# Patient Record
Sex: Female | Born: 1950 | Race: White | Hispanic: No | Smoking: Never smoker
Health system: Southern US, Community
[De-identification: ages and names within clinical notes are randomized; demographics above are authoritative.]

## PROBLEM LIST (undated history)

## (undated) DIAGNOSIS — E119 Type 2 diabetes mellitus without complications: Secondary | ICD-10-CM

## (undated) DIAGNOSIS — N200 Calculus of kidney: Secondary | ICD-10-CM

## (undated) DIAGNOSIS — Z8719 Personal history of other diseases of the digestive system: Secondary | ICD-10-CM

## (undated) DIAGNOSIS — E7800 Pure hypercholesterolemia, unspecified: Secondary | ICD-10-CM

## (undated) DIAGNOSIS — E789 Disorder of lipoprotein metabolism, unspecified: Secondary | ICD-10-CM

## (undated) DIAGNOSIS — I509 Heart failure, unspecified: Secondary | ICD-10-CM

## (undated) DIAGNOSIS — T7840XA Allergy, unspecified, initial encounter: Secondary | ICD-10-CM

## (undated) DIAGNOSIS — E785 Hyperlipidemia, unspecified: Secondary | ICD-10-CM

## (undated) DIAGNOSIS — E559 Vitamin D deficiency, unspecified: Secondary | ICD-10-CM

## (undated) DIAGNOSIS — D509 Iron deficiency anemia, unspecified: Secondary | ICD-10-CM

## (undated) DIAGNOSIS — I4510 Unspecified right bundle-branch block: Secondary | ICD-10-CM

## (undated) DIAGNOSIS — Z87442 Personal history of urinary calculi: Secondary | ICD-10-CM

## (undated) DIAGNOSIS — N182 Chronic kidney disease, stage 2 (mild): Secondary | ICD-10-CM

## (undated) DIAGNOSIS — K219 Gastro-esophageal reflux disease without esophagitis: Secondary | ICD-10-CM

## (undated) DIAGNOSIS — N1330 Unspecified hydronephrosis: Secondary | ICD-10-CM

## (undated) HISTORY — DX: Allergy, unspecified, initial encounter: T78.40XA

## (undated) HISTORY — DX: Gastro-esophageal reflux disease without esophagitis: K21.9

## (undated) HISTORY — DX: Hyperlipidemia, unspecified: E78.5

## (undated) HISTORY — PX: COLONOSCOPY: SHX174

## (undated) HISTORY — PX: ESOPHAGOGASTRODUODENOSCOPY: SHX1529

## (undated) HISTORY — DX: Type 2 diabetes mellitus without complications: E11.9

---
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
## 1978-12-20 HISTORY — PX: ABDOMINAL HYSTERECTOMY: SHX81

## 1999-11-06 ENCOUNTER — Other Ambulatory Visit: Admission: RE | Admit: 1999-11-06 | Discharge: 1999-11-06 | Payer: Self-pay | Admitting: Internal Medicine

## 1999-11-09 ENCOUNTER — Encounter: Payer: Self-pay | Admitting: Internal Medicine

## 1999-11-09 ENCOUNTER — Encounter: Admission: RE | Admit: 1999-11-09 | Discharge: 1999-11-09 | Payer: Self-pay | Admitting: Internal Medicine

## 2001-10-27 ENCOUNTER — Encounter: Admission: RE | Admit: 2001-10-27 | Discharge: 2001-10-27 | Payer: Self-pay | Admitting: General Surgery

## 2001-10-27 ENCOUNTER — Encounter: Payer: Self-pay | Admitting: General Surgery

## 2002-08-17 ENCOUNTER — Ambulatory Visit (HOSPITAL_BASED_OUTPATIENT_CLINIC_OR_DEPARTMENT_OTHER): Admission: RE | Admit: 2002-08-17 | Discharge: 2002-08-17 | Payer: Self-pay

## 2003-08-05 ENCOUNTER — Encounter: Payer: Self-pay | Admitting: General Surgery

## 2003-08-05 ENCOUNTER — Encounter: Admission: RE | Admit: 2003-08-05 | Discharge: 2003-08-05 | Payer: Self-pay | Admitting: General Surgery

## 2004-11-27 ENCOUNTER — Encounter: Admission: RE | Admit: 2004-11-27 | Discharge: 2004-11-27 | Payer: Self-pay | Admitting: General Surgery

## 2005-01-28 ENCOUNTER — Ambulatory Visit (HOSPITAL_COMMUNITY): Admission: RE | Admit: 2005-01-28 | Discharge: 2005-01-28 | Payer: Self-pay | Admitting: Gastroenterology

## 2005-12-29 ENCOUNTER — Encounter: Admission: RE | Admit: 2005-12-29 | Discharge: 2005-12-29 | Payer: Self-pay | Admitting: General Surgery

## 2007-02-01 ENCOUNTER — Encounter: Admission: RE | Admit: 2007-02-01 | Discharge: 2007-02-01 | Payer: Self-pay | Admitting: Internal Medicine

## 2008-02-26 ENCOUNTER — Ambulatory Visit: Payer: Self-pay | Admitting: Internal Medicine

## 2009-03-27 ENCOUNTER — Ambulatory Visit: Payer: Self-pay | Admitting: Internal Medicine

## 2010-04-02 ENCOUNTER — Ambulatory Visit: Payer: Self-pay | Admitting: Internal Medicine

## 2010-07-13 ENCOUNTER — Ambulatory Visit: Payer: Self-pay | Admitting: Gastroenterology

## 2010-07-14 LAB — PATHOLOGY REPORT

## 2011-05-11 ENCOUNTER — Ambulatory Visit: Payer: Self-pay | Admitting: Family Medicine

## 2011-07-03 ENCOUNTER — Emergency Department: Payer: Self-pay | Admitting: Emergency Medicine

## 2013-08-07 LAB — LIPID PANEL
Cholesterol: 241 mg/dL — AB (ref 0–200)
HDL: 50 mg/dL (ref 35–70)
LDL Cholesterol: 159 mg/dL
Triglycerides: 161 mg/dL — AB (ref 40–160)

## 2013-09-25 ENCOUNTER — Ambulatory Visit: Payer: Self-pay | Admitting: Family Medicine

## 2013-10-31 ENCOUNTER — Ambulatory Visit: Payer: Self-pay | Admitting: Gastroenterology

## 2013-10-31 LAB — HM COLONOSCOPY

## 2013-11-12 ENCOUNTER — Ambulatory Visit: Payer: Self-pay | Admitting: Gastroenterology

## 2014-08-07 LAB — HM MAMMOGRAPHY

## 2015-03-13 LAB — HEMOGLOBIN A1C: HEMOGLOBIN A1C: 6.4 % — AB (ref 4.0–6.0)

## 2015-05-06 DIAGNOSIS — Z87442 Personal history of urinary calculi: Secondary | ICD-10-CM | POA: Insufficient documentation

## 2015-05-06 DIAGNOSIS — J309 Allergic rhinitis, unspecified: Secondary | ICD-10-CM | POA: Insufficient documentation

## 2015-05-06 DIAGNOSIS — D369 Benign neoplasm, unspecified site: Secondary | ICD-10-CM | POA: Insufficient documentation

## 2015-05-06 DIAGNOSIS — Z860100 Personal history of colon polyps, unspecified: Secondary | ICD-10-CM

## 2015-05-06 DIAGNOSIS — K219 Gastro-esophageal reflux disease without esophagitis: Secondary | ICD-10-CM | POA: Insufficient documentation

## 2015-05-06 DIAGNOSIS — E7800 Pure hypercholesterolemia, unspecified: Secondary | ICD-10-CM | POA: Insufficient documentation

## 2015-05-06 DIAGNOSIS — M7710 Lateral epicondylitis, unspecified elbow: Secondary | ICD-10-CM | POA: Insufficient documentation

## 2015-05-06 DIAGNOSIS — D709 Neutropenia, unspecified: Secondary | ICD-10-CM | POA: Insufficient documentation

## 2015-05-06 DIAGNOSIS — I499 Cardiac arrhythmia, unspecified: Secondary | ICD-10-CM | POA: Insufficient documentation

## 2015-05-06 DIAGNOSIS — E119 Type 2 diabetes mellitus without complications: Secondary | ICD-10-CM | POA: Insufficient documentation

## 2015-05-06 HISTORY — DX: Personal history of colon polyps, unspecified: Z86.0100

## 2015-05-06 HISTORY — DX: Benign neoplasm, unspecified site: D36.9

## 2015-05-06 HISTORY — DX: Personal history of colonic polyps: Z86.010

## 2015-07-10 ENCOUNTER — Encounter: Payer: Self-pay | Admitting: Family Medicine

## 2015-07-10 ENCOUNTER — Ambulatory Visit (INDEPENDENT_AMBULATORY_CARE_PROVIDER_SITE_OTHER): Payer: 59 | Admitting: Family Medicine

## 2015-07-10 VITALS — BP 102/60 | HR 80 | Temp 98.3°F | Resp 16 | Wt 131.0 lb

## 2015-07-10 DIAGNOSIS — E119 Type 2 diabetes mellitus without complications: Secondary | ICD-10-CM

## 2015-07-10 DIAGNOSIS — D709 Neutropenia, unspecified: Secondary | ICD-10-CM

## 2015-07-10 DIAGNOSIS — R319 Hematuria, unspecified: Secondary | ICD-10-CM

## 2015-07-10 DIAGNOSIS — E7800 Pure hypercholesterolemia, unspecified: Secondary | ICD-10-CM

## 2015-07-10 DIAGNOSIS — J309 Allergic rhinitis, unspecified: Secondary | ICD-10-CM

## 2015-07-10 DIAGNOSIS — Z0000 Encounter for general adult medical examination without abnormal findings: Secondary | ICD-10-CM

## 2015-07-10 LAB — POCT URINALYSIS DIPSTICK
Bilirubin, UA: NEGATIVE
GLUCOSE UA: NEGATIVE
Ketones, UA: NEGATIVE
Leukocytes, UA: NEGATIVE
Nitrite, UA: NEGATIVE
PH UA: 5
PROTEIN UA: NEGATIVE
Spec Grav, UA: 1.025
UROBILINOGEN UA: 0.2

## 2015-07-10 LAB — POCT UA - MICROALBUMIN: Microalbumin Ur, POC: 20 mg/L

## 2015-07-10 NOTE — Progress Notes (Signed)
Patient ID: Marcellus Scott, female   DOB: 20-Feb-1951, 64 y.o.   MRN: [id number]        Patient: OAKLYNN STIERWALT, Female    DOB: [birth year], 64 y.o.   MRN: [id number] Visit Date: 07/10/2015  Today's Provider: Margarita Rana, MD   Chief Complaint  Patient presents with  . Annual Exam   Subjective:    Annual physical exam BRIENA SWINGLER is a 64 y.o. female who presents today for health maintenance and complete physical. She feels well. She reports exercising some. She reports she is sleeping fairly well.  -----------------------------------------------------------------  Patient Active Problem List   Diagnosis Date Noted  . Allergic rhinitis 05/06/2015  . Acid reflux 05/06/2015  . History of colon polyps 05/06/2015  . H/O renal calculi 05/06/2015  . Hypercholesteremia 05/06/2015  . Irregular cardiac rhythm 05/06/2015  . Backhand tennis elbow 05/06/2015  . Neutropenia 05/06/2015  . Diabetes mellitus, type 2 05/06/2015  . Adenomatous polyp 05/06/2015   Family History  Problem Relation Age of Onset  . Diabetes Son   . Healthy Father   . Hypertension Father   . Healthy Sister   . Healthy Brother   . Colon cancer Maternal Aunt   . Healthy Sister    History   Social History  . Marital Status: Widowed    Spouse Name: N/A  . Number of Children: 2  . Years of Education: College   Occupational History  .      Part-Time   Social History Main Topics  . Smoking status: Never Smoker   . Smokeless tobacco: Never Used  . Alcohol Use: No  . Drug Use: No  . Sexual Activity: Not on file   Other Topics Concern  . Not on file   Social History Narrative   Past Surgical History  Procedure Laterality Date  . Abdominal hysterectomy  1980  . Appendectomy  1967   Allergies  Allergen Reactions  . Codeine    Previous Medications   OMEPRAZOLE (PRILOSEC) 20 MG CAPSULE    Take by mouth.   BP 102/60 mmHg  Pulse 80  Temp(Src) 98.3 F (36.8 C) (Oral)  Resp 16  Wt 131 lb  (59.421 kg)   Review of Systems  Constitutional: Negative.   HENT: Negative.   Eyes: Negative.   Respiratory: Negative.   Cardiovascular: Negative.   Gastrointestinal: Negative.   Endocrine: Negative.   Genitourinary: Negative.   Musculoskeletal: Negative.   Skin: Negative.   Allergic/Immunologic: Negative.   Neurological: Negative.   Hematological: Negative.   Psychiatric/Behavioral: Negative.     Social History She  reports that she has never smoked. She has never used smokeless tobacco. She reports that she does not drink alcohol or use illicit drugs.  Patient Active Problem List   Diagnosis Date Noted  . Allergic rhinitis 05/06/2015  . Acid reflux 05/06/2015  . History of colon polyps 05/06/2015  . H/O renal calculi 05/06/2015  . Hypercholesteremia 05/06/2015  . Irregular cardiac rhythm 05/06/2015  . Backhand tennis elbow 05/06/2015  . Neutropenia 05/06/2015  . Diabetes mellitus, type 2 05/06/2015  . Adenomatous polyp 05/06/2015    Past Surgical History  Procedure Laterality Date  . Abdominal hysterectomy  1980  . Appendectomy  1967    Family History Her family history includes Colon cancer in her maternal aunt; Diabetes in her son; Healthy in her brother, father, sister, and sister; Hypertension in her father.    Allergies  Allergen Reactions  . Codeine  Previous Medications   OMEPRAZOLE (PRILOSEC) 20 MG CAPSULE    Take by mouth.    Patient Care Team: Margarita Rana, MD as PCP - General (Family Medicine)     Objective:   Vitals: BP 102/60 mmHg  Pulse 80  Temp(Src) 98.3 F (36.8 C) (Oral)  Resp 16  Wt 131 lb (59.421 kg)   Results for orders placed or performed in visit on 07/10/15  POCT urinalysis dipstick  Result Value Ref Range   Color, UA     Clarity, UA     Glucose, UA Negative    Bilirubin, UA Negative    Ketones, UA Negative    Spec Grav, UA 1.025    Blood, UA Small    pH, UA 5.0    Protein, UA Negative    Urobilinogen, UA  0.2    Nitrite, UA Negative    Leukocytes, UA Negative Negative  POCT UA - Microalbumin  Result Value Ref Range   Microalbumin Ur, POC 20 mg/L     Physical Exam  Constitutional: She is oriented to person, place, and time. She appears well-developed and well-nourished.  HENT:  Head: Normocephalic and atraumatic.  Right Ear: Tympanic membrane, external ear and ear canal normal.  Left Ear: Tympanic membrane, external ear and ear canal normal.  Nose: Nose normal.  Mouth/Throat: Uvula is midline, oropharynx is clear and moist and mucous membranes are normal.  Eyes: Conjunctivae, EOM and lids are normal. Pupils are equal, round, and reactive to light.  Neck: Trachea normal and normal range of motion. Neck supple. Carotid bruit is not present. No thyroid mass and no thyromegaly present.  Cardiovascular: Normal rate, regular rhythm and normal heart sounds.   Pulmonary/Chest: Effort normal and breath sounds normal.  Abdominal: Soft. Normal appearance and bowel sounds are normal. There is no hepatosplenomegaly. There is no tenderness.  Genitourinary: No breast swelling, tenderness or discharge.  Musculoskeletal: Normal range of motion.  Lymphadenopathy:    She has no cervical adenopathy.    She has no axillary adenopathy.  Neurological: She is alert and oriented to person, place, and time. She has normal strength. No cranial nerve deficit.  Skin: Skin is warm, dry and intact.  Psychiatric: She has a normal mood and affect. Her speech is normal and behavior is normal. Judgment and thought content normal. Cognition and memory are normal.   BP 102/60 mmHg  Pulse 80  Temp(Src) 98.3 F (36.8 C) (Oral)  Resp 16  Wt 131 lb (59.421 kg)    Depression Screen No flowsheet data found.    Assessment & Plan:     Routine Health Maintenance and Physical Exam  Exercise Activities and Dietary recommendations Goals    None       There is no immunization history on file for this  patient.  Health Maintenance  Topic Date Due  . FOOT EXAM  02/16/1961  . URINE MICROALBUMIN  02/16/1961  . TETANUS/TDAP  02/16/1970  . ZOSTAVAX  02/17/2011  . OPHTHALMOLOGY EXAM  07/20/2015  . INFLUENZA VACCINE  07/21/2015  . HEMOGLOBIN A1C  09/13/2015  . MAMMOGRAM  08/07/2016  . COLONOSCOPY  11/01/2023  . HIV Screening  Completed      Discussed health benefits of physical activity, and encouraged her to engage in regular exercise appropriate for her age and condition.    -------------------------------------------------------------------- Patient was seen and examined by Jerrell Belfast, MD, and note scribed by Ashley Royalty, [place].  I have reviewed the document for accuracy and completeness  and I agree with above. Jerrell Belfast, MD   Margarita Rana, MD

## 2015-07-11 LAB — URINALYSIS, ROUTINE W REFLEX MICROSCOPIC
BILIRUBIN UA: NEGATIVE
Glucose, UA: NEGATIVE
KETONES UA: NEGATIVE
Leukocytes, UA: NEGATIVE
Nitrite, UA: NEGATIVE
PH UA: 6 (ref 5.0–7.5)
PROTEIN UA: NEGATIVE
RBC UA: NEGATIVE
Specific Gravity, UA: 1.029 (ref 1.005–1.030)
Urobilinogen, Ur: 0.2 mg/dL (ref 0.2–1.0)

## 2015-07-12 LAB — CBC WITH DIFFERENTIAL/PLATELET
Basophils Absolute: 0 10*3/uL (ref 0.0–0.2)
Basos: 1 %
EOS (ABSOLUTE): 0.1 10*3/uL (ref 0.0–0.4)
Eos: 2 %
Hematocrit: 38.3 % (ref 34.0–46.6)
Hemoglobin: 12.5 g/dL (ref 11.1–15.9)
IMMATURE GRANULOCYTES: 0 %
Immature Grans (Abs): 0 10*3/uL (ref 0.0–0.1)
LYMPHS ABS: 1.5 10*3/uL (ref 0.7–3.1)
Lymphs: 45 %
MCH: 27.8 pg (ref 26.6–33.0)
MCHC: 32.6 g/dL (ref 31.5–35.7)
MCV: 85 fL (ref 79–97)
Monocytes Absolute: 0.3 10*3/uL (ref 0.1–0.9)
Monocytes: 9 %
NEUTROS PCT: 43 %
Neutrophils Absolute: 1.5 10*3/uL (ref 1.4–7.0)
PLATELETS: 243 10*3/uL (ref 150–379)
RBC: 4.49 x10E6/uL (ref 3.77–5.28)
RDW: 14.8 % (ref 12.3–15.4)
WBC: 3.4 10*3/uL (ref 3.4–10.8)

## 2015-07-12 LAB — LIPID PANEL
CHOLESTEROL TOTAL: 277 mg/dL — AB (ref 100–199)
Chol/HDL Ratio: 5.5 ratio units — ABNORMAL HIGH (ref 0.0–4.4)
HDL: 50 mg/dL (ref >39)
LDL Calculated: 190 mg/dL — ABNORMAL HIGH (ref 0–99)
TRIGLYCERIDES: 185 mg/dL — AB (ref 0–149)
VLDL Cholesterol Cal: 37 mg/dL (ref 5–40)

## 2015-07-12 LAB — COMPREHENSIVE METABOLIC PANEL
A/G RATIO: 1.3 (ref 1.1–2.5)
ALK PHOS: 53 IU/L (ref 39–117)
ALT: 10 IU/L (ref 0–32)
AST: 10 IU/L (ref 0–40)
Albumin: 4.2 g/dL (ref 3.6–4.8)
BUN/Creatinine Ratio: 23 (ref 11–26)
BUN: 18 mg/dL (ref 8–27)
Bilirubin Total: 0.3 mg/dL (ref 0.0–1.2)
CO2: 22 mmol/L (ref 18–29)
Calcium: 9.5 mg/dL (ref 8.7–10.3)
Chloride: 99 mmol/L (ref 97–108)
Creatinine, Ser: 0.78 mg/dL (ref 0.57–1.00)
GFR calc non Af Amer: 81 mL/min/{1.73_m2} (ref >59)
GFR, EST AFRICAN AMERICAN: 93 mL/min/{1.73_m2} (ref >59)
GLOBULIN, TOTAL: 3.3 g/dL (ref 1.5–4.5)
Glucose: 102 mg/dL — ABNORMAL HIGH (ref 65–99)
Potassium: 4.3 mmol/L (ref 3.5–5.2)
SODIUM: 139 mmol/L (ref 134–144)
Total Protein: 7.5 g/dL (ref 6.0–8.5)

## 2015-07-12 LAB — TSH: TSH: 1.57 u[IU]/mL (ref 0.450–4.500)

## 2015-07-12 LAB — HEMOGLOBIN A1C
Est. average glucose Bld gHb Est-mCnc: 140 mg/dL
Hgb A1c MFr Bld: 6.5 % — ABNORMAL HIGH (ref 4.8–5.6)

## 2015-07-14 ENCOUNTER — Telehealth: Payer: Self-pay

## 2015-07-14 NOTE — Telephone Encounter (Signed)
Advised pt of lab results. Pt verbally acknowledges understanding. Pt declines Statin at this time, will try OTC Fish Oil 2000mg  po bid, then recheck in 6 weeks. Pt also will work on diet and exercise. [name], CMA '

## 2015-07-14 NOTE — Telephone Encounter (Signed)
-----   Message from Margarita Rana, MD sent at 07/14/2015 10:50 AM EDT ----- Labs stable. Blood sugar at 6.5. Slightly higher than previously.  Cholesterol is 277.  Higher than previously. Now patient had trouble with statins in the past.  Please see if patient would like to try a mild statin like Pravastatin and recheck 6 weeks. Thanks.

## 2015-10-29 ENCOUNTER — Encounter: Payer: Self-pay | Admitting: Family Medicine

## 2015-10-29 ENCOUNTER — Ambulatory Visit
Admission: RE | Admit: 2015-10-29 | Discharge: 2015-10-29 | Disposition: A | Discharge status: Home / Self Care | Payer: 59 | Source: Ambulatory Visit | Attending: Family Medicine | Admitting: Family Medicine

## 2015-10-29 ENCOUNTER — Ambulatory Visit (INDEPENDENT_AMBULATORY_CARE_PROVIDER_SITE_OTHER): Payer: 59 | Admitting: Family Medicine

## 2015-10-29 VITALS — BP 102/70 | HR 80 | Temp 97.8°F | Resp 16 | Ht 61.0 in | Wt 131.0 lb

## 2015-10-29 DIAGNOSIS — R0989 Other specified symptoms and signs involving the circulatory and respiratory systems: Secondary | ICD-10-CM | POA: Diagnosis not present

## 2015-10-29 DIAGNOSIS — R0781 Pleurodynia: Secondary | ICD-10-CM

## 2015-10-29 DIAGNOSIS — R918 Other nonspecific abnormal finding of lung field: Secondary | ICD-10-CM | POA: Insufficient documentation

## 2015-10-29 MED ORDER — MELOXICAM 15 MG PO TABS: 15.0000 mg | ORAL_TABLET | Freq: Every day | ORAL | Status: DC

## 2015-10-29 NOTE — Progress Notes (Signed)
Patient ID: Holly Matthews, female   DOB: 17-Jan-1951, 64 y.o.   MRN: [id number]        Patient: Holly Matthews Female    DOB: 1951/12/13   64 y.o.   MRN: [id number] Visit Date: 10/29/2015  Today's Provider: Margarita Rana, MD   Chief Complaint  Patient presents with  . Flank Pain   Subjective:    Flank Pain This is a new problem. The current episode started in the past 7 days. The problem occurs constantly. The problem has been gradually worsening since onset. Pain location: right side of torso. The quality of the pain is described as aching. The pain does not radiate. The pain is at a severity of 2/10 (7-8 "when pushed on it"). The pain is mild. The pain is the same all the time. Exacerbated by: Laying down.  Pertinent negatives include no abdominal pain, dysuria, fever, leg pain, numbness, paresthesias or pelvic pain. She has tried NSAIDs for the symptoms. The treatment provided no relief.   No trauma. First noticed it in bed.  Has been messing with it since noticed it.  Presses it and checks it frequently.   Reflux under control.      Allergies  Allergen Reactions  . Codeine    Previous Medications   OMEGA-3 FATTY ACIDS (FISH OIL) 1000 MG CAPS    Take 2,000 mg by mouth 2 (two) times daily.   OMEPRAZOLE (PRILOSEC) 20 MG CAPSULE    Take by mouth.    Review of Systems  Constitutional: Negative for fever.  Gastrointestinal: Negative for abdominal pain.  Genitourinary: Positive for flank pain. Negative for dysuria and pelvic pain.  Musculoskeletal: Positive for myalgias.  Neurological: Negative for numbness and paresthesias.    Social History  Substance Use Topics  . Smoking status: Never Smoker   . Smokeless tobacco: Never Used  . Alcohol Use: No   Objective:   BP 102/70 mmHg  Pulse 80  Temp(Src) 97.8 F (36.6 C) (Oral)  Resp 16  Ht 5\' 1"  (1.549 m)  Wt 131 lb (59.421 kg)  BMI 24.76 kg/m2  SpO2 97%  Physical Exam  Constitutional: She is oriented to person, place,  and time. She appears well-developed and well-nourished.  Cardiovascular: Normal rate and regular rhythm.   Pulmonary/Chest: Effort normal. She has rales.  Musculoskeletal: She exhibits tenderness.  Tender in right lower ribs.   Neurological: She is alert and oriented to person, place, and time.  Psychiatric: She has a normal mood and affect. Her behavior is normal. Judgment and thought content normal.      Assessment & Plan:     1. Abnormal lung sounds New problem. Will check CXR.  Further plan pending these results.  - DG Chest 2 View; Future  2. Rib pain on right side New problem. Will start medication.Also, check Xray.   - meloxicam (MOBIC) 15 MG tablet; Take 1 tablet (15 mg total) by mouth daily.  Dispense: 30 tablet; Refill: 0 - DG Ribs Unilateral Right; Future     Margarita Rana, MD  [facility]

## 2015-10-30 ENCOUNTER — Telehealth: Payer: Self-pay

## 2015-10-30 NOTE — Telephone Encounter (Signed)
Pt advised as below. Patient verbalizes understanding and is in agreement with treatment plan.

## 2015-10-30 NOTE — Telephone Encounter (Signed)
LMTCB 10/30/2015  Thanks,   -Mickel Baas

## 2015-10-30 NOTE — Telephone Encounter (Signed)
-----   Message from Margarita Rana, MD sent at 10/30/2015  2:04 PM EST ----- No fractures. Some bronchitic changes in lungs, not sure if this is clinically significant. Would recommend repeat CXR in one week to see if changes.  Thanks- Dr. Jerilynn Mages.

## 2015-11-03 ENCOUNTER — Encounter: Payer: Self-pay | Admitting: Family Medicine

## 2015-11-06 ENCOUNTER — Telehealth: Payer: Self-pay | Admitting: Family Medicine

## 2015-11-06 ENCOUNTER — Ambulatory Visit
Admission: RE | Admit: 2015-11-06 | Discharge: 2015-11-06 | Disposition: A | Discharge status: Home / Self Care | Payer: 59 | Source: Ambulatory Visit | Attending: Family Medicine | Admitting: Family Medicine

## 2015-11-06 ENCOUNTER — Telehealth: Payer: Self-pay

## 2015-11-06 DIAGNOSIS — R0989 Other specified symptoms and signs involving the circulatory and respiratory systems: Secondary | ICD-10-CM | POA: Insufficient documentation

## 2015-11-06 NOTE — Telephone Encounter (Signed)
Left message advising pt.   Thanks,   -Boubacar Lerette  

## 2015-11-06 NOTE — Telephone Encounter (Signed)
Pt called saying that we needed to put the order for the chest xray in so she could get the xray today.  Her call back is  [phone]  Thanks teri

## 2015-11-06 NOTE — Telephone Encounter (Signed)
Ordered. Please notify patient. Thanks!

## 2015-11-06 NOTE — Telephone Encounter (Signed)
LMTCB 11/06/2015  Thanks,   -Mickel Baas

## 2015-11-06 NOTE — Telephone Encounter (Signed)
-----   Message from Margarita Rana, MD sent at 11/06/2015  4:51 PM EST ----- CXR normal. Thanks.

## 2015-11-07 NOTE — Telephone Encounter (Signed)
Pt advised as below. sd 

## 2016-04-22 ENCOUNTER — Ambulatory Visit
Admission: RE | Admit: 2016-04-22 | Discharge: 2016-04-22 | Disposition: A | Discharge status: Home / Self Care | Payer: PPO | Source: Ambulatory Visit | Attending: Family Medicine | Admitting: Family Medicine

## 2016-04-22 ENCOUNTER — Ambulatory Visit (INDEPENDENT_AMBULATORY_CARE_PROVIDER_SITE_OTHER): Payer: PPO | Admitting: Family Medicine

## 2016-04-22 ENCOUNTER — Encounter: Payer: Self-pay | Admitting: Family Medicine

## 2016-04-22 VITALS — BP 104/62 | HR 80 | Temp 98.2°F | Resp 16 | Wt 133.0 lb

## 2016-04-22 DIAGNOSIS — R0781 Pleurodynia: Secondary | ICD-10-CM | POA: Insufficient documentation

## 2016-04-22 DIAGNOSIS — E119 Type 2 diabetes mellitus without complications: Secondary | ICD-10-CM

## 2016-04-22 DIAGNOSIS — E7800 Pure hypercholesterolemia, unspecified: Secondary | ICD-10-CM

## 2016-04-22 LAB — POCT GLYCOSYLATED HEMOGLOBIN (HGB A1C)
ESTIMATED AVERAGE GLUCOSE: 146
Hemoglobin A1C: 6.7

## 2016-04-22 MED ORDER — ATORVASTATIN CALCIUM 10 MG PO TABS: 10.0000 mg | ORAL_TABLET | Freq: Every day | ORAL | Status: DC

## 2016-04-22 NOTE — Progress Notes (Signed)
Subjective:    Patient ID: Holly Matthews, female    DOB: [birth year], 65 y.o.   MRN: [id number]  Diabetes She presents for her follow-up (Last checked 07/11/2015 and was 6.5%) diabetic visit. She has type 2 diabetes mellitus. There are no hypoglycemic associated symptoms. Associated symptoms include chest pain (rib pain on right side). Pertinent negatives for diabetes include no blurred vision, no fatigue, no foot paresthesias, no foot ulcerations, no polydipsia, no polyphagia, no polyuria, no visual change, no weakness and no weight loss. Symptoms are stable. Risk factors for coronary artery disease include diabetes mellitus, dyslipidemia, post-menopausal, family history and stress. When asked about current treatments, none were reported. She is following a generally healthy diet. Exercise: not over the last few due to family stress. Home blood sugar record trend: BS not being checked. An ACE inhibitor/angiotensin II receptor blocker is not being taken.  Hyperlipidemia Recent lipid tests were reviewed and are high (Total- 277, Trig- 185, HDL- 50, LDL- 190). Associated symptoms include chest pain (rib pain on right side). Pertinent negatives include no shortness of breath. (Pt states her father just had an MI in January, and would like to discuss starting cholesterol medication.) She is currently on no antihyperlipidemic treatment.  Chest Pain: Patient complains of chest pain. Onset was 6 months ago,  And has been waxing and waning since that time. The patient describes the pain as intermittent. Patient rates pain as a 4/10 in intensity.  Pt was seen in November for this problem, and started Meloxicam for right rib pain, which seemed to improve the pain per pt. Pain has returned. CXR was ordered at [place], and was WNL.      Review of Systems  Constitutional: Negative for weight loss, diaphoresis and fatigue.  Eyes: Negative for blurred vision.  Respiratory: Negative for shortness of breath.     Cardiovascular: Positive for chest pain (rib pain on right side). Negative for palpitations and leg swelling.  Gastrointestinal: Negative for nausea and vomiting.  Endocrine: Negative for polydipsia, polyphagia and polyuria.  Neurological: Negative for weakness.   BP 104/62 mmHg  Pulse 80  Temp(Src) 98.2 F (36.8 C) (Oral)  Resp 16  Wt 133 lb (60.328 kg)   Patient Active Problem List   Diagnosis Date Noted  . Abnormal lung sounds 10/29/2015  . Rib pain on right side 10/29/2015  . Allergic rhinitis 05/06/2015  . Acid reflux 05/06/2015  . History of colon polyps 05/06/2015  . H/O renal calculi 05/06/2015  . Hypercholesteremia 05/06/2015  . Irregular cardiac rhythm 05/06/2015  . Backhand tennis elbow 05/06/2015  . Neutropenia ([place]) 05/06/2015  . Diabetes mellitus, type 2 ([place]) 05/06/2015  . Adenomatous polyp 05/06/2015   Past Medical History  Diagnosis Date  . Allergy   . Diabetes mellitus without complication ([place])   . GERD (gastroesophageal reflux disease)   . Hyperlipidemia    Current Outpatient Prescriptions on File Prior to Visit  Medication Sig  . omeprazole (PRILOSEC) 20 MG capsule Take by mouth.   No current facility-administered medications on file prior to visit.   Allergies  Allergen Reactions  . Codeine    Past Surgical History  Procedure Laterality Date  . Abdominal hysterectomy  1980  . Appendectomy  1967   Social History   Social History  . Marital Status: Widowed    Spouse Name: N/A  . Number of Children: 2  . Years of Education: College   Occupational History  .      Part-Time  Social History Main Topics  . Smoking status: Never Smoker   . Smokeless tobacco: Never Used  . Alcohol Use: No  . Drug Use: No  . Sexual Activity: Not on file   Other Topics Concern  . Not on file   Social History Narrative   Family History  Problem Relation Age of Onset  . Diabetes Son   . Healthy Father   . Hypertension Father   . Healthy Sister    . Healthy Brother   . Colon cancer Maternal Aunt   . Healthy Sister       Objective:   Physical Exam  Constitutional: She appears well-developed and well-nourished.  Cardiovascular: Normal rate and regular rhythm.   Pulmonary/Chest: Effort normal and breath sounds normal.  Musculoskeletal:   tender over right ribcage with palpation.   Psychiatric: She has a normal mood and affect. Her behavior is normal.        Assessment & Plan:  1. Type 2 diabetes mellitus without complication, without long-term current use of insulin (HCC) Worsening. Declines adding mediation at this time. Will work on D/E and FU with Holly Matthews in 4 months. - POCT glycosylated hemoglobin (Hb A1C)  2. Hypercholesteremia Pt agrees to start medication. Call 4 weeks after starting medication for lab slip. - atorvastatin (LIPITOR) 10 MG tablet; Take 1 tablet (10 mg total) by mouth daily.  Dispense: 30 tablet; Refill: 1  3. Rib pain on right side Worsening. Will order Xray as below. FU pending results. May need RUQ [place] if pain persists and Xray is negative. - DG Ribs Unilateral Right; Future    Patient seen and examined by Holly Belfast, MD, and note scribed by Holly Matthews, CMA.   I have reviewed the document for accuracy and completeness and I agree with above. Holly Belfast, MD   Holly Rana, MD

## 2016-04-23 ENCOUNTER — Telehealth: Payer: Self-pay

## 2016-04-23 NOTE — Telephone Encounter (Signed)
-----   Message from Margarita Rana, MD sent at 04/22/2016  8:36 PM EDT ----- Rib xray is normal. Thanks.

## 2016-04-23 NOTE — Telephone Encounter (Signed)
Informed pt. Will see how pain progresses before requesting RUQ [place]. [name], CMA

## 2016-05-21 ENCOUNTER — Telehealth: Payer: Self-pay | Admitting: Family Medicine

## 2016-05-21 ENCOUNTER — Other Ambulatory Visit: Payer: Self-pay

## 2016-05-21 DIAGNOSIS — E7800 Pure hypercholesterolemia, unspecified: Secondary | ICD-10-CM

## 2016-05-21 NOTE — Telephone Encounter (Signed)
Ordered labs as below.

## 2016-05-21 NOTE — Telephone Encounter (Signed)
Ok to order labs? Please advise. Thanks!

## 2016-05-21 NOTE — Telephone Encounter (Signed)
Pt is requesting a lab slip to recheck labs.  [id number]

## 2016-05-21 NOTE — Telephone Encounter (Signed)
Ok to order labs. Thanks.

## 2016-05-28 DIAGNOSIS — E7800 Pure hypercholesterolemia, unspecified: Secondary | ICD-10-CM | POA: Diagnosis not present

## 2016-05-29 LAB — LIPID PANEL
CHOLESTEROL TOTAL: 178 mg/dL (ref 100–199)
Chol/HDL Ratio: 3.4 ratio units (ref 0.0–4.4)
HDL: 52 mg/dL (ref >39)
LDL Calculated: 105 mg/dL — ABNORMAL HIGH (ref 0–99)
TRIGLYCERIDES: 104 mg/dL (ref 0–149)
VLDL CHOLESTEROL CAL: 21 mg/dL (ref 5–40)

## 2016-05-31 ENCOUNTER — Telehealth: Payer: Self-pay

## 2016-05-31 NOTE — Telephone Encounter (Signed)
lmtcb Holly Matthews, CMA  

## 2016-05-31 NOTE — Telephone Encounter (Signed)
Lmtcb. Early to add on CMP. [name], CMA

## 2016-05-31 NOTE — Telephone Encounter (Signed)
-----  Message from Margarita Rana, MD sent at 05/29/2016  6:52 AM EDT ----- Cholesterol looks great. Please add Met c if possible. Otherwise have patient return to have it checked. Thanks.

## 2016-05-31 NOTE — Telephone Encounter (Signed)
-----   Message from Margarita Rana, MD sent at 05/31/2016  3:27 PM EDT ----- Labs stable. Please notify patient. Thanks.

## 2016-06-01 LAB — COMPREHENSIVE METABOLIC PANEL
ALBUMIN: 4.3 g/dL (ref 3.6–4.8)
ALK PHOS: 60 IU/L (ref 39–117)
ALT: 17 IU/L (ref 0–32)
AST: 16 IU/L (ref 0–40)
Albumin/Globulin Ratio: 1.5 (ref 1.2–2.2)
BUN/Creatinine Ratio: 18 (ref 12–28)
BUN: 15 mg/dL (ref 8–27)
Bilirubin Total: 0.3 mg/dL (ref 0.0–1.2)
CO2: 16 mmol/L — ABNORMAL LOW (ref 18–29)
Calcium: 9.5 mg/dL (ref 8.7–10.3)
Chloride: 102 mmol/L (ref 96–106)
Creatinine, Ser: 0.84 mg/dL (ref 0.57–1.00)
GFR calc Af Amer: 84 mL/min/{1.73_m2} (ref >59)
GFR calc non Af Amer: 73 mL/min/{1.73_m2} (ref >59)
GLOBULIN, TOTAL: 2.8 g/dL (ref 1.5–4.5)
Glucose: 122 mg/dL — ABNORMAL HIGH (ref 65–99)
POTASSIUM: 4.2 mmol/L (ref 3.5–5.2)
SODIUM: 140 mmol/L (ref 134–144)
TOTAL PROTEIN: 7.1 g/dL (ref 6.0–8.5)

## 2016-06-01 LAB — SPECIMEN STATUS REPORT

## 2016-06-02 NOTE — Telephone Encounter (Signed)
Patient advised as below.  

## 2016-06-16 ENCOUNTER — Other Ambulatory Visit: Payer: Self-pay | Admitting: Family Medicine

## 2016-06-16 DIAGNOSIS — E7800 Pure hypercholesterolemia, unspecified: Secondary | ICD-10-CM

## 2016-08-26 ENCOUNTER — Ambulatory Visit: Payer: PPO | Admitting: Physician Assistant

## 2016-09-08 ENCOUNTER — Encounter: Payer: Self-pay | Admitting: Physician Assistant

## 2016-09-08 ENCOUNTER — Ambulatory Visit (INDEPENDENT_AMBULATORY_CARE_PROVIDER_SITE_OTHER): Payer: PPO | Admitting: Physician Assistant

## 2016-09-08 VITALS — BP 104/60 | HR 68 | Temp 98.2°F | Wt 133.8 lb

## 2016-09-08 DIAGNOSIS — Z1239 Encounter for other screening for malignant neoplasm of breast: Secondary | ICD-10-CM

## 2016-09-08 DIAGNOSIS — E119 Type 2 diabetes mellitus without complications: Secondary | ICD-10-CM

## 2016-09-08 LAB — POCT GLYCOSYLATED HEMOGLOBIN (HGB A1C): HEMOGLOBIN A1C: 6.3

## 2016-09-08 NOTE — Patient Instructions (Signed)

## 2016-09-08 NOTE — Progress Notes (Signed)
Patient: Holly Matthews Female    DOB: 1951-10-06   65 y.o.   MRN: [id number] Visit Date: 09/13/2016  Today's Provider: Mar Daring, PA-C   Chief Complaint  Patient presents with  . Hyperlipidemia  . Diabetes  . Follow-up   Subjective:    HPI  Diabetes Mellitus Type II, Follow-up:   Lab Results  Component Value Date   HGBA1C 6.3 09/08/2016   HGBA1C 6.7 04/22/2016   HGBA1C 6.5 (H) 07/11/2015   Last seen for diabetes 4 months ago.  Management since then includes patient declined starting a medication. Patient advised to diet and exercise. She reports fair compliance with treatment. She is not having side effects.  Current symptoms include none and have been stable. Home blood sugar records: not being checked    Current Insulin Regimen: none Weight trend: stable Current diet: low sugar Current exercise: light exercise  ------------------------------------------------------------------------    Lipid/Cholesterol, Follow-up:   Last seen for this 4 months ago.  Management since that visit includes continue Atorvastatin 10 mg.  Last Lipid Panel:    Component Value Date/Time   CHOL 178 05/28/2016 0838   TRIG 104 05/28/2016 0838   HDL 52 05/28/2016 0838   CHOLHDL 3.4 05/28/2016 0838   LDLCALC 105 (H) 05/28/2016 [id number]    She reports excellent compliance with treatment. She is not having side effects.   Wt Readings from Last 3 Encounters:  09/08/16 133 lb 12.8 oz (60.7 kg)  04/22/16 133 lb (60.3 kg)  10/29/15 131 lb (59.4 kg)    ------------------------------------------------------------------------    Previous Medications   ATORVASTATIN (LIPITOR) 10 MG TABLET    TAKE 1 TABLET BY MOUTH DAILY   OMEPRAZOLE (PRILOSEC) 20 MG CAPSULE    Take by mouth.    Review of Systems  Constitutional: Negative.   Respiratory: Negative.   Cardiovascular: Negative.   Endocrine: Negative.   Musculoskeletal: Negative.     Social History  Substance Use Topics    . Smoking status: Never Smoker  . Smokeless tobacco: Never Used  . Alcohol use No   Objective:   BP 104/60 (BP Location: Right Arm, Patient Position: Sitting, Cuff Size: Normal)   Pulse 68   Temp 98.2 F (36.8 C) (Oral)   Wt 133 lb 12.8 oz (60.7 kg)   BMI 25.28 kg/m   Physical Exam  Constitutional: She appears well-developed and well-nourished. No distress.  Neck: Normal range of motion. Neck supple. No JVD present. No tracheal deviation present. No thyromegaly present.  Cardiovascular: Normal rate, regular rhythm and normal heart sounds.  Exam reveals no gallop and no friction rub.   No murmur heard. Pulmonary/Chest: Effort normal and breath sounds normal. No respiratory distress. She has no wheezes. She has no rales.  Musculoskeletal: She exhibits no edema.  Lymphadenopathy:    She has no cervical adenopathy.  Skin: She is not diaphoretic.  Psychiatric: She has a normal mood and affect. Her behavior is normal. Judgment and thought content normal.  Vitals reviewed.      Assessment & Plan:     1. Type 2 diabetes mellitus without complication, without long-term current use of insulin (HCC) Hemoglobin A1c again increased to 6.7 from 6.5. Discussed the risk of increasing hemoglobin A1c but patient still refuses to start medication. She reports that she is going to continue to work on lifestyle modification. I will see her back in 3 months. If hemoglobin A1c increases again she knows that she will have to start medication at that  time. - POCT HgB A1C  2. Breast cancer screening Screening mammogram is due. This has been ordered as below. I will follow-up pending results. - MM Digital Screening; Future   Follow up: Return in about 3 months (around 12/08/2016) for initial welcome to medicare/annual wellness.

## 2016-11-09 DIAGNOSIS — E119 Type 2 diabetes mellitus without complications: Secondary | ICD-10-CM | POA: Diagnosis not present

## 2016-11-15 ENCOUNTER — Encounter: Payer: Self-pay | Admitting: Physician Assistant

## 2016-11-24 ENCOUNTER — Ambulatory Visit (INDEPENDENT_AMBULATORY_CARE_PROVIDER_SITE_OTHER): Payer: PPO | Admitting: Physician Assistant

## 2016-11-24 ENCOUNTER — Encounter: Payer: Self-pay | Admitting: Physician Assistant

## 2016-11-24 VITALS — BP 108/78 | HR 96 | Temp 98.2°F | Resp 16 | Ht 60.0 in | Wt 128.0 lb

## 2016-11-24 DIAGNOSIS — Z23 Encounter for immunization: Secondary | ICD-10-CM | POA: Diagnosis not present

## 2016-11-24 DIAGNOSIS — E119 Type 2 diabetes mellitus without complications: Secondary | ICD-10-CM

## 2016-11-24 DIAGNOSIS — Z1159 Encounter for screening for other viral diseases: Secondary | ICD-10-CM | POA: Diagnosis not present

## 2016-11-24 DIAGNOSIS — Z0000 Encounter for general adult medical examination without abnormal findings: Secondary | ICD-10-CM | POA: Diagnosis not present

## 2016-11-24 DIAGNOSIS — E7800 Pure hypercholesterolemia, unspecified: Secondary | ICD-10-CM

## 2016-11-24 DIAGNOSIS — D709 Neutropenia, unspecified: Secondary | ICD-10-CM | POA: Diagnosis not present

## 2016-11-24 NOTE — Patient Instructions (Signed)

## 2016-11-24 NOTE — Progress Notes (Signed)
Patient: Holly Matthews, Female    DOB: 10-07-1951, 65 y.o.   MRN: [id number] Visit Date: 11/24/2016  Today's Provider: Mar Daring, PA-C   Chief Complaint  Patient presents with  . Medicare Wellness   Subjective:    Annual wellness visit Holly Matthews is a 65 y.o. female. She feels well. She reports exercising "some" Goes to gym twice weekly 30-60 minutes. Walks on treadmill. She reports she is sleeping well.  ----------------------------------------------------------- Last colonoscopy- 10/31/2013- hyperplastic polyps. Recheck 5 years. Last mammogram- 08/07/2014. Last BMD- never per pt.  Review of Systems  Constitutional: Negative.   HENT: Negative.   Eyes: Negative.   Respiratory: Negative.   Cardiovascular: Negative.   Gastrointestinal: Negative.   Endocrine: Negative.   Genitourinary: Negative.   Musculoskeletal: Negative.   Skin: Negative.   Allergic/Immunologic: Negative.   Neurological: Negative.   Hematological: Negative.   Psychiatric/Behavioral: Negative.     Social History   Social History  . Marital status: Widowed    Spouse name: N/A  . Number of children: 2  . Years of education: College   Occupational History  .      Part-Time  .  [employer]   Social History Main Topics  . Smoking status: Never Smoker  . Smokeless tobacco: Never Used  . Alcohol use No  . Drug use: No  . Sexual activity: Not Currently   Other Topics Concern  . Not on file   Social History Narrative  . No narrative on file    Past Medical History:  Diagnosis Date  . Allergy   . Diabetes mellitus without complication ([place])   . GERD (gastroesophageal reflux disease)   . Hyperlipidemia      Patient Active Problem List   Diagnosis Date Noted  . Abnormal lung sounds 10/29/2015  . Rib pain on right side 10/29/2015  . Allergic rhinitis 05/06/2015  . Acid reflux 05/06/2015  . History of colon polyps 05/06/2015  . H/O renal calculi 05/06/2015  .  Hypercholesteremia 05/06/2015  . Irregular cardiac rhythm 05/06/2015  . Backhand tennis elbow 05/06/2015  . Neutropenia ([place]) 05/06/2015  . Diabetes mellitus, type 2 ([place]) 05/06/2015  . Adenomatous polyp 05/06/2015    Past Surgical History:  Procedure Laterality Date  . ABDOMINAL HYSTERECTOMY  1980  . APPENDECTOMY  1967    Her family history includes Colon cancer in her maternal aunt; Diabetes in her son; Healthy in her brother, father, sister, and sister; Hypertension in her father.      Current Outpatient Prescriptions:  .  atorvastatin (LIPITOR) 10 MG tablet, TAKE 1 TABLET BY MOUTH DAILY, Disp: 30 tablet, Rfl: 5 .  omeprazole (PRILOSEC) 20 MG capsule, Take by mouth., Disp: , Rfl:   Patient Care Team: Mar Daring, PA-C as PCP - General (Family Medicine)     Objective:   Vitals: BP 108/78 (BP Location: Left Arm, Patient Position: Sitting, Cuff Size: Normal)   Pulse 96   Temp 98.2 F (36.8 C) (Oral)   Resp 16   Ht 5' (1.524 m)   Wt 128 lb (58.1 kg)   BMI 25.00 kg/m   Physical Exam  Constitutional: She is oriented to person, place, and time. She appears well-developed and well-nourished. No distress.  HENT:  Head: Normocephalic and atraumatic.  Right Ear: Hearing, tympanic membrane, external ear and ear canal normal.  Left Ear: Hearing, tympanic membrane, external ear and ear canal normal.  Nose: Nose normal.  Mouth/Throat: Uvula  is midline, oropharynx is clear and moist and mucous membranes are normal. No oropharyngeal exudate.  Eyes: Conjunctivae and EOM are normal. Pupils are equal, round, and reactive to light. Right eye exhibits no discharge. Left eye exhibits no discharge. No scleral icterus.  Neck: Normal range of motion. Neck supple. No JVD present. Carotid bruit is not present. No tracheal deviation present. No thyromegaly present.  Cardiovascular: Normal rate, regular rhythm, normal heart sounds and intact distal pulses.  Exam reveals no gallop and no  friction rub.   No murmur heard. Pulmonary/Chest: Effort normal and breath sounds normal. No respiratory distress. She has no wheezes. She has no rales. She exhibits no tenderness. Right breast exhibits no inverted nipple, no mass, no nipple discharge, no skin change and no tenderness. Left breast exhibits no inverted nipple, no mass, no nipple discharge, no skin change and no tenderness. Breasts are symmetrical.  Abdominal: Soft. Bowel sounds are normal. She exhibits no distension and no mass. There is no tenderness. There is no rebound and no guarding.  Musculoskeletal: Normal range of motion. She exhibits no edema or tenderness.  Lymphadenopathy:    She has no cervical adenopathy.  Neurological: She is alert and oriented to person, place, and time.  Skin: Skin is warm and dry. No rash noted. She is not diaphoretic.  Psychiatric: She has a normal mood and affect. Her behavior is normal. Judgment and thought content normal.  Vitals reviewed.   Activities of Daily Living In your present state of health, do you have any difficulty performing the following activities: 11/24/2016  Hearing? N  Vision? N  Difficulty concentrating or making decisions? N  Walking or climbing stairs? N  Dressing or bathing? N  Doing errands, shopping? N  Some recent data might be hidden    Fall Risk Assessment Fall Risk  11/24/2016  Falls in the past year? No     Depression Screen PHQ 2/9 Scores 11/24/2016  PHQ - 2 Score 0    Cognitive Testing - 6-CIT  Correct? Score   What year is it? yes 0 0 or 4  What month is it? yes 0 0 or 3  Memorize:    Holly Matthews,  42,  [address]  [place],      What time is it? (within 1 hour) yes 0 0 or 3  Count backwards from 20 yes 0 0, 2, or 4  Name the months of the year yes 0 0, 2, or 4  Repeat name & address above yes 0 0, 2, 4, 6, 8, or 10       TOTAL SCORE  0/28   Interpretation:  Normal  Normal (0-7) Abnormal (8-28)   Audit-C Alcohol Use Screening  Question  Answer Points  How often do you have alcoholic drink? never 0  On days you do drink alcohol, how many drinks do you typically consume? 0 0  How oftey will you drink 6 or more in a total? never 0  Total Score:  0   A score of 3 or more in women, and 4 or more in men indicates increased risk for alcohol abuse, EXCEPT if all of the points are from question 1.     Assessment & Plan:     Annual Wellness Visit  Reviewed patient's Family Medical History Reviewed and updated list of patient's medical providers Assessment of cognitive impairment was done Assessed patient's functional ability Established a written schedule for health screening [place] Completed and Reviewed  Exercise Activities and Dietary recommendations Goals    None      Immunization History  Administered Date(s) Administered  . Influenza Split 10/25/2011    Health Maintenance  Topic Date Due  . Hepatitis C Screening  February 16, 1951  . FOOT EXAM  02/16/1961  . TETANUS/TDAP  02/16/1970  . PAP SMEAR  02/17/1972  . ZOSTAVAX  02/17/2011  . DEXA SCAN  02/17/2016  . PNA vac Low Risk Adult (1 of 2 - PCV13) 02/17/2016  . URINE MICROALBUMIN  07/09/2016  . INFLUENZA VACCINE  07/20/2016  . MAMMOGRAM  08/07/2016  . HEMOGLOBIN A1C  03/08/2017  . OPHTHALMOLOGY EXAM  11/09/2017  . COLONOSCOPY  11/01/2023  . HIV Screening  Completed     Discussed health benefits of physical activity, and encouraged her to engage in regular exercise appropriate for her age and condition.    1. Welcome to Medicare preventive visit EKG normal. Physical exam normal. - EKG 12-Lead  2. Need for pneumococcal vaccination Prevnar 13 vaccine given to patient without complications. Patient sat for 15 minutes after administration and was tolerated well without adverse effects. - Pneumococcal conjugate vaccine 13-valent  3. Need for influenza vaccination Flu vaccine given today without complication. Patient sat upright  for 15 minutes to check for adverse reaction before being released. - Flu vaccine HIGH DOSE PF (Fluzone High dose)  4. Type 2 diabetes mellitus without complication, without long-term current use of insulin (HCC) Previously stable. Will check labs as below and f/u pending results. - Comprehensive Metabolic Panel (CMET) - HgB A1c  5. Hypercholesteremia Will check labs as below and f/u pending results. - Comprehensive Metabolic Panel (CMET) - Lipid Profile  6. Neutropenia, unspecified type ([place]) Will check labs as below and f/u pending results. - CBC w/Diff/Platelet  7. Need for hepatitis C screening test - Hepatitis C Antibody  ------------------------------------------------------------------------------------------------------------  Patient seen and examined by Fenton Malling, PA, and note scribed by [name], CMA.   Mar Daring, PA-C  [facility]

## 2016-11-25 DIAGNOSIS — D709 Neutropenia, unspecified: Secondary | ICD-10-CM | POA: Diagnosis not present

## 2016-11-25 DIAGNOSIS — E119 Type 2 diabetes mellitus without complications: Secondary | ICD-10-CM | POA: Diagnosis not present

## 2016-11-25 DIAGNOSIS — E7800 Pure hypercholesterolemia, unspecified: Secondary | ICD-10-CM | POA: Diagnosis not present

## 2016-11-25 DIAGNOSIS — Z1159 Encounter for screening for other viral diseases: Secondary | ICD-10-CM | POA: Diagnosis not present

## 2016-11-26 ENCOUNTER — Telehealth: Payer: Self-pay | Admitting: Emergency Medicine

## 2016-11-26 DIAGNOSIS — E7800 Pure hypercholesterolemia, unspecified: Secondary | ICD-10-CM

## 2016-11-26 LAB — CBC WITH DIFFERENTIAL/PLATELET
BASOS: 1 %
Basophils Absolute: 0 10*3/uL (ref 0.0–0.2)
EOS (ABSOLUTE): 0.1 10*3/uL (ref 0.0–0.4)
EOS: 2 %
HEMATOCRIT: 39.9 % (ref 34.0–46.6)
HEMOGLOBIN: 13.4 g/dL (ref 11.1–15.9)
IMMATURE GRANULOCYTES: 0 %
Immature Grans (Abs): 0 10*3/uL (ref 0.0–0.1)
Lymphocytes Absolute: 1.1 10*3/uL (ref 0.7–3.1)
Lymphs: 28 %
MCH: 29.3 pg (ref 26.6–33.0)
MCHC: 33.6 g/dL (ref 31.5–35.7)
MCV: 87 fL (ref 79–97)
MONOCYTES: 10 %
MONOS ABS: 0.4 10*3/uL (ref 0.1–0.9)
NEUTROS PCT: 59 %
Neutrophils Absolute: 2.4 10*3/uL (ref 1.4–7.0)
Platelets: 234 10*3/uL (ref 150–379)
RBC: 4.58 x10E6/uL (ref 3.77–5.28)
RDW: 14.3 % (ref 12.3–15.4)
WBC: 4 10*3/uL (ref 3.4–10.8)

## 2016-11-26 LAB — LIPID PANEL
Chol/HDL Ratio: 3.2 ratio units (ref 0.0–4.4)
Cholesterol, Total: 162 mg/dL (ref 100–199)
HDL: 51 mg/dL (ref >39)
LDL Calculated: 86 mg/dL (ref 0–99)
Triglycerides: 123 mg/dL (ref 0–149)
VLDL Cholesterol Cal: 25 mg/dL (ref 5–40)

## 2016-11-26 LAB — COMPREHENSIVE METABOLIC PANEL
A/G RATIO: 1.5 (ref 1.2–2.2)
ALBUMIN: 4.4 g/dL (ref 3.6–4.8)
ALT: 11 IU/L (ref 0–32)
AST: 12 IU/L (ref 0–40)
Alkaline Phosphatase: 64 IU/L (ref 39–117)
BUN / CREAT RATIO: 16 (ref 12–28)
BUN: 14 mg/dL (ref 8–27)
Bilirubin Total: 0.4 mg/dL (ref 0.0–1.2)
CALCIUM: 9.8 mg/dL (ref 8.7–10.3)
CO2: 23 mmol/L (ref 18–29)
CREATININE: 0.85 mg/dL (ref 0.57–1.00)
Chloride: 102 mmol/L (ref 96–106)
GFR, EST AFRICAN AMERICAN: 83 mL/min/{1.73_m2} (ref >59)
GFR, EST NON AFRICAN AMERICAN: 72 mL/min/{1.73_m2} (ref >59)
GLOBULIN, TOTAL: 3 g/dL (ref 1.5–4.5)
Glucose: 109 mg/dL — ABNORMAL HIGH (ref 65–99)
POTASSIUM: 3.9 mmol/L (ref 3.5–5.2)
SODIUM: 140 mmol/L (ref 134–144)
TOTAL PROTEIN: 7.4 g/dL (ref 6.0–8.5)

## 2016-11-26 LAB — HEPATITIS C ANTIBODY: Hep C Virus Ab: <0.1 s/co ratio (ref 0.0–0.9)

## 2016-11-26 LAB — HEMOGLOBIN A1C
Est. average glucose Bld gHb Est-mCnc: 140 mg/dL
Hgb A1c MFr Bld: 6.5 % — ABNORMAL HIGH (ref 4.8–5.6)

## 2016-11-26 MED ORDER — ATORVASTATIN CALCIUM 10 MG PO TABS
10.0000 mg | ORAL_TABLET | Freq: Every day | ORAL | 3 refills | Status: DC
Start: 2016-11-26 — End: 2017-12-02

## 2016-11-26 NOTE — Telephone Encounter (Signed)
Pt would like to know if she needs to come back in sooner than 1 year for her other chronic problems and blood sugar, etc.. Pt also wants 90 day rx for Atorvastatin sent to [facility]. Please advise. Thanks.

## 2016-11-26 NOTE — Telephone Encounter (Signed)
LMTCB

## 2016-11-26 NOTE — Telephone Encounter (Signed)
Pt informed and voiced understanding of results. 

## 2016-11-26 NOTE — Telephone Encounter (Signed)
Patient can return in 6 months. Atorvastatin refill sent

## 2016-11-30 ENCOUNTER — Encounter: Payer: Self-pay | Admitting: Physician Assistant

## 2016-11-30 ENCOUNTER — Ambulatory Visit (INDEPENDENT_AMBULATORY_CARE_PROVIDER_SITE_OTHER): Payer: PPO | Admitting: Physician Assistant

## 2016-11-30 VITALS — BP 122/68 | HR 72 | Temp 98.2°F | Resp 16 | Wt 127.0 lb

## 2016-11-30 DIAGNOSIS — B029 Zoster without complications: Secondary | ICD-10-CM

## 2016-11-30 MED ORDER — VALACYCLOVIR HCL 1 G PO TABS: 1000.0000 mg | ORAL_TABLET | Freq: Three times a day (TID) | ORAL | 0 refills | Status: DC

## 2016-11-30 NOTE — Patient Instructions (Signed)
Shingles Shingles, which is also known as herpes zoster, is an infection that causes a painful skin rash and fluid-filled blisters. Shingles is not related to genital herpes, which is a sexually transmitted infection. Shingles only develops in people who:  Have had chickenpox.  Have received the chickenpox vaccine. (This is rare.)  What are the causes? Shingles is caused by varicella-zoster virus (VZV). This is the same virus that causes chickenpox. After exposure to VZV, the virus stays in the body in an inactive (dormant) state. Shingles develops if the virus reactivates. This can happen many years after the initial exposure to VZV. It is not known what causes this virus to reactivate. What increases the risk? People who have had chickenpox or received the chickenpox vaccine are at risk for shingles. Infection is more common in people who:  Are older than age 50.  Have a weakened defense (immune) system, such as those with HIV, AIDS, or cancer.  Are taking medicines that weaken the immune system, such as transplant medicines.  Are under great stress.  What are the signs or symptoms? Early symptoms of this condition include itching, tingling, and pain in an area on your skin. Pain may be described as burning, stabbing, or throbbing. A few days or weeks after symptoms start, a painful red rash appears, usually on one side of the body in a bandlike or beltlike pattern. The rash eventually turns into fluid-filled blisters that break open, scab over, and dry up in about 2-3 weeks. At any time during the infection, you may also develop:  A fever.  Chills.  A headache.  An upset stomach.  How is this diagnosed? This condition is diagnosed with a skin exam. Sometimes, skin or fluid samples are taken from the blisters before a diagnosis is made. These samples are examined under a microscope or sent to a lab for testing. How is this treated? There is no specific cure for this condition.  Your health care provider will probably prescribe medicines to help you manage pain, recover more quickly, and avoid long-term problems. Medicines may include:  Antiviral drugs.  Anti-inflammatory drugs.  Pain medicines.  If the area involved is on your face, you may be referred to a specialist, such as an eye doctor (ophthalmologist) or an ear, nose, and throat (ENT) doctor to help you avoid eye problems, chronic pain, or disability. Follow these instructions at home: Medicines  Take medicines only as directed by your health care provider.  Apply an anti-itch or numbing cream to the affected area as directed by your health care provider. Blister and Rash Care  Take a cool bath or apply cool compresses to the area of the rash or blisters as directed by your health care provider. This may help with pain and itching.  Keep your rash covered with a loose bandage (dressing). Wear loose-fitting clothing to help ease the pain of material rubbing against the rash.  Keep your rash and blisters clean with mild soap and cool water or as directed by your health care provider.  Check your rash every day for signs of infection. These include redness, swelling, and pain that lasts or increases.  Do not pick your blisters.  Do not scratch your rash. General instructions  Rest as directed by your health care provider.  Keep all follow-up visits as directed by your health care provider. This is important.  Until your blisters scab over, your infection can cause chickenpox in people who have never had it or been vaccinated   against it. To prevent this from happening, avoid contact with other people, especially: ? Babies. ? Pregnant women. ? Children who have eczema. ? Elderly people who have transplants. ? People who have chronic illnesses, such as leukemia or AIDS. Contact a health care provider if:  Your pain is not relieved with prescribed medicines.  Your pain does not get better after  the rash heals.  Your rash looks infected. Signs of infection include redness, swelling, and pain that lasts or increases. Get help right away if:  The rash is on your face or nose.  You have facial pain, pain around your eye area, or loss of feeling on one side of your face.  You have ear pain or you have ringing in your ear.  You have loss of taste.  Your condition gets worse. This information is not intended to replace advice given to you by your health care provider. Make sure you discuss any questions you have with your health care provider. Document Released: 12/06/2005 Document Revised: 08/01/2016 Document Reviewed: 10/17/2014 Elsevier Interactive Patient Education  2017 Elsevier Inc.  

## 2016-11-30 NOTE — Progress Notes (Signed)
       Patient: Holly Matthews Female    DOB: 10-Jul-1951   65 y.o.   MRN: [id number] Visit Date: 11/30/2016  Today's Provider: Trinna Post, PA-C   Chief Complaint  Patient presents with  . Rash    Left side of her face.  Pt is concern she has shingles.   Subjective:    Rash  This is a new problem. The current episode started today. The problem has been gradually worsening since onset. The affected locations include the face. The rash is characterized by burning, pain and redness. She was exposed to nothing. Past treatments include nothing. (History of Shingles.)   Patient is a 65 y/o female who has not gotten shingles vaccine and with a history of shingles outbreak along her face 7 years ago presenting today with a rash on her face that started near her nose and then spread. She reports she felt a tingling and burning sensation and noticed a bump along her left nostril. No fever, chills, N/V/D.     Allergies  Allergen Reactions  . Codeine      Current Outpatient Prescriptions:  .  atorvastatin (LIPITOR) 10 MG tablet, Take 1 tablet (10 mg total) by mouth daily., Disp: 90 tablet, Rfl: 3 .  omeprazole (PRILOSEC) 20 MG capsule, Take by mouth., Disp: , Rfl:   Review of Systems  Constitutional: Negative.   Skin: Positive for rash.    Social History  Substance Use Topics  . Smoking status: Never Smoker  . Smokeless tobacco: Never Used  . Alcohol use No   Objective:   BP 122/68 (BP Location: Left Arm, Patient Position: Sitting, Cuff Size: Normal)   Pulse 72   Temp 98.2 F (36.8 C) (Oral)   Resp 16   Wt 127 lb (57.6 kg)   BMI 24.80 kg/m   Physical Exam  Constitutional: She is oriented to person, place, and time. She appears well-developed and well-nourished.  HENT:  Head:    Eyes: Conjunctivae are normal. Right eye exhibits no discharge. Left eye exhibits no discharge.  Lymphadenopathy:    She has no cervical adenopathy.  Neurological: She is alert and  oriented to person, place, and time.  Skin: Skin is warm and dry. Rash noted. There is erythema.        Assessment & Plan:      Problem List Items Addressed This Visit    None    Visit Diagnoses    Herpes zoster without complication    -  Primary   Relevant Medications   valACYclovir (VALTREX) 1000 MG tablet     Patient is a 65 y/o female with symptoms concerning for herpes zoster outbreak. Will treat with Valtrex. Have counseled patient on infectious precautions.   Return if symptoms worsen or fail to improve.  There are no Patient Instructions on file for this visit.  The entirety of the information documented in the History of Present Illness, Review of Systems and Physical Exam were personally obtained by me. Portions of this information were initially documented by Ashley Royalty, CMA and reviewed by me for thoroughness and accuracy.         Trinna Post, PA-C  [facility]

## 2017-01-21 ENCOUNTER — Emergency Department: Payer: PPO

## 2017-01-21 ENCOUNTER — Encounter: Payer: Self-pay | Admitting: Emergency Medicine

## 2017-01-21 ENCOUNTER — Emergency Department
Admission: EM | Admit: 2017-01-21 | Discharge: 2017-01-21 | Disposition: A | Discharge status: Home / Self Care | Payer: PPO | Attending: Emergency Medicine | Admitting: Emergency Medicine

## 2017-01-21 DIAGNOSIS — R109 Unspecified abdominal pain: Secondary | ICD-10-CM | POA: Diagnosis not present

## 2017-01-21 DIAGNOSIS — N200 Calculus of kidney: Secondary | ICD-10-CM | POA: Insufficient documentation

## 2017-01-21 DIAGNOSIS — E119 Type 2 diabetes mellitus without complications: Secondary | ICD-10-CM | POA: Insufficient documentation

## 2017-01-21 DIAGNOSIS — Z79899 Other long term (current) drug therapy: Secondary | ICD-10-CM | POA: Diagnosis not present

## 2017-01-21 LAB — URINALYSIS, COMPLETE (UACMP) WITH MICROSCOPIC
Bilirubin Urine: NEGATIVE
Glucose, UA: NEGATIVE mg/dL
Ketones, ur: NEGATIVE mg/dL
NITRITE: NEGATIVE
PROTEIN: 30 mg/dL — AB
SPECIFIC GRAVITY, URINE: 1.018 (ref 1.005–1.030)
pH: 7 (ref 5.0–8.0)

## 2017-01-21 MED ORDER — KETOROLAC TROMETHAMINE 60 MG/2ML IM SOLN
INTRAMUSCULAR | Status: AC
Start: 2017-01-21 — End: 2017-01-21
  Administered 2017-01-21: 60 mg via INTRAMUSCULAR
  Filled 2017-01-21: qty 2

## 2017-01-21 MED ORDER — NITROFURANTOIN MONOHYD MACRO 100 MG PO CAPS: 100.0000 mg | ORAL_CAPSULE | Freq: Two times a day (BID) | ORAL | 0 refills | Status: AC

## 2017-01-21 MED ORDER — KETOROLAC TROMETHAMINE 60 MG/2ML IM SOLN
60.0000 mg | Freq: Once | INTRAMUSCULAR | Status: AC
  Administered 2017-01-21: 60 mg via INTRAMUSCULAR

## 2017-01-21 NOTE — ED Provider Notes (Signed)
[facility] Emergency Department Provider Note  ____________________________________________   I have reviewed the triage vital signs and the nursing notes.   HISTORY  Chief Complaint Flank Pain   History limited by: Not Limited   HPI Holly Matthews is a 66 y.o. female who presents to the emergency department today because of concerns for flank pain. It is located on the left side. It is located in her left flank and slightly around the front. The time of my examination she was also having pain and discomfort feeling like she needed to urinate. The patient did notice some blood in her urine today. States that she has a history of known stones in her kidneys however isn't of her past kidney stone. The patient denies any fevers. She did have some nausea.   Past Medical History:  Diagnosis Date  . Allergy   . Diabetes mellitus without complication ([place])   . GERD (gastroesophageal reflux disease)   . Hyperlipidemia     Patient Active Problem List   Diagnosis Date Noted  . Allergic rhinitis 05/06/2015  . Acid reflux 05/06/2015  . History of colon polyps 05/06/2015  . H/O renal calculi 05/06/2015  . Hypercholesteremia 05/06/2015  . Irregular cardiac rhythm 05/06/2015  . Neutropenia ([place]) 05/06/2015  . Diabetes mellitus, type 2 ([place]) 05/06/2015  . Adenomatous polyp 05/06/2015    Past Surgical History:  Procedure Laterality Date  . ABDOMINAL HYSTERECTOMY  1980  . APPENDECTOMY  1967    Prior to Admission medications   Medication Sig Start Date End Date Taking? Authorizing Provider  atorvastatin (LIPITOR) 10 MG tablet Take 1 tablet (10 mg total) by mouth daily. 11/26/16   Mar Daring, PA-C  nitrofurantoin, macrocrystal-monohydrate, (MACROBID) 100 MG capsule Take 1 capsule (100 mg total) by mouth 2 (two) times daily. 01/21/17 01/26/17  Nance Pear, MD  omeprazole (PRILOSEC) 20 MG capsule Take by mouth.    Historical Provider, MD  valACYclovir  (VALTREX) 1000 MG tablet Take 1 tablet (1,000 mg total) by mouth 3 (three) times daily. 11/30/16   Trinna Post, PA-C    Allergies Codeine  Family History  Problem Relation Age of Onset  . Diabetes Son   . Healthy Father   . Hypertension Father   . Healthy Sister   . Healthy Brother   . Colon cancer Maternal Aunt   . Healthy Sister     Social History Social History  Substance Use Topics  . Smoking status: Never Smoker  . Smokeless tobacco: Never Used  . Alcohol use No    Review of Systems  Constitutional: Negative for fever. Cardiovascular: Negative for chest pain. Respiratory: Negative for shortness of breath. Gastrointestinal: Positive for left flank pain and nausea.  Genitourinary: Negative for dysuria. Neurological: Negative for headaches, focal weakness or numbness.  10-point ROS otherwise negative.  ____________________________________________   PHYSICAL EXAM:  VITAL SIGNS: ED Triage Vitals  Enc Vitals Group     BP 01/21/17 1559 129/67     Pulse Rate 01/21/17 1559 94     Resp 01/21/17 1559 18     Temp 01/21/17 1559 98.2 F (36.8 C)     Temp Source 01/21/17 1559 Oral     SpO2 01/21/17 1559 98 %     Weight 01/21/17 1600 128 lb (58.1 kg)     Height 01/21/17 1600 5\' 1"  (1.549 m)     Head Circumference --      Peak Flow --      Pain Score  01/21/17 1600 5   Constitutional: Alert and oriented. Appears slightly uncomfortable.  Eyes: Conjunctivae are normal. Normal extraocular movements. ENT   Head: Normocephalic and atraumatic.   Nose: No congestion/rhinnorhea.   Mouth/Throat: Mucous membranes are moist.   Neck: No stridor. Hematological/Lymphatic/Immunilogical: No cervical lymphadenopathy. Cardiovascular: Normal rate, regular rhythm.  No murmurs, rubs, or gallops.  Respiratory: Normal respiratory effort without tachypnea nor retractions. Breath sounds are clear and equal bilaterally. No wheezes/rales/rhonchi. Gastrointestinal: Soft and  non tender. No rebound. No guarding.  Genitourinary: Deferred Musculoskeletal: Normal range of motion in all extremities. No lower extremity edema. Neurologic:  Normal speech and language. No gross focal neurologic deficits are appreciated.  Skin:  Skin is warm, dry and intact. No rash noted. Psychiatric: Mood and affect are normal. Speech and behavior are normal. Patient exhibits appropriate insight and judgment.  ____________________________________________    LABS (pertinent positives/negatives)  Labs Reviewed  URINALYSIS, COMPLETE (UACMP) WITH MICROSCOPIC - Abnormal; Notable for the following:       Result Value   Color, Urine YELLOW (*)    APPearance HAZY (*)    Hgb urine dipstick LARGE (*)    Protein, ur 30 (*)    Leukocytes, UA SMALL (*)    Bacteria, UA RARE (*)    Squamous Epithelial / LPF 0-5 (*)    All other components within normal limits  URINE CULTURE     ____________________________________________   EKG  None  ____________________________________________    RADIOLOGY  CT renal IMPRESSION:  1. Multiple nonobstructive calculi are noted within the collecting  systems of the kidneys bilaterally measuring up to 4 mm in the lower  pole of the right kidney. In addition, there is a 4 mm calculus  lying dependently in the lumen of the urinary bladder. No ureteral  stones or findings of urinary tract obstruction are noted at this  time.  2. Aortic atherosclerosis.  3. Additional incidental findings, as above.     ____________________________________________   PROCEDURES  Procedures  ____________________________________________   INITIAL IMPRESSION / ASSESSMENT AND PLAN / ED COURSE  Pertinent labs & imaging results that were available during my care of the patient were reviewed by me and considered in my medical decision making (see chart for details).  Patient presents for left flank pain and hematuria. Clinical history is consistent with kidney  stone. CT renal didn't show a stone in the bladder. Think likely patient recently passed a stone today her urethra. Patient was given a dose of Toradol which patient stated helped her pain. Will discharge home to follow up with urology.  ____________________________________________   FINAL CLINICAL IMPRESSION(S) / ED DIAGNOSES  Final diagnoses:  Left flank pain  Kidney stone     Note: This dictation was prepared with Dragon dictation. Any transcriptional errors that result from this process are unintentional     Nance Pear, MD 01/21/17 2230

## 2017-01-21 NOTE — Discharge Instructions (Signed)
Please seek medical attention for any high fevers, chest pain, shortness of breath, change in behavior, persistent vomiting, bloody stool or any other new or concerning symptoms.  

## 2017-01-21 NOTE — ED Triage Notes (Signed)
Pt states about an hour ago had sharp pain to left side. Pt states the pain started easing up but noticed blood in urine when she went to bathroom. Pt states she has hx of kidney stones.

## 2017-01-23 LAB — URINE CULTURE

## 2017-03-23 ENCOUNTER — Ambulatory Visit: Payer: PPO

## 2017-03-29 ENCOUNTER — Ambulatory Visit
Admission: RE | Admit: 2017-03-29 | Discharge: 2017-03-29 | Disposition: A | Discharge status: Home / Self Care | Payer: PPO | Source: Ambulatory Visit | Attending: Physician Assistant | Admitting: Physician Assistant

## 2017-03-29 DIAGNOSIS — Z1231 Encounter for screening mammogram for malignant neoplasm of breast: Secondary | ICD-10-CM | POA: Diagnosis not present

## 2017-03-29 DIAGNOSIS — Z1239 Encounter for other screening for malignant neoplasm of breast: Secondary | ICD-10-CM

## 2017-04-07 ENCOUNTER — Other Ambulatory Visit: Payer: Self-pay | Admitting: *Deleted

## 2017-04-07 ENCOUNTER — Telehealth: Payer: Self-pay

## 2017-04-07 ENCOUNTER — Inpatient Hospital Stay
Admission: RE | Admit: 2017-04-07 | Discharge: 2017-04-07 | Disposition: A | Discharge status: Home / Self Care | Payer: Self-pay | Source: Ambulatory Visit | Attending: *Deleted | Admitting: *Deleted

## 2017-04-07 DIAGNOSIS — Z9289 Personal history of other medical treatment: Secondary | ICD-10-CM

## 2017-04-07 NOTE — Progress Notes (Signed)
Advised  ED 

## 2017-04-07 NOTE — Telephone Encounter (Signed)
They are not completed yet. I can see where exam ended on 03/29/17 but report just says preliminary report.

## 2017-04-07 NOTE — Telephone Encounter (Signed)
Pt would like the results of her Mammogram.    [phone]  Thanks,   -Mickel Baas

## 2017-04-08 NOTE — Telephone Encounter (Signed)
Pt advised mammogram normal, see imaging note-aa

## 2017-07-27 ENCOUNTER — Ambulatory Visit (INDEPENDENT_AMBULATORY_CARE_PROVIDER_SITE_OTHER): Payer: PPO | Admitting: Physician Assistant

## 2017-07-27 VITALS — BP 108/62 | HR 68 | Temp 98.1°F | Resp 16 | Wt 127.0 lb

## 2017-07-27 DIAGNOSIS — E119 Type 2 diabetes mellitus without complications: Secondary | ICD-10-CM | POA: Diagnosis not present

## 2017-07-28 LAB — POCT UA - MICROALBUMIN: MICROALBUMIN (UR) POC: 20 mg/L

## 2017-07-28 LAB — POCT GLYCOSYLATED HEMOGLOBIN (HGB A1C): HEMOGLOBIN A1C: 6.7

## 2017-07-28 NOTE — Progress Notes (Addendum)
   Subjective:    Patient ID: Holly Matthews, female    DOB: Nov 08, 1951, 66 y.o.   MRN: [id number]  HPI  Holly Matthews is a 66 yr old female that comes to the office today for her 6 month f/u of T2DM. She reports she is doing well and has no complaints. She does mention that she has not kept up with her diet as well as she had wished. She is not checking her sugar at home.  Review of Systems  Constitutional: Negative.   Respiratory: Negative.   Cardiovascular: Negative.   Gastrointestinal: Negative.   Endocrine: Negative.   Neurological: Negative.        Objective:   Physical Exam  Constitutional: She appears well-developed and well-nourished. No distress.  Neck: Normal range of motion. Neck supple. No tracheal deviation present. No thyromegaly present.  Cardiovascular: Normal rate, regular rhythm and normal heart sounds.  Exam reveals no gallop and no friction rub.   No murmur heard. Pulmonary/Chest: Effort normal and breath sounds normal. No respiratory distress. She has no wheezes. She has no rales.  Musculoskeletal: She exhibits no edema.  Lymphadenopathy:    She has no cervical adenopathy.  Skin: She is not diaphoretic.  Vitals reviewed.  BP 108/62 (BP Location: Left Arm, Patient Position: Sitting, Cuff Size: Normal)   Pulse 68   Temp 98.1 F (36.7 C) (Oral)   Resp 16   Wt 127 lb (57.6 kg)   BMI 24.00 kg/m   Diabetic Foot Form - Detailed   Diabetic Foot Exam - detailed Diabetic Foot exam was performed with the following findings:  Yes 07/28/2017 12:40 PM  Visual Foot Exam completed.:  Yes  Can the patient see the bottom of their feet?:  Yes Are the shoes appropriate in style and fit?:  No Is there swelling or and abnormal foot shape?:  No Is there a claw toe deformity?:  No Is there elevated skin temparature?:  No Is there foot or ankle muscle weakness?:  No Normal Range of Motion:  Yes Pulse Foot Exam completed.:  Yes  Right posterior Tibialias:  Present Left  posterior Tibialias:  Present  Right Dorsalis Pedis:  Present Left Dorsalis Pedis:  Present  Semmes-Weinstein Monofilament Test R Site 1-Great Toe:  Pos L Site 1-Great Toe:  Pos          Assessment & Plan:   1. Type 2 diabetes mellitus without complication, without long-term current use of insulin (HCC) A1c up to 6.7 from 6.5. Microalbumin 20. Foot exam normal. Patient declines medication at this time. She is still wishing to work on lifestyle modifications. She declines diabetic education as well. I will see her back in 4 months for CPE/AWV and recheck labs.  - POCT HgB A1C - POCT UA - Microalbumin

## 2017-11-25 ENCOUNTER — Telehealth: Payer: Self-pay

## 2017-11-25 ENCOUNTER — Ambulatory Visit (INDEPENDENT_AMBULATORY_CARE_PROVIDER_SITE_OTHER): Payer: PPO

## 2017-11-25 VITALS — BP 100/60 | HR 84 | Temp 98.0°F | Ht 61.0 in | Wt 131.6 lb

## 2017-11-25 DIAGNOSIS — Z13820 Encounter for screening for osteoporosis: Secondary | ICD-10-CM | POA: Diagnosis not present

## 2017-11-25 DIAGNOSIS — Z0000 Encounter for general adult medical examination without abnormal findings: Secondary | ICD-10-CM | POA: Diagnosis not present

## 2017-11-25 DIAGNOSIS — Z23 Encounter for immunization: Secondary | ICD-10-CM

## 2017-11-25 NOTE — Telephone Encounter (Signed)
LMTCB- regarding the Pneumovax 23.

## 2017-11-25 NOTE — Patient Instructions (Signed)
Holly Matthews , Thank you for taking time to come for your Medicare Wellness Visit. I appreciate your ongoing commitment to your health goals. Please review the following plan we discussed and let me know if I can assist you in the future.   Screening recommendations/referrals: Colonoscopy: up to date Mammogram: up to date Bone Density: order placed today Recommended yearly ophthalmology/optometry visit for glaucoma screening and checkup Recommended yearly dental visit for hygiene and checkup  Vaccinations: Influenza vaccine: completed Pneumococcal vaccine: Prevnar 13 completed, pt declined the Pneumovax 23 today Tdap vaccine: declined Shingles vaccine: declined    Advanced directives: Advance directive discussed with you today. Even though you declined this today please call our office should you change your mind and we can give you the proper paperwork for you to fill out.  Conditions/risks identified: Recommend to start back exercising for 3 days a week for 30-45 minutes.    Next appointment: 12/02/17 @ 3:00 PM   Preventive Care 65 Years and Older, Female Preventive care refers to lifestyle choices and visits with your health care provider that can promote health and wellness. What does preventive care include?  A yearly physical exam. This is also called an annual well check.  Dental exams once or twice a year.  Routine eye exams. Ask your health care provider how often you should have your eyes checked.  Personal lifestyle choices, including:  Daily care of your teeth and gums.  Regular physical activity.  Eating a healthy diet.  Avoiding tobacco and drug use.  Limiting alcohol use.  Practicing safe sex.  Taking low-dose aspirin every day.  Taking vitamin and mineral supplements as recommended by your health care provider. What happens during an annual well check? The services and screenings done by your health care provider during your annual well check will  depend on your age, overall health, lifestyle risk factors, and family history of disease. Counseling  Your health care provider may ask you questions about your:  Alcohol use.  Tobacco use.  Drug use.  Emotional well-being.  Home and relationship well-being.  Sexual activity.  Eating habits.  History of falls.  Memory and ability to understand (cognition).  Work and work Statistician.  Reproductive health. Screening  You may have the following tests or measurements:  Height, weight, and BMI.  Blood pressure.  Lipid and cholesterol levels. These may be checked every 5 years, or more frequently if you are over 21 years old.  Skin check.  Lung cancer screening. You may have this screening every year starting at age 58 if you have a 30-pack-year history of smoking and currently smoke or have quit within the past 15 years.  Fecal occult blood test (FOBT) of the stool. You may have this test every year starting at age 52.  Flexible sigmoidoscopy or colonoscopy. You may have a sigmoidoscopy every 5 years or a colonoscopy every 10 years starting at age 78.  Hepatitis C blood test.  Hepatitis B blood test.  Sexually transmitted disease (STD) testing.  Diabetes screening. This is done by checking your blood sugar (glucose) after you have not eaten for a while (fasting). You may have this done every 1-3 years.  Bone density scan. This is done to screen for osteoporosis. You may have this done starting at age 14.  Mammogram. This may be done every 1-2 years. Talk to your health care provider about how often you should have regular mammograms. Talk with your health care provider about your test results, treatment  options, and if necessary, the need for more tests. Vaccines  Your health care provider may recommend certain vaccines, such as:  Influenza vaccine. This is recommended every year.  Tetanus, diphtheria, and acellular pertussis (Tdap, Td) vaccine. You may need a  Td booster every 10 years.  Zoster vaccine. You may need this after age 55.  Pneumococcal 13-valent conjugate (PCV13) vaccine. One dose is recommended after age 11.  Pneumococcal polysaccharide (PPSV23) vaccine. One dose is recommended after age 5. Talk to your health care provider about which screenings and vaccines you need and how often you need them. This information is not intended to replace advice given to you by your health care provider. Make sure you discuss any questions you have with your health care provider. Document Released: 01/02/2016 Document Revised: 08/25/2016 Document Reviewed: 10/07/2015 Elsevier Interactive Patient Education  2017 [place] Prevention in the Home Falls can cause injuries. They can happen to people of all ages. There are many things you can do to make your home safe and to help prevent falls. What can I do on the outside of my home?  Regularly fix the edges of walkways and driveways and fix any cracks.  Remove anything that might make you trip as you walk through a door, such as a raised step or threshold.  Trim any bushes or trees on the path to your home.  Use bright outdoor lighting.  Clear any walking paths of anything that might make someone trip, such as rocks or tools.  Regularly check to see if handrails are loose or broken. Make sure that both sides of any steps have handrails.  Any raised decks and porches should have guardrails on the edges.  Have any leaves, snow, or ice cleared regularly.  Use sand or salt on walking paths during winter.  Clean up any spills in your garage right away. This includes oil or grease spills. What can I do in the bathroom?  Use night lights.  Install grab bars by the toilet and in the tub and shower. Do not use towel bars as grab bars.  Use non-skid mats or decals in the tub or shower.  If you need to sit down in the shower, use a plastic, non-slip stool.  Keep the floor dry. Clean  up any water that spills on the floor as soon as it happens.  Remove soap buildup in the tub or shower regularly.  Attach bath mats securely with double-sided non-slip rug tape.  Do not have throw rugs and other things on the floor that can make you trip. What can I do in the bedroom?  Use night lights.  Make sure that you have a light by your bed that is easy to reach.  Do not use any sheets or blankets that are too big for your bed. They should not hang down onto the floor.  Have a firm chair that has side arms. You can use this for support while you get dressed.  Do not have throw rugs and other things on the floor that can make you trip. What can I do in the kitchen?  Clean up any spills right away.  Avoid walking on wet floors.  Keep items that you use a lot in easy-to-reach places.  If you need to reach something above you, use a strong step stool that has a grab bar.  Keep electrical cords out of the way.  Do not use floor polish or wax that makes floors slippery.  If you must use wax, use non-skid floor wax.  Do not have throw rugs and other things on the floor that can make you trip. What can I do with my stairs?  Do not leave any items on the stairs.  Make sure that there are handrails on both sides of the stairs and use them. Fix handrails that are broken or loose. Make sure that handrails are as long as the stairways.  Check any carpeting to make sure that it is firmly attached to the stairs. Fix any carpet that is loose or worn.  Avoid having throw rugs at the top or bottom of the stairs. If you do have throw rugs, attach them to the floor with carpet tape.  Make sure that you have a light switch at the top of the stairs and the bottom of the stairs. If you do not have them, ask someone to add them for you. What else can I do to help prevent falls?  Wear shoes that:  Do not have high heels.  Have rubber bottoms.  Are comfortable and fit you well.  Are  closed at the toe. Do not wear sandals.  If you use a stepladder:  Make sure that it is fully opened. Do not climb a closed stepladder.  Make sure that both sides of the stepladder are locked into place.  Ask someone to hold it for you, if possible.  Clearly mark and make sure that you can see:  Any grab bars or handrails.  First and last steps.  Where the edge of each step is.  Use tools that help you move around (mobility aids) if they are needed. These include:  Canes.  Walkers.  Scooters.  Crutches.  Turn on the lights when you go into a dark area. Replace any light bulbs as soon as they burn out.  Set up your furniture so you have a clear path. Avoid moving your furniture around.  If any of your floors are uneven, fix them.  If there are any pets around you, be aware of where they are.  Review your medicines with your doctor. Some medicines can make you feel dizzy. This can increase your chance of falling. Ask your doctor what other things that you can do to help prevent falls. This information is not intended to replace advice given to you by your health care provider. Make sure you discuss any questions you have with your health care provider. Document Released: 10/02/2009 Document Revised: 05/13/2016 Document Reviewed: 01/10/2015 Elsevier Interactive Patient Education  2017 [employer].

## 2017-11-25 NOTE — Addendum Note (Signed)
Addended by: Mar Daring on: 11/25/2017 03:22 PM   Modules accepted: Level of Service

## 2017-11-25 NOTE — Progress Notes (Signed)
Subjective:   Holly Matthews is a 66 y.o. female who presents for an Initial Medicare Annual Wellness Visit.  Review of Systems    N/A  Cardiac Risk Factors include: advanced age (>66men, >54 women);diabetes mellitus;dyslipidemia     Objective:    Today's Vitals   11/25/17 1438 11/25/17 1446  BP: 100/60   Pulse: 84   Temp: 98 F (36.7 C)   TempSrc: Oral   Weight: 131 lb 9.6 oz (59.7 kg)   Height: 5\' 1"  (1.549 m)   PainSc: 0-No pain 0-No pain   Body mass index is 24.87 kg/m.  Advanced Directives 11/25/2017 01/21/2017 11/24/2016 04/22/2016  Does Patient Have a Medical Advance Directive? No No No No  Would patient like information on creating a medical advance directive? No - Patient declined - - -    Current Medications (verified) Outpatient Encounter Medications as of 11/25/2017  Medication Sig  . atorvastatin (LIPITOR) 10 MG tablet Take 1 tablet (10 mg total) by mouth daily.  Marland Kitchen omeprazole (PRILOSEC) 20 MG capsule Take 20 mg by mouth daily.   . [DISCONTINUED] valACYclovir (VALTREX) 1000 MG tablet Take 1 tablet (1,000 mg total) by mouth 3 (three) times daily.   No facility-administered encounter medications on file as of 11/25/2017.     Allergies (verified) Codeine   History: Past Medical History:  Diagnosis Date  . Allergy   . Diabetes mellitus without complication ([place])   . GERD (gastroesophageal reflux disease)   . Hyperlipidemia    Past Surgical History:  Procedure Laterality Date  . ABDOMINAL HYSTERECTOMY  1980  . APPENDECTOMY  1967   Family History  Problem Relation Age of Onset  . Diabetes Son   . Hypertension Father   . CVA Father   . Healthy Sister   . Healthy Brother   . Colon cancer Maternal Aunt   . Healthy Sister    Social History   Socioeconomic History  . Marital status: Widowed    Spouse name: None  . Number of children: 2  . Years of education: College  . Highest education level: None  Social Needs  . Financial resource strain:  None  . Food insecurity - worry: None  . Food insecurity - inability: None  . Transportation needs - medical: None  . Transportation needs - non-medical: None  Occupational History    Comment: Part-Time    Employer: [employer]  Tobacco Use  . Smoking status: Never Smoker  . Smokeless tobacco: Never Used  Substance and Sexual Activity  . Alcohol use: No  . Drug use: No  . Sexual activity: Not Currently  Other Topics Concern  . None  Social History Narrative  . None    Tobacco Counseling Counseling given: Not Answered   Clinical Intake:  Pre-visit preparation completed: Yes  Pain : No/denies pain Pain Score: 0-No pain     Nutritional Status: BMI of 19-24  Normal Nutritional Risks: None Diabetes: Yes CBG done?: No Did pt. bring in CBG monitor from home?: No  Activities of Daily Living: Independent Ambulation: Independent with device- listed below Home Assistive Devices/Equipment: Eyeglasses Medication Administration: Independent Home Management: Independent  Barriers to Care Management & Learning: None  Do you feel unsafe in your current relationship?: No(widowed) Do you feel physically threatened by others?: No Anyone hurting you at home, work, or school?: No Unable to ask?: No Information provided on Community resources: No  How often do you need to have someone help you when you read instructions,  pamphlets, or other written materials from your doctor or pharmacy?: 1 - Never What is the last grade level you completed in school?: 2 years college  Interpreter Needed?: No  Information entered by :: [facility], LPN   Activities of Daily Living In your present state of health, do you have any difficulty performing the following activities: 11/25/2017  Hearing? N  Vision? N  Difficulty concentrating or making decisions? N  Walking or climbing stairs? N  Dressing or bathing? N  Doing errands, shopping? N  Preparing Food and eating ? N  Using the Toilet?  N  In the past six months, have you accidently leaked urine? N  Do you have problems with loss of bowel control? N  Managing your Medications? N  Managing your Finances? N  Housekeeping or managing your Housekeeping? N  Some recent data might be hidden    Immunizations and Health Maintenance Immunization History  Administered Date(s) Administered  . Influenza Split 10/25/2011  . Influenza, High Dose Seasonal PF 11/24/2016, 11/25/2017  . Pneumococcal Conjugate-13 11/24/2016   Health Maintenance Due  Topic Date Due  . TETANUS/TDAP  02/16/1970  . DEXA SCAN  02/17/2016  . INFLUENZA VACCINE  07/20/2017  . OPHTHALMOLOGY EXAM  11/09/2017  . PNA vac Low Risk Adult (2 of 2 - PPSV23) 11/24/2017    Patient Care Team: Mar Daring, PA-C as PCP - General (Family Medicine)  Indicate any recent Medical Services you may have received from other than [facility] providers in the past year (date may be approximate).     Assessment:   This is a routine wellness examination for Holly Matthews.   Hearing/Vision screen Vision Screening Comments: Pt sees Dr Sandra Cockayne for vision checks yearly.   Dietary issues and exercise activities discussed: Current Exercise Habits: The patient does not participate in regular exercise at present, Exercise limited by: Other - see comments(has been busy the last month)  Goals    . Exercise 3x per week (30 min per time)     Recommend to start back exercising for 3 days a week for 30-45 minutes.       Depression Screen PHQ 2/9 Scores 11/25/2017 11/24/2016  PHQ - 2 Score 0 0    Fall Risk Fall Risk  11/25/2017 11/24/2016  Falls in the past year? No No    Is the patient's home free of loose throw rugs in walkways, pet beds, electrical cords, etc?   yes      Grab bars in the bathroom? no      Handrails on the stairs?   no      Adequate lighting?   yes  Cognitive Function:        Screening Tests Health Maintenance  Topic Date Due  . TETANUS/TDAP   02/16/1970  . DEXA SCAN  02/17/2016  . INFLUENZA VACCINE  07/20/2017  . OPHTHALMOLOGY EXAM  11/09/2017  . PNA vac Low Risk Adult (2 of 2 - PPSV23) 11/24/2017  . HEMOGLOBIN A1C  01/28/2018  . FOOT EXAM  07/27/2018  . URINE MICROALBUMIN  07/28/2018  . MAMMOGRAM  03/30/2019  . COLONOSCOPY  11/01/2023  . Hepatitis C Screening  Completed    Cancer Screenings: Lung: Low Dose CT Chest recommended if Age 67-80 years, 30 pack-year currently smoking OR have quit w/in 15years. Patient does not qualify. Breast: Up to date on Mammogram? Yes  Up to date of Bone Density/Dexa? No, order placed today.  Colorectal: up to date  Additional Screenings:  Hepatitis B/HIV/Syphillis: Pt declined  today Hepatitis C Screening: Pt declined today      Plan:  I have personally reviewed and addressed the Medicare Annual Wellness questionnaire and have noted the following in the patient's chart:  A. Medical and social history B. Use of alcohol, tobacco or illicit drugs  C. Current medications and supplements D. Functional ability and status E.  Nutritional status F.  Physical activity G. Advance directives H. List of other physicians I.  Hospitalizations, surgeries, and ER visits in previous 12 months J.  [place] such as hearing and vision if needed, cognitive and depression L. Referrals and appointments - none  In addition, I have reviewed and discussed with patient certain preventive protocols, quality metrics, and best practice recommendations. A written personalized care plan for preventive services as well as general preventive health recommendations were provided to patient.  See attached scanned questionnaire for additional information.   Signed,  Fabio Neighbors, LPN Nurse Health Advisor   Nurse Recommendations: Pt declined the Pneumovax 23 today and states she does not want that an the flu shot on the same day. Pt would like to receive this at next OV on 12/02/17. Order placed  for DEXA and pt to f/u with PCP with additional questions.

## 2017-12-02 ENCOUNTER — Encounter: Payer: Self-pay | Admitting: Physician Assistant

## 2017-12-02 ENCOUNTER — Ambulatory Visit (INDEPENDENT_AMBULATORY_CARE_PROVIDER_SITE_OTHER): Payer: PPO | Admitting: Physician Assistant

## 2017-12-02 VITALS — BP 120/76 | HR 88 | Temp 98.4°F | Resp 16 | Ht 61.0 in | Wt 132.0 lb

## 2017-12-02 DIAGNOSIS — E119 Type 2 diabetes mellitus without complications: Secondary | ICD-10-CM | POA: Diagnosis not present

## 2017-12-02 DIAGNOSIS — K219 Gastro-esophageal reflux disease without esophagitis: Secondary | ICD-10-CM

## 2017-12-02 DIAGNOSIS — D709 Neutropenia, unspecified: Secondary | ICD-10-CM

## 2017-12-02 DIAGNOSIS — Z1231 Encounter for screening mammogram for malignant neoplasm of breast: Secondary | ICD-10-CM | POA: Diagnosis not present

## 2017-12-02 DIAGNOSIS — Z0000 Encounter for general adult medical examination without abnormal findings: Secondary | ICD-10-CM

## 2017-12-02 DIAGNOSIS — Z1239 Encounter for other screening for malignant neoplasm of breast: Secondary | ICD-10-CM

## 2017-12-02 DIAGNOSIS — E7800 Pure hypercholesterolemia, unspecified: Secondary | ICD-10-CM | POA: Diagnosis not present

## 2017-12-02 LAB — POCT GLYCOSYLATED HEMOGLOBIN (HGB A1C)
Est. average glucose Bld gHb Est-mCnc: 140
Hemoglobin A1C: 6.5

## 2017-12-02 MED ORDER — ATORVASTATIN CALCIUM 10 MG PO TABS: 10.0000 mg | ORAL_TABLET | Freq: Every day | ORAL | 3 refills | Status: DC

## 2017-12-02 MED ORDER — OMEPRAZOLE 20 MG PO CPDR: 20.0000 mg | DELAYED_RELEASE_CAPSULE | Freq: Every day | ORAL | 1 refills | Status: DC

## 2017-12-02 NOTE — Patient Instructions (Signed)
Health Maintenance for Postmenopausal Women Menopause is a normal process in which your reproductive ability comes to an end. This process happens gradually over a span of months to years, usually between the ages of 22 and 9. Menopause is complete when you have missed 12 consecutive menstrual periods. It is important to talk with your health care provider about some of the most common conditions that affect postmenopausal women, such as heart disease, cancer, and bone loss (osteoporosis). Adopting a healthy lifestyle and getting preventive care can help to promote your health and wellness. Those actions can also lower your chances of developing some of these common conditions. What should I know about menopause? During menopause, you may experience a number of symptoms, such as:  Moderate-to-severe hot flashes.  Night sweats.  Decrease in sex drive.  Mood swings.  Headaches.  Tiredness.  Irritability.  Memory problems.  Insomnia.  Choosing to treat or not to treat menopausal changes is an individual decision that you make with your health care provider. What should I know about hormone replacement therapy and supplements? Hormone therapy products are effective for treating symptoms that are associated with menopause, such as hot flashes and night sweats. Hormone replacement carries certain risks, especially as you become older. If you are thinking about using estrogen or estrogen with progestin treatments, discuss the benefits and risks with your health care provider. What should I know about heart disease and stroke? Heart disease, heart attack, and stroke become more likely as you age. This may be due, in part, to the hormonal changes that your body experiences during menopause. These can affect how your body processes dietary fats, triglycerides, and cholesterol. Heart attack and stroke are both medical emergencies. There are many things that you can do to help prevent heart disease  and stroke:  Have your blood pressure checked at least every 1-2 years. High blood pressure causes heart disease and increases the risk of stroke.  If you are 53-22 years old, ask your health care provider if you should take aspirin to prevent a heart attack or a stroke.  Do not use any tobacco products, including cigarettes, chewing tobacco, or electronic cigarettes. If you need help quitting, ask your health care provider.  It is important to eat a healthy diet and maintain a healthy weight. ? Be sure to include plenty of vegetables, fruits, low-fat dairy products, and lean protein. ? Avoid eating foods that are high in solid fats, added sugars, or salt (sodium).  Get regular exercise. This is one of the most important things that you can do for your health. ? Try to exercise for at least 150 minutes each week. The type of exercise that you do should increase your heart rate and make you sweat. This is known as moderate-intensity exercise. ? Try to do strengthening exercises at least twice each week. Do these in addition to the moderate-intensity exercise.  Know your numbers.Ask your health care provider to check your cholesterol and your blood glucose. Continue to have your blood tested as directed by your health care provider.  What should I know about cancer screening? There are several types of cancer. Take the following steps to reduce your risk and to catch any cancer development as early as possible. Breast Cancer  Practice breast self-awareness. ? This means understanding how your breasts normally appear and feel. ? It also means doing regular breast self-exams. Let your health care provider know about any changes, no matter how small.  If you are 40  or older, have a clinician do a breast exam (clinical breast exam or CBE) every year. Depending on your age, family history, and medical history, it may be recommended that you also have a yearly breast X-ray (mammogram).  If you  have a family history of breast cancer, talk with your health care provider about genetic screening.  If you are at high risk for breast cancer, talk with your health care provider about having an MRI and a mammogram every year.  Breast cancer (BRCA) gene test is recommended for women who have family members with BRCA-related cancers. Results of the assessment will determine the need for genetic counseling and BRCA1 and for BRCA2 testing. BRCA-related cancers include these types: ? Breast. This occurs in males or females. ? Ovarian. ? Tubal. This may also be called fallopian tube cancer. ? Cancer of the abdominal or pelvic lining (peritoneal cancer). ? Prostate. ? Pancreatic.  Cervical, Uterine, and Ovarian Cancer Your health care provider may recommend that you be screened regularly for cancer of the pelvic organs. These include your ovaries, uterus, and vagina. This screening involves a pelvic exam, which includes checking for microscopic changes to the surface of your cervix (Pap test).  For women ages 21-65, health care providers may recommend a pelvic exam and a Pap test every three years. For women ages 79-65, they may recommend the Pap test and pelvic exam, combined with testing for human papilloma virus (HPV), every five years. Some types of HPV increase your risk of cervical cancer. Testing for HPV may also be done on women of any age who have unclear Pap test results.  Other health care providers may not recommend any screening for nonpregnant women who are considered low risk for pelvic cancer and have no symptoms. Ask your health care provider if a screening pelvic exam is right for you.  If you have had past treatment for cervical cancer or a condition that could lead to cancer, you need Pap tests and screening for cancer for at least 20 years after your treatment. If Pap tests have been discontinued for you, your risk factors (such as having a new sexual partner) need to be  reassessed to determine if you should start having screenings again. Some women have medical problems that increase the chance of getting cervical cancer. In these cases, your health care provider may recommend that you have screening and Pap tests more often.  If you have a family history of uterine cancer or ovarian cancer, talk with your health care provider about genetic screening.  If you have vaginal bleeding after reaching menopause, tell your health care provider.  There are currently no reliable tests available to screen for ovarian cancer.  Lung Cancer Lung cancer screening is recommended for adults 69-62 years old who are at high risk for lung cancer because of a history of smoking. A yearly low-dose CT scan of the lungs is recommended if you:  Currently smoke.  Have a history of at least 30 pack-years of smoking and you currently smoke or have quit within the past 15 years. A pack-year is smoking an average of one pack of cigarettes per day for one year.  Yearly screening should:  Continue until it has been 15 years since you quit.  Stop if you develop a health problem that would prevent you from having lung cancer treatment.  Colorectal Cancer  This type of cancer can be detected and can often be prevented.  Routine colorectal cancer screening usually begins at  age 42 and continues through age 45.  If you have risk factors for colon cancer, your health care provider may recommend that you be screened at an earlier age.  If you have a family history of colorectal cancer, talk with your health care provider about genetic screening.  Your health care provider may also recommend using home test kits to check for hidden blood in your stool.  A small camera at the end of a tube can be used to examine your colon directly (sigmoidoscopy or colonoscopy). This is done to check for the earliest forms of colorectal cancer.  Direct examination of the colon should be repeated every  5-10 years until age 71. However, if early forms of precancerous polyps or small growths are found or if you have a family history or genetic risk for colorectal cancer, you may need to be screened more often.  Skin Cancer  Check your skin from head to toe regularly.  Monitor any moles. Be sure to tell your health care provider: ? About any new moles or changes in moles, especially if there is a change in a mole's shape or color. ? If you have a mole that is larger than the size of a pencil eraser.  If any of your family members has a history of skin cancer, especially at a young age, talk with your health care provider about genetic screening.  Always use sunscreen. Apply sunscreen liberally and repeatedly throughout the day.  Whenever you are outside, protect yourself by wearing long sleeves, pants, a wide-brimmed hat, and sunglasses.  What should I know about osteoporosis? Osteoporosis is a condition in which bone destruction happens more quickly than new bone creation. After menopause, you may be at an increased risk for osteoporosis. To help prevent osteoporosis or the bone fractures that can happen because of osteoporosis, the following is recommended:  If you are 46-71 years old, get at least 1,000 mg of calcium and at least 600 mg of vitamin D per day.  If you are older than age 55 but younger than age 65, get at least 1,200 mg of calcium and at least 600 mg of vitamin D per day.  If you are older than age 54, get at least 1,200 mg of calcium and at least 800 mg of vitamin D per day.  Smoking and excessive alcohol intake increase the risk of osteoporosis. Eat foods that are rich in calcium and vitamin D, and do weight-bearing exercises several times each week as directed by your health care provider. What should I know about how menopause affects my mental health? Depression may occur at any age, but it is more common as you become older. Common symptoms of depression  include:  Low or sad mood.  Changes in sleep patterns.  Changes in appetite or eating patterns.  Feeling an overall lack of motivation or enjoyment of activities that you previously enjoyed.  Frequent crying spells.  Talk with your health care provider if you think that you are experiencing depression. What should I know about immunizations? It is important that you get and maintain your immunizations. These include:  Tetanus, diphtheria, and pertussis (Tdap) booster vaccine.  Influenza every year before the flu season begins.  Pneumonia vaccine.  Shingles vaccine.  Your health care provider may also recommend other immunizations. This information is not intended to replace advice given to you by your health care provider. Make sure you discuss any questions you have with your health care provider. Document Released: 01/28/2006  Document Revised: 06/25/2016 Document Reviewed: 09/09/2015 Elsevier Interactive Patient Education  2018 Elsevier Inc.  

## 2017-12-02 NOTE — Progress Notes (Signed)
Patient: Holly Matthews, Female    DOB: 01/06/1951, 66 y.o.   MRN: [id number] Visit Date: 12/02/2017  Today's Provider: Mar Daring, PA-C   Chief Complaint  Patient presents with  . Medicare Wellness   Subjective:    Annual physical exam Holly Matthews is a 66 y.o. female who presents today for health maintenance and complete physical. She feels well. She reports exercising 3 times a week. She reports she is sleeping well.  Colonoscopy: 10/2013 with Dr. Gustavo Lah to be repeated in 5 years (2019).  ----------------------------------------------------------------- Patient requesting prescription for omeprazole 20 mg daily. Patient reports that her insurance will start to cover her medication in January.   Review of Systems  Constitutional: Negative.   HENT: Negative.   Eyes: Negative.   Respiratory: Negative.   Cardiovascular: Negative.   Gastrointestinal: Negative.   Endocrine: Negative.   Genitourinary: Negative.   Musculoskeletal: Negative.   Skin: Negative.   Allergic/Immunologic: Negative.   Neurological: Negative.   Psychiatric/Behavioral: Negative.     Social History      She  reports that  has never smoked. she has never used smokeless tobacco. She reports that she does not drink alcohol or use drugs.       Social History   Socioeconomic History  . Marital status: Widowed    Spouse name: None  . Number of children: 2  . Years of education: College  . Highest education level: None  Social Needs  . Financial resource strain: None  . Food insecurity - worry: None  . Food insecurity - inability: None  . Transportation needs - medical: None  . Transportation needs - non-medical: None  Occupational History    Comment: Part-Time    Employer: [employer]  Tobacco Use  . Smoking status: Never Smoker  . Smokeless tobacco: Never Used  Substance and Sexual Activity  . Alcohol use: No  . Drug use: No  . Sexual activity: Not Currently  Other  Topics Concern  . None  Social History Narrative  . None    Past Medical History:  Diagnosis Date  . Allergy   . Diabetes mellitus without complication ([place])   . GERD (gastroesophageal reflux disease)   . Hyperlipidemia      Patient Active Problem List   Diagnosis Date Noted  . Allergic rhinitis 05/06/2015  . Acid reflux 05/06/2015  . History of colon polyps 05/06/2015  . H/O renal calculi 05/06/2015  . Hypercholesteremia 05/06/2015  . Irregular cardiac rhythm 05/06/2015  . Neutropenia ([place]) 05/06/2015  . Diabetes mellitus, type 2 ([place]) 05/06/2015  . Adenomatous polyp 05/06/2015    Past Surgical History:  Procedure Laterality Date  . ABDOMINAL HYSTERECTOMY  1980  . [place] History        Family Status  Relation Name Status  . Son  Alive  . Mother  Deceased       [place] (04/2012)  . Father  Alive  . Sister  Alive  . Brother  Alive  . Mat Aunt  Deceased  . Sister  Alive        Her family history includes CVA in her father; Colon cancer in her maternal aunt; Diabetes in her son; Healthy in her brother, sister, and sister; Hypertension in her father.     Allergies  Allergen Reactions  . Codeine      Current Outpatient Medications:  .  atorvastatin (LIPITOR) 10  MG tablet, Take 1 tablet (10 mg total) by mouth daily., Disp: 90 tablet, Rfl: 3 .  omeprazole (PRILOSEC) 20 MG capsule, Take 20 mg by mouth daily. , Disp: , Rfl:    Patient Care Team: Mar Daring, PA-C as PCP - General (Family Medicine)      Objective:   Vitals: BP 120/76 (BP Location: Left Arm, Patient Position: Sitting, Cuff Size: Normal)   Pulse 88   Temp 98.4 F (36.9 C) (Oral)   Resp 16   Ht 5\' 1"  (1.549 m)   Wt 132 lb (59.9 kg)   SpO2 98%   BMI 24.94 kg/m    Vitals:   12/02/17 1459  BP: 120/76  Pulse: 88  Resp: 16  Temp: 98.4 F (36.9 C)  TempSrc: Oral  SpO2: 98%  Weight: 132 lb (59.9 kg)  Height: 5\' 1"  (1.549 m)      Physical Exam  Constitutional: She is oriented to person, place, and time. She appears well-developed and well-nourished. No distress.  HENT:  Head: Normocephalic and atraumatic.  Right Ear: Hearing, tympanic membrane, external ear and ear canal normal.  Left Ear: Hearing, tympanic membrane, external ear and ear canal normal.  Nose: Nose normal.  Mouth/Throat: Uvula is midline, oropharynx is clear and moist and mucous membranes are normal. No oropharyngeal exudate.  Eyes: Conjunctivae and EOM are normal. Pupils are equal, round, and reactive to light. Right eye exhibits no discharge. Left eye exhibits no discharge. No scleral icterus.  Neck: Normal range of motion. Neck supple. No JVD present. Carotid bruit is not present. No tracheal deviation present. No thyromegaly present.  Cardiovascular: Normal rate, regular rhythm, normal heart sounds and intact distal pulses. Exam reveals no gallop and no friction rub.  No murmur heard. Pulmonary/Chest: Effort normal and breath sounds normal. No respiratory distress. She has no wheezes. She has no rales. She exhibits no tenderness.  Abdominal: Soft. Bowel sounds are normal. She exhibits no distension and no mass. There is no tenderness. There is no rebound and no guarding.  Musculoskeletal: Normal range of motion. She exhibits no edema or tenderness.  Lymphadenopathy:    She has no cervical adenopathy.  Neurological: She is alert and oriented to person, place, and time.  Skin: Skin is warm and dry. No rash noted. She is not diaphoretic.  Psychiatric: She has a normal mood and affect. Her behavior is normal. Judgment and thought content normal.  Vitals reviewed.    Depression Screen PHQ 2/9 Scores 11/25/2017 11/24/2016  PHQ - 2 Score 0 0      Assessment & Plan:     Routine Health Maintenance and Physical Exam  Exercise Activities and Dietary recommendations Goals    . Exercise 3x per week (30 min per time)     Recommend to start back  exercising for 3 days a week for 30-45 minutes.        Immunization History  Administered Date(s) Administered  . Influenza Split 10/25/2011  . Influenza, High Dose Seasonal PF 11/24/2016, 11/25/2017  . Pneumococcal Conjugate-13 11/24/2016    Health Maintenance  Topic Date Due  . TETANUS/TDAP  02/16/1970  . DEXA SCAN  02/17/2016  . OPHTHALMOLOGY EXAM  11/09/2017  . PNA vac Low Risk Adult (2 of 2 - PPSV23) 11/24/2017  . HEMOGLOBIN A1C  01/28/2018  . FOOT EXAM  07/27/2018  . URINE MICROALBUMIN  07/28/2018  . MAMMOGRAM  03/30/2019  . COLONOSCOPY  11/01/2023  . INFLUENZA VACCINE  Completed  . Hepatitis C  Screening  Completed     Discussed health benefits of physical activity, and encouraged her to engage in regular exercise appropriate for her age and condition.    1. Annual physical exam Normal physical exam today. Will check labs as below and f/u pending lab results. If labs are stable and WNL she will not need to have these rechecked for one year at her next annual physical exam. She is to call the office in the meantime if she has any acute issue, questions or concerns. - CBC w/Diff/Platelet - COMPLETE METABOLIC PANEL WITH GFR - TSH  2. Breast cancer screening There is no family history of breast cancer. She does perform regular self breast exams. Mammogram was ordered as below. Information for [facility] was given to patient so she may schedule her mammogram at her convenience. - MM Digital Screening; Future  3. Gastroesophageal reflux disease without esophagitis Stable. Diagnosis pulled for medication refill. Continue current medical treatment plan. Will check labs as below and f/u pending results. - COMPLETE METABOLIC PANEL WITH GFR - omeprazole (PRILOSEC) 20 MG capsule; Take 1 capsule (20 mg total) by mouth daily.  Dispense: 90 capsule; Refill: 1  4. Type 2 diabetes mellitus without complication, without long-term current use of insulin (HCC) Diet  controlled. A1c stable at 6.5. I will see her back in 6 months for recheck. We will also get pneumococcal 23 at that time.  - COMPLETE METABOLIC PANEL WITH GFR - POCT HgB A1C  5. Hypercholesteremia Stable on atorvastatin 10mg . Will check labs as below and f/u pending results. - COMPLETE METABOLIC PANEL WITH GFR - Lipid Profile  6. Neutropenia, unspecified type ([place]) Will check labs as below and f/u pending results. - CBC w/Diff/Platelet -------------------------------------------------------------------    Mar Daring, PA-C  [facility]

## 2017-12-09 ENCOUNTER — Other Ambulatory Visit: Payer: Self-pay | Admitting: Physician Assistant

## 2017-12-09 DIAGNOSIS — E7800 Pure hypercholesterolemia, unspecified: Secondary | ICD-10-CM

## 2017-12-14 DIAGNOSIS — E119 Type 2 diabetes mellitus without complications: Secondary | ICD-10-CM | POA: Diagnosis not present

## 2017-12-14 DIAGNOSIS — K219 Gastro-esophageal reflux disease without esophagitis: Secondary | ICD-10-CM | POA: Diagnosis not present

## 2017-12-14 DIAGNOSIS — D709 Neutropenia, unspecified: Secondary | ICD-10-CM | POA: Diagnosis not present

## 2017-12-14 DIAGNOSIS — E7800 Pure hypercholesterolemia, unspecified: Secondary | ICD-10-CM | POA: Diagnosis not present

## 2017-12-16 LAB — LIPID PANEL
CHOLESTEROL: 194 mg/dL (ref <200)
HDL: 56 mg/dL (ref >50)
LDL CHOLESTEROL (CALC): 113 mg/dL — AB
Non-HDL Cholesterol (Calc): 138 mg/dL (calc) — ABNORMAL HIGH (ref <130)
TRIGLYCERIDES: 141 mg/dL (ref <150)
Total CHOL/HDL Ratio: 3.5 (calc) (ref <5.0)

## 2017-12-16 LAB — COMPLETE METABOLIC PANEL WITH GFR
AG Ratio: 1.6 (calc) (ref 1.0–2.5)
ALKALINE PHOSPHATASE (APISO): 57 U/L (ref 33–130)
ALT: 16 U/L (ref 6–29)
AST: 15 U/L (ref 10–35)
Albumin: 4.2 g/dL (ref 3.6–5.1)
BUN: 16 mg/dL (ref 7–25)
CO2: 25 mmol/L (ref 20–32)
CREATININE: 0.76 mg/dL (ref 0.50–0.99)
Calcium: 9.5 mg/dL (ref 8.6–10.4)
Chloride: 107 mmol/L (ref 98–110)
GFR, Est African American: 95 mL/min/{1.73_m2} (ref >=60)
GFR, Est Non African American: 82 mL/min/{1.73_m2} (ref >=60)
GLUCOSE: 126 mg/dL — AB (ref 65–99)
Globulin: 2.6 g/dL (calc) (ref 1.9–3.7)
Potassium: 4.1 mmol/L (ref 3.5–5.3)
Sodium: 140 mmol/L (ref 135–146)
Total Bilirubin: 0.5 mg/dL (ref 0.2–1.2)
Total Protein: 6.8 g/dL (ref 6.1–8.1)

## 2017-12-16 LAB — CBC WITH DIFFERENTIAL/PLATELET
Basophils Absolute: 50 cells/uL (ref 0–200)
Basophils Relative: 1.5 %
EOS ABS: 99 {cells}/uL (ref 15–500)
EOS PCT: 3 %
HCT: 38 % (ref 35.0–45.0)
HEMOGLOBIN: 12.7 g/dL (ref 11.7–15.5)
LYMPHS ABS: 1333 {cells}/uL (ref 850–3900)
MCH: 29.1 pg (ref 27.0–33.0)
MCHC: 33.4 g/dL (ref 32.0–36.0)
MCV: 87.2 fL (ref 80.0–100.0)
MPV: 10.8 fL (ref 7.5–12.5)
Monocytes Relative: 10.8 %
Neutro Abs: 1462 cells/uL — ABNORMAL LOW (ref 1500–7800)
Neutrophils Relative %: 44.3 %
Platelets: 232 10*3/uL (ref 140–400)
RBC: 4.36 10*6/uL (ref 3.80–5.10)
RDW: 12.7 % (ref 11.0–15.0)
TOTAL LYMPHOCYTE: 40.4 %
WBC: 3.3 10*3/uL — AB (ref 3.8–10.8)
WBCMIX: 356 {cells}/uL (ref 200–950)

## 2017-12-16 LAB — TEST AUTHORIZATION

## 2017-12-16 LAB — TSH: TSH: 0.93 m[IU]/L (ref 0.40–4.50)

## 2017-12-16 LAB — HEMOGLOBIN A1C W/OUT EAG: Hgb A1c MFr Bld: 6.8 % of total Hgb — ABNORMAL HIGH (ref <5.7)

## 2017-12-21 ENCOUNTER — Ambulatory Visit
Admission: RE | Admit: 2017-12-21 | Discharge: 2017-12-21 | Disposition: A | Discharge status: Home / Self Care | Payer: PPO | Source: Ambulatory Visit | Attending: Physician Assistant | Admitting: Physician Assistant

## 2017-12-21 ENCOUNTER — Telehealth: Payer: Self-pay

## 2017-12-21 DIAGNOSIS — Z13820 Encounter for screening for osteoporosis: Secondary | ICD-10-CM | POA: Insufficient documentation

## 2017-12-21 DIAGNOSIS — Z780 Asymptomatic menopausal state: Secondary | ICD-10-CM | POA: Diagnosis not present

## 2017-12-21 DIAGNOSIS — M810 Age-related osteoporosis without current pathological fracture: Secondary | ICD-10-CM | POA: Insufficient documentation

## 2017-12-21 NOTE — Telephone Encounter (Signed)
Patient advised as directed below. Per patient she is going to research about it and will call to schedule appointment.   Thanks,  -Oretta Berkland

## 2017-12-21 NOTE — Telephone Encounter (Signed)
-----   Message from Mar Daring, PA-C sent at 12/21/2017  9:58 AM EST ----- BMD shows osteoporosis. Make sure to be taking calcium 1200mg  daily and Vit D 800 IU daily and do weight bearing exercises such as daily walking. I would like to treat further if patient is interested. If so please schedule an appointment to discuss the different treatment options.

## 2017-12-21 NOTE — Telephone Encounter (Signed)
Patient was advised of labs results. She is going to call to schedule her 3 months follow-up.  Thanks,  -Nyasia Baxley

## 2017-12-21 NOTE — Telephone Encounter (Signed)
-----   Message from Mar Daring, [place] sent at 12/15/2017  8:24 AM EST ----- Can we see if an A1c can be added due to elevated fasting blood glucose. Thanks.

## 2018-01-06 ENCOUNTER — Ambulatory Visit (INDEPENDENT_AMBULATORY_CARE_PROVIDER_SITE_OTHER): Payer: PPO | Admitting: Physician Assistant

## 2018-01-06 ENCOUNTER — Encounter: Payer: Self-pay | Admitting: Physician Assistant

## 2018-01-06 VITALS — BP 122/80 | HR 112 | Temp 99.0°F | Resp 16 | Wt 130.0 lb

## 2018-01-06 DIAGNOSIS — J020 Streptococcal pharyngitis: Secondary | ICD-10-CM | POA: Diagnosis not present

## 2018-01-06 DIAGNOSIS — J029 Acute pharyngitis, unspecified: Secondary | ICD-10-CM

## 2018-01-06 LAB — POCT RAPID STREP A (OFFICE): Rapid Strep A Screen: POSITIVE — AB

## 2018-01-06 MED ORDER — AMOXICILLIN 875 MG PO TABS: 875.0000 mg | ORAL_TABLET | Freq: Two times a day (BID) | ORAL | 0 refills | Status: AC

## 2018-01-06 NOTE — Patient Instructions (Signed)

## 2018-01-06 NOTE — Progress Notes (Signed)
Patient: Holly Matthews Female    DOB: [birth year]   67 y.o.   MRN: [id number] Visit Date: 01/06/2018  Today's Provider: Trinna Post, PA-C   Chief Complaint  Patient presents with  . Sore Throat    Started Yesterday   Subjective:    Sore Throat   This is a new problem. The current episode started yesterday. The problem has been gradually worsening. Neither side of throat is experiencing more pain than the other. Associated symptoms include congestion, coughing, drooling, ear pain (Left ear pain last week) and headaches. Pertinent negatives include no abdominal pain, ear discharge, hoarse voice, shortness of breath, stridor, swollen glands, trouble swallowing or vomiting.       Allergies  Allergen Reactions  . Codeine      Current Outpatient Medications:  .  atorvastatin (LIPITOR) 10 MG tablet, TAKE 1 TABLET(10 MG) BY MOUTH DAILY, Disp: 90 tablet, Rfl: 1 .  omeprazole (PRILOSEC) 20 MG capsule, Take 1 capsule (20 mg total) by mouth daily., Disp: 90 capsule, Rfl: 1  Review of Systems  Constitutional: Positive for chills, fatigue and fever. Negative for activity change, appetite change, diaphoresis and unexpected weight change.  HENT: Positive for congestion, drooling, ear pain (Left ear pain last week), postnasal drip, rhinorrhea and sore throat. Negative for ear discharge, hoarse voice, sinus pressure, sinus pain, trouble swallowing and voice change.   Eyes: Negative.   Respiratory: Positive for cough. Negative for apnea, choking, chest tightness, shortness of breath, wheezing and stridor.   Gastrointestinal: Negative.  Negative for abdominal pain and vomiting.  Neurological: Positive for headaches. Negative for dizziness and light-headedness.    Social History   Tobacco Use  . Smoking status: Never Smoker  . Smokeless tobacco: Never Used  Substance Use Topics  . Alcohol use: No   Objective:   BP 122/80 (BP Location: Right Arm, Patient Position: Sitting, Cuff  Size: Normal)   Pulse (!) 112   Temp 99 F (37.2 C) (Oral)   Resp 16   Wt 130 lb (59 kg)   BMI 24.56 kg/m  Vitals:   01/06/18 0839  BP: 122/80  Pulse: (!) 112  Resp: 16  Temp: 99 F (37.2 C)  TempSrc: Oral  Weight: 130 lb (59 kg)     Physical Exam  Constitutional: She is oriented to person, place, and time. She appears well-developed and well-nourished.  HENT:  Right Ear: External ear normal.  Left Ear: External ear normal.  Mouth/Throat: Mucous membranes are normal. Posterior oropharyngeal edema and posterior oropharyngeal erythema present. No oropharyngeal exudate.  Neck: Neck supple.  Cardiovascular: Normal rate and regular rhythm.  Pulmonary/Chest: Effort normal and breath sounds normal.  Lymphadenopathy:    She has no cervical adenopathy.  Neurological: She is alert and oriented to person, place, and time.  Skin: Skin is warm and dry.  Psychiatric: She has a normal mood and affect. Her behavior is normal.        Assessment & Plan:     1. Strep pharyngitis  - amoxicillin (AMOXIL) 875 MG tablet; Take 1 tablet (875 mg total) by mouth 2 (two) times daily for 7 days.  Dispense: 14 tablet; Refill: 0  2. Sore throat  Positive rapid strep.  - POCT rapid strep A  Return if symptoms worsen or fail to improve.  The entirety of the information documented in the History of Present Illness, Review of Systems and Physical Exam were personally obtained by me. Portions of this  information were initially documented by Ashley Royalty, CMA and reviewed by me for thoroughness and accuracy.        Trinna Post, PA-C  [facility]

## 2018-01-19 ENCOUNTER — Telehealth: Payer: Self-pay | Admitting: Physician Assistant

## 2018-01-19 NOTE — Telephone Encounter (Signed)
Patient states that she was in for a office visit on 01/06/2018 with strep.  She states that she has finished the antibiotic and has a tickle feeling in her throat.  She is wanting to know if she can have a refill on amoxicillin (AMOXIL) 875 MG tablet.  She uses [facility] in [place] can leave a message if she does not answer.

## 2018-01-19 NOTE — Telephone Encounter (Signed)
Repeat antibiotic not indicated for symptoms she is describing. If she is worried about reinfection with strep, she needs to make OV to be examined and retested. Otherwise, can push fluids, drink warm tea and honey, and take Zyrtec or other allergy medication.

## 2018-03-08 ENCOUNTER — Encounter: Payer: Self-pay | Admitting: Physician Assistant

## 2018-03-08 ENCOUNTER — Ambulatory Visit (INDEPENDENT_AMBULATORY_CARE_PROVIDER_SITE_OTHER): Payer: PPO | Admitting: Physician Assistant

## 2018-03-08 VITALS — BP 100/70 | HR 84 | Temp 98.2°F | Resp 16 | Wt 130.0 lb

## 2018-03-08 DIAGNOSIS — J029 Acute pharyngitis, unspecified: Secondary | ICD-10-CM

## 2018-03-08 DIAGNOSIS — K115 Sialolithiasis: Secondary | ICD-10-CM

## 2018-03-08 LAB — POCT RAPID STREP A (OFFICE): RAPID STREP A SCREEN: NEGATIVE

## 2018-03-08 NOTE — Progress Notes (Signed)
       Patient: Holly Matthews Female    DOB: 06/10/1951   67 y.o.   MRN: [id number] Visit Date: 03/08/2018  Today's Provider: Mar Daring, PA-C   Chief Complaint  Patient presents with  . Sore Throat   Subjective:    HPI   Upper Respiratory Infection: Patient complains of sore throat. Symptoms include congestion and cough. Onset of symptoms was 3 days ago, gradually worsening since that time. She also c/o congestion, non productive cough and sore throat for the past 1 day .  She is drinking plenty of fluids. Evaluation to date: none. Treatment to date: throat lozenges.     Allergies  Allergen Reactions  . Codeine      Current Outpatient Medications:  .  atorvastatin (LIPITOR) 10 MG tablet, TAKE 1 TABLET(10 MG) BY MOUTH DAILY, Disp: 90 tablet, Rfl: 1 .  omeprazole (PRILOSEC) 20 MG capsule, Take 1 capsule (20 mg total) by mouth daily., Disp: 90 capsule, Rfl: 1  Review of Systems  Constitutional: Negative.   HENT: Positive for sore throat.   Respiratory: Positive for choking.     Social History   Tobacco Use  . Smoking status: Never Smoker  . Smokeless tobacco: Never Used  Substance Use Topics  . Alcohol use: No   Objective:   BP 100/70 (BP Location: Left Arm, Patient Position: Sitting, Cuff Size: Normal)   Pulse 84   Temp 98.2 F (36.8 C) (Oral)   Resp 16   Wt 130 lb (59 kg)   SpO2 96%   BMI 24.56 kg/m  Vitals:   03/08/18 0946  BP: 100/70  Pulse: 84  Resp: 16  Temp: 98.2 F (36.8 C)  TempSrc: Oral  SpO2: 96%  Weight: 130 lb (59 kg)     Physical Exam  Constitutional: She appears well-developed and well-nourished. No distress.  HENT:  Head: Normocephalic and atraumatic.  Right Ear: Hearing, tympanic membrane, external ear and ear canal normal.  Left Ear: Hearing, tympanic membrane, external ear and ear canal normal.  Nose: Nose normal.  Mouth/Throat: Uvula is midline and mucous membranes are normal. Posterior oropharyngeal edema present.  No oropharyngeal exudate or posterior oropharyngeal erythema.    Eyes: Conjunctivae are normal. Pupils are equal, round, and reactive to light. Right eye exhibits no discharge. Left eye exhibits no discharge. No scleral icterus.  Neck: Normal range of motion. Neck supple. No tracheal deviation present. No thyromegaly present.  Cardiovascular: Normal rate, regular rhythm and normal heart sounds. Exam reveals no gallop and no friction rub.  No murmur heard. Pulmonary/Chest: Effort normal and breath sounds normal. No stridor. No respiratory distress. She has no wheezes. She has no rales.  Lymphadenopathy:    She has no cervical adenopathy.  Skin: Skin is warm and dry. She is not diaphoretic.  Vitals reviewed.      Assessment & Plan:     1. Sore throat Strep negative. No fevers. No other symptoms. Suspect salivary stone causing pain and inflammation since no other signs of infection noted. Advised patient to try sour candies, chloraseptic spray, salt water gargles, push hydration. She is to call if symptoms worsen.  - POCT rapid strep A  2. Salivary stone See above medical treatment plan.       Mar Daring, PA-C  [facility]

## 2018-03-08 NOTE — Patient Instructions (Signed)
Salivary Stone A salivary stone is a mineral deposit that builds up in the ducts that drain your salivary glands. Most salivary gland stones are made of calcium. When a stone forms, saliva can back up into the gland and cause painful swelling. Your salivary glands are the glands that produce spit (saliva). You have six major salivary glands. Each gland has a duct that carries saliva into your mouth. Saliva keeps your mouth moist and breaks down the food that you eat. It also helps to prevent tooth decay. Two salivary glands are located just in front of your ears (parotid). The ducts for these glands open up inside your cheeks, near your back teeth. You also have two glands under your tongue (sublingual) and two glands under your jaw (submandibular). The ducts for these glands open under your tongue. A stone can form in any salivary gland. The most common place for a salivary stone to develop is in a submandibular salivary gland. What are the causes? Any condition that reduces the flow of saliva may lead to stone formation. It is not known why some people form stones and others do not. What increases the risk? You may be more likely to develop a salivary stone if you:  Are female.  Do not drink enough water.  Smoke.  Have high blood pressure.  Have gout.  Have diabetes.  What are the signs or symptoms? The main sign of a salivary gland stone is sudden swelling of a salivary gland when eating. This usually happens under the jaw on one side. Other signs and symptoms include:  Swelling of the cheek or under the tongue when eating.  Pain in the swollen area.  Trouble chewing or swallowing.  Swelling that goes down after eating.  How is this diagnosed? Your health care provider may diagnose a salivary gland stone based on your signs and symptoms. The health care provider will also do a physical exam. In many cases, a stone can be felt in a duct inside your mouth. You may need to see an ear,  nose, and throat specialist (ENT or otolaryngologist) for diagnosis and treatment. You may also need to have diagnostic tests. These may include imaging studies to check for a stone, such as:  X-rays.  Ultrasound.  CT scan.  MRI.  How is this treated? Home care may be enough to treat a small stone that is not causing symptoms. Treatment of a stone that is large enough to cause symptoms may include:  Probing and widening the duct to allow the stone to pass.  Inserting a thin, flexible scope (endoscope) into the duct to locate and remove the stone.  Breaking up the stone with sound waves.  Removing the entire salivary gland.  Follow these instructions at home:  Drink enough fluid to keep your urine clear or pale yellow.  Follow these instructions every few hours: ? Suck on a lemon candy to stimulate the flow of saliva. ? Put a hot compress over the gland. ? Gently massage the gland.  Do not use any tobacco products, including cigarettes, chewing tobacco, or electronic cigarettes. If you need help quitting, ask your health care provider. Contact a health care provider if:  You have pain and swelling in your face, jaw, or mouth after eating.  You have persistent swelling in any of these places: ? In front of your ear. ? Under your jaw. ? Inside your mouth. Get help right away if:  You have pain and swelling in your face,   jaw, or mouth that are getting worse.  Your pain and swelling make it hard to swallow or breathe. This information is not intended to replace advice given to you by your health care provider. Make sure you discuss any questions you have with your health care provider. Document Released: 01/13/2005 Document Revised: 05/13/2016 Document Reviewed: 05/08/2014 Elsevier Interactive Patient Education  2018 Elsevier Inc.  

## 2018-05-16 ENCOUNTER — Encounter: Payer: Self-pay | Admitting: Physician Assistant

## 2018-05-16 ENCOUNTER — Ambulatory Visit (INDEPENDENT_AMBULATORY_CARE_PROVIDER_SITE_OTHER): Payer: PPO | Admitting: Physician Assistant

## 2018-05-16 VITALS — BP 120/80 | HR 75 | Temp 98.3°F | Resp 16 | Wt 128.8 lb

## 2018-05-16 DIAGNOSIS — K449 Diaphragmatic hernia without obstruction or gangrene: Secondary | ICD-10-CM

## 2018-05-16 DIAGNOSIS — E119 Type 2 diabetes mellitus without complications: Secondary | ICD-10-CM | POA: Diagnosis not present

## 2018-05-16 DIAGNOSIS — Z23 Encounter for immunization: Secondary | ICD-10-CM | POA: Diagnosis not present

## 2018-05-16 DIAGNOSIS — M810 Age-related osteoporosis without current pathological fracture: Secondary | ICD-10-CM

## 2018-05-16 DIAGNOSIS — R079 Chest pain, unspecified: Secondary | ICD-10-CM

## 2018-05-16 LAB — POCT GLYCOSYLATED HEMOGLOBIN (HGB A1C)
Est. average glucose Bld gHb Est-mCnc: 163
HEMOGLOBIN A1C: 7.3 % — AB (ref 4.0–5.6)

## 2018-05-16 MED ORDER — PANTOPRAZOLE SODIUM 40 MG PO TBEC: 40.0000 mg | DELAYED_RELEASE_TABLET | Freq: Every day | ORAL | 1 refills | Status: DC

## 2018-05-16 NOTE — Progress Notes (Signed)
Patient: Holly Matthews Female    DOB: March 06, 1951   67 y.o.   MRN: [id number] Visit Date: 05/16/2018  Today's Provider: Mar Daring, PA-C   Chief Complaint  Patient presents with  . Chest Pain   Subjective:    HPI Patient C/O chest pain, burping, skipping beats and heart burn on and off x's 4 weeks. Patient reports symptoms happen at random times of the day. Patient denies any fatigue , headache, nausea, visual changes or shortness of breath. Patient reports that symptoms are present when she is sitting, not with activity.   She is in need of having her A1c rechecked today as well. Was to have rechecked in 3 months, but did not. A1c has been increasing. She tries to control with dietary and lifestyle modifications.  She would also like to discuss new diagnosis of osteoporosis. States she drinks plenty of milk. Does not take Vit D supplement. Does walk daily. Does not do formal exercise like she used to.   Needs pneumococcal 23 vaccine.   Allergies  Allergen Reactions  . Codeine      Current Outpatient Medications:  .  atorvastatin (LIPITOR) 10 MG tablet, TAKE 1 TABLET(10 MG) BY MOUTH DAILY, Disp: 90 tablet, Rfl: 1 .  omeprazole (PRILOSEC) 20 MG capsule, Take 1 capsule (20 mg total) by mouth daily., Disp: 90 capsule, Rfl: 1  Review of Systems  Constitutional: Negative.   HENT: Negative.   Respiratory: Positive for chest tightness. Negative for cough and shortness of breath.   Cardiovascular: Positive for chest pain and palpitations. Negative for leg swelling.  Gastrointestinal: Positive for abdominal distention. Negative for abdominal pain, constipation, diarrhea, nausea and vomiting.  Musculoskeletal: Positive for neck pain.  Neurological: Negative for dizziness, light-headedness and headaches.    Social History   Tobacco Use  . Smoking status: Never Smoker  . Smokeless tobacco: Never Used  Substance Use Topics  . Alcohol use: No   Objective:   BP  120/80 (BP Location: Left Arm, Patient Position: Sitting, Cuff Size: Normal)   Pulse 75   Temp 98.3 F (36.8 C) (Oral)   Resp 16   Wt 128 lb 12.8 oz (58.4 kg)   SpO2 97%   BMI 24.34 kg/m  Vitals:   05/16/18 0852  BP: 120/80  Pulse: 75  Resp: 16  Temp: 98.3 F (36.8 C)  TempSrc: Oral  SpO2: 97%  Weight: 128 lb 12.8 oz (58.4 kg)     Physical Exam  Constitutional: She appears well-developed and well-nourished. No distress.  Neck: Normal range of motion. Neck supple. No JVD present. No tracheal deviation present. No thyromegaly present.  Cardiovascular: Normal rate, regular rhythm and normal heart sounds. Exam reveals no gallop and no friction rub.  No murmur heard. Pulmonary/Chest: Effort normal and breath sounds normal. No respiratory distress. She has no wheezes. She has no rales.  Lymphadenopathy:    She has no cervical adenopathy.  Skin: She is not diaphoretic.  Vitals reviewed.      Assessment & Plan:     1. Chest pain, unspecified type EKG reviewed today and shows NSR with RBBB rate of 68. Unchanged since 2017 EKG. Suspect chest pain to be secondary to progressive hiatal hernia. Will stop omeprazole and start pantoprazole as below. If still no improvement may consider trying dexilant vs surgical referral for repair. She is in agreement. Can recheck in 3 months. - EKG 12-Lead  2. Hiatal hernia See above medical treatment plan. -  pantoprazole (PROTONIX) 40 MG tablet; Take 1 tablet (40 mg total) by mouth daily.  Dispense: 90 tablet; Refill: 1  3. Type 2 diabetes mellitus without complication, without long-term current use of insulin (HCC) A1c up to 7.3 from 6.8. Patient declines restarting medications still stating "I can get it down." She is going to increase exercise back and work on dietary changes. I will see her back in 3 months to recheck.  - POCT HgB A1C  4. Age-related osteoporosis without current pathological fracture Discussed results of BMD. Patient  desires to try calcium + Vit D and weight bearing exercises. Most likely not a candidate for bisphosphonate due to symptoms already present from hiatal hernia. Will monitor and recheck BMD in 2 years.   5. Need for pneumococcal vaccination Pneumococcal 23 Vaccine given to patient without complications. Patient sat for 15 minutes after administration and was tolerated well without adverse effects. - Pneumococcal polysaccharide vaccine 23-valent greater than or equal to 2yo subcutaneous/IM       Mar Daring, PA-C  [place]

## 2018-05-16 NOTE — Patient Instructions (Signed)
Hiatal Hernia A hiatal hernia occurs when part of the stomach slides above the muscle that separates the abdomen from the chest (diaphragm). A person can be born with a hiatal hernia (congenital), or it may develop over time. In almost all cases of hiatal hernia, only the top part of the stomach pushes through the diaphragm. Many people have a hiatal hernia with no symptoms. The larger the hernia, the more likely it is that you will have symptoms. In some cases, a hiatal hernia allows stomach acid to flow back into the tube that carries food from your mouth to your stomach (esophagus). This may cause heartburn symptoms. Severe heartburn symptoms may mean that you have developed a condition called gastroesophageal reflux disease (GERD). What are the causes? This condition is caused by a weakness in the opening (hiatus) where the esophagus passes through the diaphragm to attach to the upper part of the stomach. A person may be born with a weakness in the hiatus, or a weakness can develop over time. What increases the risk? This condition is more likely to develop in:  Older people. Age is a major risk factor for a hiatal hernia, especially if you are over the age of 57.  Pregnant women.  People who are overweight.  People who have frequent constipation.  What are the signs or symptoms? Symptoms of this condition usually develop in the form of GERD symptoms. Symptoms include:  Heartburn.  Belching.  Indigestion.  Trouble swallowing.  Coughing or wheezing.  Sore throat.  Hoarseness.  Chest pain.  Nausea and vomiting.  How is this diagnosed? This condition may be diagnosed during testing for GERD. Tests that may be done include:  X-rays of your stomach or chest.  An upper gastrointestinal (GI) series. This is an X-ray exam of your GI tract that is taken after you swallow a chalky liquid that shows up clearly on the X-ray.  Endoscopy. This is a procedure to look into your  stomach using a thin, flexible tube that has a tiny camera and light on the end of it.  How is this treated? This condition may be treated by:  Dietary and lifestyle changes to help reduce GERD symptoms.  Medicines. These may include: ? Over-the-counter antacids. ? Medicines that make your stomach empty more quickly. ? Medicines that block the production of stomach acid (H2 blockers). ? Stronger medicines to reduce stomach acid (proton pump inhibitors).  Surgery to repair the hernia, if other treatments are not helping.  If you have no symptoms, you may not need treatment. Follow these instructions at home: Lifestyle and activity  Do not use any products that contain nicotine or tobacco, such as cigarettes and e-cigarettes. If you need help quitting, ask your health care provider.  Try to achieve and maintain a healthy body weight.  Avoid putting pressure on your abdomen. Anything that puts pressure on your abdomen increases the amount of acid that may be pushed up into your esophagus. ? Avoid bending over, especially after eating. ? Raise the head of your bed by putting blocks under the legs. This keeps your head and esophagus higher than your stomach. ? Do not wear tight clothing around your chest or stomach. ? Try not to strain when having a bowel movement, when urinating, or when lifting heavy objects. Eating and drinking  Avoid foods that can worsen GERD symptoms. These may include: ? Fatty foods, like fried foods. ? Citrus fruits, like oranges or lemon. ? Other foods and drinks that  contain acid, like orange juice or tomatoes. ? Spicy food. ? Chocolate.  Eat frequent small meals instead of three large meals a day. This helps prevent your stomach from getting too full. ? Eat slowly. ? Do not lie down right after eating. ? Do not eat 1-2 hours before bed.  Do not drink beverages with caffeine. These include cola, coffee, cocoa, and tea.  Do not drink alcohol. General  instructions  Take over-the-counter and prescription medicines only as told by your health care provider.  Keep all follow-up visits as told by your health care provider. This is important. Contact a health care provider if:  Your symptoms are not controlled with medicines or lifestyle changes.  You are having trouble swallowing.  You have coughing or wheezing that will not go away. Get help right away if:  Your pain is getting worse.  Your pain spreads to your arms, neck, jaw, teeth, or back.  You have shortness of breath.  You sweat for no reason.  You feel sick to your stomach (nauseous) or you vomit.  You vomit blood.  You have bright red blood in your stools.  You have black, tarry stools. This information is not intended to replace advice given to you by your health care provider. Make sure you discuss any questions you have with your health care provider. Document Released: 02/26/2004 Document Revised: 11/29/2016 Document Reviewed: 11/29/2016 Elsevier Interactive Patient Education  2018 [employer]. Pantoprazole tablets What is this medicine? PANTOPRAZOLE (pan TOE pra zole) prevents the production of acid in the stomach. It is used to treat gastroesophageal reflux disease (GERD), inflammation of the esophagus, and Zollinger-Ellison syndrome. This medicine may be used for other purposes; ask your health care provider or pharmacist if you have questions. COMMON BRAND NAME(S): Protonix What should I tell my health care provider before I take this medicine? They need to know if you have any of these conditions: -liver disease -low levels of magnesium in the blood -lupus -an unusual or allergic reaction to omeprazole, lansoprazole, pantoprazole, rabeprazole, other medicines, foods, dyes, or preservatives -pregnant or trying to get pregnant -breast-feeding How should I use this medicine? Take this medicine by mouth. Swallow the tablets whole with a drink of water.  Follow the directions on the prescription label. Do not crush, break, or chew. Take your medicine at regular intervals. Do not take your medicine more often than directed. Talk to your pediatrician regarding the use of this medicine in children. While this drug may be prescribed for children as young as 5 years for selected conditions, precautions do apply. Overdosage: If you think you have taken too much of this medicine contact a poison control center or emergency room at once. NOTE: This medicine is only for you. Do not share this medicine with others. What if I miss a dose? If you miss a dose, take it as soon as you can. If it is almost time for your next dose, take only that dose. Do not take double or extra doses. What may interact with this medicine? Do not take this medicine with any of the following medications: -atazanavir -nelfinavir This medicine may also interact with the following medications: -ampicillin -delavirdine -erlotinib -iron salts -medicines for fungal infections like ketoconazole, itraconazole and voriconazole -methotrexate -mycophenolate mofetil -warfarin This list may not describe all possible interactions. Give your health care provider a list of all the medicines, herbs, non-prescription drugs, or dietary supplements you use. Also tell them if you smoke, drink alcohol, or use  illegal drugs. Some items may interact with your medicine. What should I watch for while using this medicine? It can take several days before your stomach pain gets better. Check with your doctor or health care professional if your condition does not start to get better, or if it gets worse. You may need blood work done while you are taking this medicine. What side effects may I notice from receiving this medicine? Side effects that you should report to your doctor or health care professional as soon as possible: -allergic reactions like skin rash, itching or hives, swelling of the face,  lips, or tongue -bone, muscle or joint pain -breathing problems -chest pain or chest tightness -dark yellow or brown urine -dizziness -fast, irregular heartbeat -feeling faint or lightheaded -fever or sore throat -muscle spasm -palpitations -rash on cheeks or arms that gets worse in the sun -redness, blistering, peeling or loosening of the skin, including inside the mouth -seizures -tremors -unusual bleeding or bruising -unusually weak or tired -yellowing of the eyes or skin Side effects that usually do not require medical attention (report to your doctor or health care professional if they continue or are bothersome): -constipation -diarrhea -dry mouth -headache -nausea This list may not describe all possible side effects. Call your doctor for medical advice about side effects. You may report side effects to FDA at 1-800-FDA-1088. Where should I keep my medicine? Keep out of the reach of children. Store at room temperature between 15 and 30 degrees C (59 and 86 degrees F). Protect from light and moisture. Throw away any unused medicine after the expiration date. NOTE: This sheet is a summary. It may not cover all possible information. If you have questions about this medicine, talk to your doctor, pharmacist, or health care provider.  2018 Elsevier/Gold Standard (2016-01-08 12:20:19)

## 2018-06-02 ENCOUNTER — Ambulatory Visit: Payer: PPO | Admitting: Physician Assistant

## 2018-06-05 ENCOUNTER — Ambulatory Visit
Admission: RE | Admit: 2018-06-05 | Discharge: 2018-06-05 | Disposition: A | Discharge status: Home / Self Care | Payer: PPO | Source: Ambulatory Visit | Attending: Physician Assistant | Admitting: Physician Assistant

## 2018-06-05 ENCOUNTER — Encounter: Payer: Self-pay | Admitting: Physician Assistant

## 2018-06-05 ENCOUNTER — Ambulatory Visit (INDEPENDENT_AMBULATORY_CARE_PROVIDER_SITE_OTHER): Payer: PPO | Admitting: Physician Assistant

## 2018-06-05 VITALS — BP 100/60 | HR 85 | Temp 98.6°F | Resp 16 | Wt 129.2 lb

## 2018-06-05 DIAGNOSIS — R0781 Pleurodynia: Secondary | ICD-10-CM | POA: Diagnosis not present

## 2018-06-05 DIAGNOSIS — S20211A Contusion of right front wall of thorax, initial encounter: Secondary | ICD-10-CM | POA: Diagnosis not present

## 2018-06-05 NOTE — Patient Instructions (Signed)
Rib Contusion A rib contusion is a deep bruise on your rib area. Contusions are the result of a blunt trauma that causes bleeding and injury to the tissues under the skin. A rib contusion may involve bruising of the ribs and of the skin and muscles in the area. The skin overlying the contusion may turn blue, purple, or yellow. Minor injuries will give you a painless contusion, but more severe contusions may stay painful and swollen for a few weeks. What are the causes? A contusion is usually caused by a blow, trauma, or direct force to an area of the body. This often occurs while playing contact sports. What are the signs or symptoms?  Swelling and redness of the injured area.  Discoloration of the injured area.  Tenderness and soreness of the injured area.  Pain with or without movement. How is this diagnosed? The diagnosis can be made by taking a medical history and performing a physical exam. An X-ray, CT scan, or MRI may be needed to determine if there were any associated injuries, such as broken bones (fractures) or internal injuries. How is this treated? Often, the best treatment for a rib contusion is rest. Icing or applying cold compresses to the injured area may help reduce swelling and inflammation. Deep breathing exercises may be recommended to reduce the risk of partial lung collapse and pneumonia. Over-the-counter or prescription medicines may also be recommended for pain control. Follow these instructions at home:  Apply ice to the injured area: ? Put ice in a plastic bag. ? Place a towel between your skin and the bag. ? Leave the ice on for 20 minutes, 2-3 times per day.  Take medicines only as directed by your health care provider.  Rest the injured area. Avoid strenuous activity and any activities or movements that cause pain. Be careful during activities and avoid bumping the injured area.  Perform deep-breathing exercises as directed by your health care provider.  Do  not lift anything that is heavier than 5 lb (2.3 kg) until your health care provider approves.  Do not use any tobacco products, including cigarettes, chewing tobacco, or electronic cigarettes. If you need help quitting, ask your health care provider. Contact a health care provider if:  You have increased bruising or swelling.  You have pain that is not controlled with treatment.  You have a fever. Get help right away if:  You have difficulty breathing or shortness of breath.  You develop a continual cough, or you cough up thick or bloody sputum.  You feel sick to your stomach (nauseous), you throw up (vomit), or you have abdominal pain. This information is not intended to replace advice given to you by your health care provider. Make sure you discuss any questions you have with your health care provider. Document Released: 08/31/2001 Document Revised: 05/13/2016 Document Reviewed: 09/17/2014 Elsevier Interactive Patient Education  2018 Elsevier Inc.  

## 2018-06-05 NOTE — Progress Notes (Signed)
       Patient: Holly Matthews Female    DOB: 1951/03/05   67 y.o.   MRN: [id number] Visit Date: 06/05/2018  Today's Provider: Mar Daring, PA-C   Chief Complaint  Patient presents with  . Chest Pain   Subjective:    HPI Patient coming in today with c/o pain under her right rib. She reports that she leaned forward over her tub to clean it and she felt a sharp pain on her rib. This happened last Sunday, 05/28/18. Denies SOB, chest pain. She is requesting a chest xray to make sure her lung is ok and see if she broke a rib. Patient BMD showed Osteoporosis on 12/21/2017. She does have a rib brace as well from a previous rib contusion but has not started using it yet. She was improving until Saturday, 06/03/18, and pain became more sharp. Denies any secondary injury or exacerbation.      Allergies  Allergen Reactions  . Codeine      Current Outpatient Medications:  .  atorvastatin (LIPITOR) 10 MG tablet, TAKE 1 TABLET(10 MG) BY MOUTH DAILY, Disp: 90 tablet, Rfl: 1 .  pantoprazole (PROTONIX) 40 MG tablet, Take 1 tablet (40 mg total) by mouth daily., Disp: 90 tablet, Rfl: 1  Review of Systems  Constitutional: Negative.   Respiratory: Negative for apnea, cough, chest tightness, shortness of breath and wheezing.   Cardiovascular: Negative for chest pain, palpitations and leg swelling.       Tender along right ribs  Gastrointestinal: Negative.   Neurological: Negative.     Social History   Tobacco Use  . Smoking status: Never Smoker  . Smokeless tobacco: Never Used  Substance Use Topics  . Alcohol use: No   Objective:   BP 100/60 (BP Location: Left Arm, Patient Position: Sitting, Cuff Size: Normal)   Pulse 85   Temp 98.6 F (37 C) (Oral)   Resp 16   Wt 129 lb 3.2 oz (58.6 kg)   SpO2 98%   BMI 24.41 kg/m    Physical Exam  Constitutional: She appears well-developed and well-nourished. No distress.  Neck: Normal range of motion. Neck supple. No tracheal deviation  present.  Cardiovascular: Normal rate, regular rhythm and normal heart sounds. Exam reveals no gallop and no friction rub.  No murmur heard. Pulmonary/Chest: Effort normal and breath sounds normal. No respiratory distress. She has no wheezes. She has no rales. She exhibits tenderness. She exhibits no crepitus, no deformity and no swelling.    Skin: She is not diaphoretic.  Vitals reviewed.      Assessment & Plan:     1. Rib contusion, right, initial encounter Will get imaging as below to r/o fracture or displacement. May begin using rib brace. Advised to apply snugly but still be able to take a deep breath with it on. May use IBU and tylenol alternating. Heat to the area of and on 20 minutes at a time. Also advised of deep breathing exercises to perform a few times through the day to prevent pneumonia.  - DG Ribs Unilateral Right; Future       Mar Daring, PA-C  [facility]

## 2018-06-06 ENCOUNTER — Telehealth: Payer: Self-pay

## 2018-06-06 NOTE — Telephone Encounter (Signed)
Viewed by Jamelle Haring Mcminn on 06/05/2018 2:08 PM

## 2018-06-06 NOTE — Telephone Encounter (Signed)
-----   Message from Mar Daring, [place] sent at 06/05/2018  1:42 PM EDT ----- Rib xray is normal. Lung looks normal. No fracture noted.

## 2018-06-14 ENCOUNTER — Other Ambulatory Visit: Payer: Self-pay | Admitting: Physician Assistant

## 2018-06-14 DIAGNOSIS — E7800 Pure hypercholesterolemia, unspecified: Secondary | ICD-10-CM

## 2018-08-16 ENCOUNTER — Ambulatory Visit (INDEPENDENT_AMBULATORY_CARE_PROVIDER_SITE_OTHER): Payer: PPO | Admitting: Physician Assistant

## 2018-08-16 ENCOUNTER — Encounter: Payer: Self-pay | Admitting: Physician Assistant

## 2018-08-16 VITALS — BP 100/60 | HR 80 | Temp 98.8°F | Resp 16 | Ht 61.0 in | Wt 129.4 lb

## 2018-08-16 DIAGNOSIS — E119 Type 2 diabetes mellitus without complications: Secondary | ICD-10-CM | POA: Diagnosis not present

## 2018-08-16 DIAGNOSIS — K449 Diaphragmatic hernia without obstruction or gangrene: Secondary | ICD-10-CM

## 2018-08-16 DIAGNOSIS — K219 Gastro-esophageal reflux disease without esophagitis: Secondary | ICD-10-CM

## 2018-08-16 LAB — POCT GLYCOSYLATED HEMOGLOBIN (HGB A1C)
Est. average glucose Bld gHb Est-mCnc: 160
HEMOGLOBIN A1C: 7.2 % — AB (ref 4.0–5.6)

## 2018-08-16 NOTE — Progress Notes (Signed)
Patient: Holly Matthews Female    DOB: 1951/09/07   67 y.o.   MRN: [id number] Visit Date: 08/16/2018  Today's Provider: Mar Daring, PA-C   Chief Complaint  Patient presents with  . Follow-up    Hiatal Hernia and T2DM   Subjective:    HPI  Hiatal Hernia, Follow up:  The patient was last seen for Hiatal Hernia 3 months ago. Changes made since that visit include stop Omeprazole and start Pantoprazole. If still no improvement may consider trying Dexilant vs surgical referral and repair.  She reports excellent compliance with treatment. She is not having side effects. Patient reports that she is not seeing any improvement with the Pantoprazole. She reports having the same symptoms. Reports heart burn on and off. Patient wants a GI referral. ------------------------------------------------------------------------  Diabetes Mellitus Type II, Follow-up:   Lab Results  Component Value Date   HGBA1C 7.2 (A) 08/16/2018   HGBA1C 7.3 (A) 05/16/2018   HGBA1C 6.8 (H) 12/14/2017    Last seen for diabetes 3 months ago.  Management since then includes patient declines restarting medication. Current symptoms include none and have been stable.   Current Insulin Regimen: none Most Recent Eye Exam: Per patient scheduled 08/2018 Weight trend: stable Current diet: in general, an "unhealthy" diet Current exercise: none She reports she has had a very stressful summer.  Pertinent Labs:    Component Value Date/Time   CHOL 194 12/14/2017 0834   CHOL 162 11/25/2016 0818   TRIG 141 12/14/2017 0834   HDL 56 12/14/2017 0834   HDL 51 11/25/2016 0818   LDLCALC 113 (H) 12/14/2017 0834   CREATININE 0.76 12/14/2017 0834    Wt Readings from Last 3 Encounters:  08/16/18 129 lb 6.4 oz (58.7 kg)  06/05/18 129 lb 3.2 oz (58.6 kg)  05/16/18 128 lb 12.8 oz (58.4 kg)   ------------------------------------------------------------------------      Allergies  Allergen Reactions    . Codeine      Current Outpatient Medications:  .  atorvastatin (LIPITOR) 10 MG tablet, TAKE 1 TABLET(10 MG) BY MOUTH DAILY, Disp: 90 tablet, Rfl: 1 .  pantoprazole (PROTONIX) 40 MG tablet, Take 1 tablet (40 mg total) by mouth daily., Disp: 90 tablet, Rfl: 1  Review of Systems  Constitutional: Negative.   Respiratory: Negative.   Cardiovascular: Negative.   Gastrointestinal: Positive for abdominal distention and abdominal pain. Negative for nausea and vomiting.    Social History   Tobacco Use  . Smoking status: Never Smoker  . Smokeless tobacco: Never Used  Substance Use Topics  . Alcohol use: No   Objective:   BP 100/60 (BP Location: Left Arm, Patient Position: Sitting, Cuff Size: Normal)   Pulse 80   Temp 98.8 F (37.1 C) (Oral)   Resp 16   Ht 5\' 1"  (1.549 m)   Wt 129 lb 6.4 oz (58.7 kg)   SpO2 97%   BMI 24.45 kg/m  Vitals:   08/16/18 1442  BP: 100/60  Pulse: 80  Resp: 16  Temp: 98.8 F (37.1 C)  TempSrc: Oral  SpO2: 97%  Weight: 129 lb 6.4 oz (58.7 kg)  Height: 5\' 1"  (1.549 m)     Physical Exam  Constitutional: She appears well-developed and well-nourished. No distress.  Neck: Normal range of motion. Neck supple.  Cardiovascular: Normal rate, regular rhythm and normal heart sounds. Exam reveals no gallop and no friction rub.  No murmur heard. Pulmonary/Chest: Effort normal and breath sounds normal. No  respiratory distress. She has no wheezes. She has no rales.  Skin: She is not diaphoretic.  Vitals reviewed.      Assessment & Plan:     1. Type 2 diabetes mellitus without complication, without long-term current use of insulin (HCC) A1c stable at 7.2. Continue lifestyle modifications. I will see her back in 3-4 months for CPE.  - POCT glycosylated hemoglobin (Hb A1C)  2. Hiatal hernia Worsening symptoms. Will refer to GI for further evaluation. Patient due for colonoscopy in Nov this year anyway so may be considered for both EGD and colonoscopy.   - Ambulatory referral to Gastroenterology  3. Gastroesophageal reflux disease without esophagitis See above medical treatment plan. - Ambulatory referral to Gastroenterology       Mar Daring, PA-C  [facility]

## 2018-09-05 DIAGNOSIS — Z86010 Personal history of colonic polyps: Secondary | ICD-10-CM | POA: Diagnosis not present

## 2018-09-05 DIAGNOSIS — R1013 Epigastric pain: Secondary | ICD-10-CM | POA: Insufficient documentation

## 2018-09-06 DIAGNOSIS — E119 Type 2 diabetes mellitus without complications: Secondary | ICD-10-CM | POA: Diagnosis not present

## 2018-09-06 LAB — HM DIABETES EYE EXAM

## 2018-10-11 DIAGNOSIS — E7800 Pure hypercholesterolemia, unspecified: Secondary | ICD-10-CM | POA: Diagnosis not present

## 2018-10-11 DIAGNOSIS — R55 Syncope and collapse: Secondary | ICD-10-CM | POA: Diagnosis not present

## 2018-10-11 DIAGNOSIS — E119 Type 2 diabetes mellitus without complications: Secondary | ICD-10-CM | POA: Diagnosis not present

## 2018-10-11 DIAGNOSIS — R002 Palpitations: Secondary | ICD-10-CM | POA: Insufficient documentation

## 2018-10-11 DIAGNOSIS — R079 Chest pain, unspecified: Secondary | ICD-10-CM | POA: Diagnosis not present

## 2018-10-23 DIAGNOSIS — R002 Palpitations: Secondary | ICD-10-CM | POA: Diagnosis not present

## 2018-10-31 ENCOUNTER — Telehealth: Payer: Self-pay

## 2018-10-31 NOTE — Telephone Encounter (Signed)
Called pt to schedule her AWV prior to her CPE on 12/06/18 and pt stated she wanted to skip the wellness this year.  FYI to PCP! -MM

## 2018-11-02 DIAGNOSIS — R002 Palpitations: Secondary | ICD-10-CM | POA: Diagnosis not present

## 2018-11-02 DIAGNOSIS — R079 Chest pain, unspecified: Secondary | ICD-10-CM | POA: Diagnosis not present

## 2018-11-03 ENCOUNTER — Encounter: Payer: Self-pay | Admitting: *Deleted

## 2018-11-06 ENCOUNTER — Ambulatory Visit: Payer: PPO | Admitting: Anesthesiology

## 2018-11-06 ENCOUNTER — Encounter: Payer: Self-pay | Admitting: *Deleted

## 2018-11-06 ENCOUNTER — Encounter
Admission: RE | Disposition: A | Discharge status: Home / Self Care | Payer: Self-pay | Source: Ambulatory Visit | Attending: Gastroenterology

## 2018-11-06 ENCOUNTER — Ambulatory Visit
Admission: RE | Admit: 2018-11-06 | Discharge: 2018-11-06 | Disposition: A | Discharge status: Home / Self Care | Payer: PPO | Source: Ambulatory Visit | Attending: Gastroenterology | Admitting: Gastroenterology

## 2018-11-06 DIAGNOSIS — E7800 Pure hypercholesterolemia, unspecified: Secondary | ICD-10-CM | POA: Insufficient documentation

## 2018-11-06 DIAGNOSIS — K644 Residual hemorrhoidal skin tags: Secondary | ICD-10-CM | POA: Diagnosis not present

## 2018-11-06 DIAGNOSIS — K640 First degree hemorrhoids: Secondary | ICD-10-CM | POA: Insufficient documentation

## 2018-11-06 DIAGNOSIS — Z86010 Personal history of colonic polyps: Secondary | ICD-10-CM | POA: Diagnosis not present

## 2018-11-06 DIAGNOSIS — Z885 Allergy status to narcotic agent status: Secondary | ICD-10-CM | POA: Insufficient documentation

## 2018-11-06 DIAGNOSIS — K209 Esophagitis, unspecified: Secondary | ICD-10-CM | POA: Diagnosis not present

## 2018-11-06 DIAGNOSIS — K2210 Ulcer of esophagus without bleeding: Secondary | ICD-10-CM | POA: Insufficient documentation

## 2018-11-06 DIAGNOSIS — D125 Benign neoplasm of sigmoid colon: Secondary | ICD-10-CM | POA: Diagnosis not present

## 2018-11-06 DIAGNOSIS — K449 Diaphragmatic hernia without obstruction or gangrene: Secondary | ICD-10-CM | POA: Insufficient documentation

## 2018-11-06 DIAGNOSIS — E119 Type 2 diabetes mellitus without complications: Secondary | ICD-10-CM | POA: Insufficient documentation

## 2018-11-06 DIAGNOSIS — K5730 Diverticulosis of large intestine without perforation or abscess without bleeding: Secondary | ICD-10-CM | POA: Insufficient documentation

## 2018-11-06 DIAGNOSIS — K219 Gastro-esophageal reflux disease without esophagitis: Secondary | ICD-10-CM | POA: Diagnosis not present

## 2018-11-06 DIAGNOSIS — K210 Gastro-esophageal reflux disease with esophagitis: Secondary | ICD-10-CM | POA: Insufficient documentation

## 2018-11-06 DIAGNOSIS — Z1211 Encounter for screening for malignant neoplasm of colon: Secondary | ICD-10-CM | POA: Diagnosis not present

## 2018-11-06 DIAGNOSIS — D128 Benign neoplasm of rectum: Secondary | ICD-10-CM | POA: Diagnosis not present

## 2018-11-06 DIAGNOSIS — K317 Polyp of stomach and duodenum: Secondary | ICD-10-CM | POA: Insufficient documentation

## 2018-11-06 DIAGNOSIS — D126 Benign neoplasm of colon, unspecified: Secondary | ICD-10-CM | POA: Diagnosis not present

## 2018-11-06 DIAGNOSIS — K635 Polyp of colon: Secondary | ICD-10-CM | POA: Diagnosis not present

## 2018-11-06 DIAGNOSIS — E785 Hyperlipidemia, unspecified: Secondary | ICD-10-CM | POA: Diagnosis not present

## 2018-11-06 DIAGNOSIS — K621 Rectal polyp: Secondary | ICD-10-CM | POA: Insufficient documentation

## 2018-11-06 DIAGNOSIS — K5790 Diverticulosis of intestine, part unspecified, without perforation or abscess without bleeding: Secondary | ICD-10-CM | POA: Diagnosis not present

## 2018-11-06 DIAGNOSIS — R1013 Epigastric pain: Secondary | ICD-10-CM | POA: Diagnosis not present

## 2018-11-06 HISTORY — DX: Disorder of lipoprotein metabolism, unspecified: E78.9

## 2018-11-06 HISTORY — PX: COLONOSCOPY WITH PROPOFOL: SHX5780

## 2018-11-06 HISTORY — DX: Pure hypercholesterolemia, unspecified: E78.00

## 2018-11-06 HISTORY — PX: ESOPHAGOGASTRODUODENOSCOPY (EGD) WITH PROPOFOL: SHX5813

## 2018-11-06 SURGERY — COLONOSCOPY WITH PROPOFOL
Anesthesia: General

## 2018-11-06 MED ORDER — PROPOFOL 500 MG/50ML IV EMUL
INTRAVENOUS | Status: DC | PRN
  Administered 2018-11-06: 120 ug/kg/min via INTRAVENOUS

## 2018-11-06 MED ORDER — EPHEDRINE SULFATE 50 MG/ML IJ SOLN
INTRAMUSCULAR | Status: DC | PRN
  Administered 2018-11-06: 10 mg via INTRAVENOUS
  Administered 2018-11-06 (×2): 5 mg via INTRAVENOUS
  Administered 2018-11-06: 10 mg via INTRAVENOUS

## 2018-11-06 MED ORDER — SODIUM CHLORIDE 0.9 % IV SOLN
INTRAVENOUS | Status: DC
  Administered 2018-11-06: 1000 mL via INTRAVENOUS

## 2018-11-06 MED ORDER — MIDAZOLAM HCL 2 MG/2ML IJ SOLN
INTRAMUSCULAR | Status: DC | PRN
  Administered 2018-11-06: 2 mg via INTRAVENOUS

## 2018-11-06 MED ORDER — FENTANYL CITRATE (PF) 100 MCG/2ML IJ SOLN
INTRAMUSCULAR | Status: AC
  Filled 2018-11-06: qty 2

## 2018-11-06 MED ORDER — EPHEDRINE SULFATE 50 MG/ML IJ SOLN
INTRAMUSCULAR | Status: AC
  Filled 2018-11-06: qty 1

## 2018-11-06 MED ORDER — PROPOFOL 500 MG/50ML IV EMUL
INTRAVENOUS | Status: AC
  Filled 2018-11-06: qty 50

## 2018-11-06 MED ORDER — PHENYLEPHRINE HCL 10 MG/ML IJ SOLN
INTRAMUSCULAR | Status: AC
  Filled 2018-11-06: qty 1

## 2018-11-06 MED ORDER — LIDOCAINE HCL (PF) 1 % IJ SOLN
INTRAMUSCULAR | Status: AC
  Filled 2018-11-06: qty 2

## 2018-11-06 MED ORDER — PROPOFOL 10 MG/ML IV BOLUS
INTRAVENOUS | Status: AC
  Filled 2018-11-06: qty 20

## 2018-11-06 MED ORDER — LIDOCAINE HCL (PF) 2 % IJ SOLN
INTRAMUSCULAR | Status: AC
  Filled 2018-11-06: qty 10

## 2018-11-06 MED ORDER — PHENYLEPHRINE HCL 10 MG/ML IJ SOLN
INTRAMUSCULAR | Status: DC | PRN
  Administered 2018-11-06 (×5): 50 ug via INTRAVENOUS

## 2018-11-06 MED ORDER — LIDOCAINE HCL (CARDIAC) PF 100 MG/5ML IV SOSY
PREFILLED_SYRINGE | INTRAVENOUS | Status: DC | PRN
  Administered 2018-11-06: 30 mg via INTRAVENOUS

## 2018-11-06 MED ORDER — LIDOCAINE HCL (PF) 1 % IJ SOLN
INTRAMUSCULAR | Status: AC
Start: 2018-11-06 — End: 2018-11-06
  Administered 2018-11-06: 0.3 mL via INTRADERMAL
  Filled 2018-11-06: qty 2

## 2018-11-06 MED ORDER — MIDAZOLAM HCL 2 MG/2ML IJ SOLN
INTRAMUSCULAR | Status: AC
  Filled 2018-11-06: qty 2

## 2018-11-06 MED ORDER — LIDOCAINE HCL (PF) 1 % IJ SOLN
2.0000 mL | Freq: Once | INTRAMUSCULAR | Status: AC
  Administered 2018-11-06: 0.3 mL via INTRADERMAL

## 2018-11-06 MED ORDER — FENTANYL CITRATE (PF) 100 MCG/2ML IJ SOLN
INTRAMUSCULAR | Status: DC | PRN
  Administered 2018-11-06: 50 ug via INTRAVENOUS

## 2018-11-06 NOTE — Anesthesia Procedure Notes (Signed)
Performed by: Cook-Martin, De [place] Pre-anesthesia Checklist: Patient identified, Emergency Drugs available, Suction available, Patient being monitored and Timeout performed Patient Re-evaluated:Patient Re-evaluated prior to induction Oxygen Delivery Method: Nasal cannula Preoxygenation: Pre-oxygenation with 100% oxygen Induction Type: IV induction Airway Equipment and Method: Bite block Placement Confirmation: positive ETCO2 and CO2 detector       

## 2018-11-06 NOTE — Op Note (Signed)
[facility] Gastroenterology Patient Name: Holly Matthews Procedure Date: 11/06/2018 12:42 PM MRN: [id number] Account #: [id number] Date of Birth: [birth year] Admit Type: Outpatient Age: 67 Room: [facility] ENDO ROOM 3 Gender: Female Note Status: Finalized Procedure:            Upper GI endoscopy Indications:          Dyspepsia, Gastro-esophageal reflux disease, Failure to                        respond to medical treatment Providers:            Lollie Sails, MD Referring MD:         Mar Daring (Referring MD) Medicines:            Monitored Anesthesia Care Complications:        No immediate complications. Procedure:            Pre-Anesthesia Assessment:                       - ASA Grade Assessment: II - A patient with mild                        systemic disease.                       After obtaining informed consent, the endoscope was                        passed under direct vision. Throughout the procedure,                        the patient's blood pressure, pulse, and oxygen                        saturations were monitored continuously. The Endoscope                        was introduced through the mouth, and advanced to the                        third part of duodenum. The upper GI endoscopy was                        accomplished without difficulty. The patient tolerated                        the procedure well. Findings:      LA Grade C (one or more mucosal breaks continuous between tops of 2 or       more mucosal folds, less than 75% circumference) esophagitis with no       bleeding was found. Biopsies were taken with a cold forceps for       histology.      The exam of the esophagus was otherwise normal.      Diffuse mild inflammation characterized by congestion (edema) was found       in the gastric body. Biopsies were taken with a cold forceps for       histology from the antrum, distal and proximal gastric body, and placed       into  separate jars. The edema seemes limited to the gastric  body, with       much less irritation in the antrum.      A small hiatal hernia was found. The Z-line was a variable distance from       incisors; the hiatal hernia was sliding.      A single 3 mm sessile polyp with no bleeding and no stigmata of recent       bleeding was found in the cardia. The polyp was removed with a cold       biopsy forceps. Resection and retrieval were complete.      Multiple 1 to 7 mm semi-sessile polyps with no bleeding and no stigmata       of recent bleeding were found in the gastric body. Biopsies were taken       with a cold forceps for histology.      The cardia and gastric fundus were normal on retroflexion otherwise.      The examined duodenum was normal. Biopsies were taken with a cold       forceps for histology. Impression:           - LA Grade C erosive esophagitis. Biopsied. Atypical                        appearance of gastric body mucosa, biopsied. Recommendation:       - Await pathology results.                       - Use Protonix (pantoprazole) 40 mg PO BID daily. Check                        serum gastrin today, may need to repeat 10 days off PPI. Procedure Code(s):    --- Professional ---                       [phone], Esophagogastroduodenoscopy, flexible, transoral;                        with biopsy, single or multiple Diagnosis Code(s):    --- Professional ---                       K20.8, Other esophagitis                       R10.13, Epigastric pain                       K21.9, Gastro-esophageal reflux disease without                        esophagitis CPT copyright 2018 American Medical Association. All rights reserved. The codes documented in this report are preliminary and upon coder review may  be revised to meet current compliance requirements. Lollie Sails, MD 11/06/2018 2:19:11 PM This report has been signed electronically. Number of Addenda: 0 Note Initiated On:  11/06/2018 12:42 PM      [facility]

## 2018-11-06 NOTE — Op Note (Addendum)
[facility] Gastroenterology Patient Name: Holly Matthews Procedure Date: 11/06/2018 12:41 PM MRN: [id number] Account #: [id number] Date of Birth: May 04, 1951 Admit Type: Outpatient Age: 67 Room: [facility] ENDO ROOM 3 Gender: Female Note Status: Finalized Procedure:            Colonoscopy Indications:          Personal history of colonic polyps Providers:            Lollie Sails, MD Referring MD:         Mar Daring (Referring MD) Medicines:            Monitored Anesthesia Care Complications:        No immediate complications. Procedure:            Pre-Anesthesia Assessment:                       - ASA Grade Assessment: II - A patient with mild                        systemic disease.                       After obtaining informed consent, the colonoscope was                        passed under direct vision. Throughout the procedure,                        the patient's blood pressure, pulse, and oxygen                        saturations were monitored continuously. The                        Colonoscope was introduced through the anus and                        advanced to the the cecum, identified by appendiceal                        orifice and ileocecal valve. The quality of the bowel                        preparation was good. Findings:      A 6 mm polyp was found in the distal sigmoid colon. The polyp was       semi-pedunculated. The polyp was removed with a lift and cut technique       using a hot snare. Resection and retrieval were complete. To prevent       bleeding after the polypectomy, one hemostatic clip was successfully       placed (MR conditional). There was no bleeding at the end of the       maneuver.      A 2 mm polyp was found in the rectum. The polyp was sessile. The polyp       was removed with a cold biopsy forceps. Resection and retrieval were       complete.      Multiple small-mouthed diverticula were found in the sigmoid  colon and       distal descending colon.      Non-bleeding external  and internal hemorrhoids were found during       anoscopy. The hemorrhoids were small and Grade I (internal hemorrhoids       that do not prolapse).      No additional abnormalities were found on retroflexion.      The exam was otherwise without abnormality. Impression:           - One 6 mm polyp in the distal sigmoid colon, removed                        using lift and cut and a hot snare. Resected and                        retrieved. Clip (MR conditional) was placed.                       - One 2 mm polyp in the rectum, removed with a cold                        biopsy forceps. Resected and retrieved.                       - Diverticulosis in the sigmoid colon and in the distal                        descending colon.                       - Non-bleeding external and internal hemorrhoids.                       - The examination was otherwise normal. Recommendation:       - Discharge patient to home.                       - Await pathology results. Procedure Code(s):    --- Professional ---                       [id number], Colonoscopy, flexible; with removal of tumor(s),                        polyp(s), or other lesion(s) by snare technique                       45380, 33, Colonoscopy, flexible; with biopsy, single                        or multiple Diagnosis Code(s):    --- Professional ---                       D12.5, Benign neoplasm of sigmoid colon                       K62.1, Rectal polyp                       K64.0, First degree hemorrhoids                       Z86.010, Personal history of colonic polyps  K57.30, Diverticulosis of large intestine without                        perforation or abscess without bleeding CPT copyright 2018 American Medical Association. All rights reserved. The codes documented in this report are preliminary and upon coder review may  be revised to meet current  compliance requirements. Lollie Sails, MD 11/06/2018 2:51:57 PM This report has been signed electronically. Number of Addenda: 0 Note Initiated On: 11/06/2018 12:41 PM Scope Withdrawal Time: 0 hours 13 minutes 29 seconds  Total Procedure Duration: 0 hours 24 minutes 37 seconds       [facility]

## 2018-11-06 NOTE — Anesthesia Post-op Follow-up Note (Signed)
Anesthesia QCDR form completed.        

## 2018-11-06 NOTE — H&P (Signed)
Outpatient short stay form Pre-procedure 11/06/2018 12:20 PM Holly Sails MD  Primary Physician: Fenton Malling, PA  Reason for visit: EGD and colonoscopy  History of present illness: Patient is a 67 year old female presents today for EGD and colonoscopy.  She has been having problems with dyspepsia and reflux that her medication does not seem to be helping.  She was increased on Protonix from 40 mg daily to twice a day and that has been of some benefit but has not resolved symptoms.  She is currently not taking per Carafate.  She has not had a gallbladder evaluation.  Also presenting for a colonoscopy most the EGD, the colonoscopy being 4 personal history of adenomatous colon polyps.  She tolerated her prep well.  She takes no aspirin or blood thinning agent.  She takes no NSAIDs except occasionally.    Current Facility-Administered Medications:  .  lidocaine (PF) (XYLOCAINE) 1 % injection, , , ,  .  0.9 %  sodium chloride infusion, , Intravenous, Continuous, [place], Martin U, MD .  lidocaine (PF) (XYLOCAINE) 1 % injection 2 mL, 2 mL, Intradermal, Once, Holly Sails, MD  Medications Prior to Admission  Medication Sig Dispense Refill Last Dose  . atorvastatin (LIPITOR) 10 MG tablet TAKE 1 TABLET(10 MG) BY MOUTH DAILY 90 tablet 1 11/06/2018 at 0600  . pantoprazole (PROTONIX) 40 MG tablet Take 1 tablet (40 mg total) by mouth daily. 90 tablet 1 11/06/2018 at 0600     Allergies  Allergen Reactions  . Codeine      Past Medical History:  Diagnosis Date  . Allergy   . Diabetes mellitus without complication ([place])    Diet controlled  . GERD (gastroesophageal reflux disease)   . Hyperlipidemia   . Serum cholesterol elevated     Review of systems:      Physical Exam    Heart and lungs: Regular rate and rhythm without rub or gallop, lungs are bilaterally clear.    HEENT: Normocephalic atraumatic eyes are anicteric    Other:    Pertinant exam for procedure: Soft  nontender nondistended bowel sounds positive normoactive.    Planned proceedures: EGD, colonoscopy and indicated procedures. I have discussed the risks benefits and complications of procedures to include not limited to bleeding, infection, perforation and the risk of sedation and the patient wishes to proceed.      Holly Sails, MD Gastroenterology 11/06/2018  12:20 PM

## 2018-11-06 NOTE — Transfer of Care (Signed)
Immediate Anesthesia Transfer of Care Note  Patient: Holly Matthews  Procedure(s) Performed: COLONOSCOPY WITH PROPOFOL (N/A ) ESOPHAGOGASTRODUODENOSCOPY (EGD) WITH PROPOFOL (N/A )  Patient Location: PACU  Anesthesia Type:General  Level of Consciousness: awake and sedated  Airway & Oxygen Therapy: Patient Spontanous Breathing and Patient connected to nasal cannula oxygen  Post-op Assessment: Report given to RN and Post -op Vital signs reviewed and stable  Post vital signs: Reviewed and stable  Last Vitals:  Vitals Value Taken Time  BP    Temp    Pulse    Resp    SpO2      Last Pain:  Vitals:   11/06/18 1207  TempSrc: Tympanic  PainSc: 0-No pain         Complications: No apparent anesthesia complications

## 2018-11-06 NOTE — Anesthesia Preprocedure Evaluation (Signed)
Anesthesia Evaluation  Patient identified by MRN, date of birth, ID band Patient awake    Reviewed: Allergy & Precautions, NPO status , Patient's Chart, lab work & pertinent test results  History of Anesthesia Complications Negative for: history of anesthetic complications  Airway Mallampati: II  TM Distance: >3 FB Neck ROM: Full    Dental no notable dental hx.    Pulmonary neg pulmonary ROS, neg sleep apnea, neg COPD,    breath sounds clear to auscultation- rhonchi (-) wheezing      Cardiovascular Exercise Tolerance: Good (-) hypertension(-) CAD, (-) Past MI, (-) Cardiac Stents and (-) CABG  Rhythm:Regular Rate:Normal - Systolic murmurs and - Diastolic murmurs    Neuro/Psych negative neurological ROS  negative psych ROS   GI/Hepatic Neg liver ROS, GERD  ,  Endo/Other  diabetes (diet controlled)  Renal/GU negative Renal ROS     Musculoskeletal negative musculoskeletal ROS (+)   Abdominal (+) - obese,   Peds  Hematology negative hematology ROS (+)   Anesthesia Other Findings Past Medical History: No date: Allergy No date: Diabetes mellitus without complication (HCC)     Comment:  Diet controlled No date: GERD (gastroesophageal reflux disease) No date: Hyperlipidemia No date: Serum cholesterol elevated   Reproductive/Obstetrics                             Anesthesia Physical Anesthesia Plan  ASA: II  Anesthesia Plan: General   Post-op Pain Management:    Induction: Intravenous  PONV Risk Score and Plan: 2 and Propofol infusion  Airway Management Planned: Natural Airway  Additional Equipment:   Intra-op Plan:   Post-operative Plan:   Informed Consent: I have reviewed the patients History and Physical, chart, labs and discussed the procedure including the risks, benefits and alternatives for the proposed anesthesia with the patient or authorized representative who has  indicated his/her understanding and acceptance.   Dental advisory given  Plan Discussed with: CRNA and Anesthesiologist  Anesthesia Plan Comments:         Anesthesia Quick Evaluation

## 2018-11-07 NOTE — Anesthesia Postprocedure Evaluation (Signed)
Anesthesia Post Note  Patient: Ilina Xu [place]um  Procedure(s) Performed: COLONOSCOPY WITH PROPOFOL (N/A ) ESOPHAGOGASTRODUODENOSCOPY (EGD) WITH PROPOFOL (N/A )  Patient location during evaluation: Endoscopy Anesthesia Type: General Level of consciousness: awake and alert and oriented Pain management: pain level controlled Vital Signs Assessment: post-procedure vital signs reviewed and stable Respiratory status: spontaneous breathing, nonlabored ventilation and respiratory function stable Cardiovascular status: blood pressure returned to baseline and stable Postop Assessment: no signs of nausea or vomiting Anesthetic complications: no     Last Vitals:  Vitals:   11/06/18 1502 11/06/18 1512  BP: (!) 94/55 103/62  Pulse: 85 79  Resp: 13 17  Temp:    SpO2: 96% 96%    Last Pain:  Vitals:   11/07/18 0735  TempSrc:   PainSc: 0-No pain                 Sherida Dobkins

## 2018-11-08 LAB — SURGICAL PATHOLOGY

## 2018-11-10 ENCOUNTER — Encounter: Payer: Self-pay | Admitting: Gastroenterology

## 2018-12-05 DIAGNOSIS — K210 Gastro-esophageal reflux disease with esophagitis: Secondary | ICD-10-CM | POA: Diagnosis not present

## 2018-12-05 DIAGNOSIS — R1013 Epigastric pain: Secondary | ICD-10-CM | POA: Diagnosis not present

## 2018-12-05 DIAGNOSIS — K2960 Other gastritis without bleeding: Secondary | ICD-10-CM | POA: Diagnosis not present

## 2018-12-06 ENCOUNTER — Ambulatory Visit (INDEPENDENT_AMBULATORY_CARE_PROVIDER_SITE_OTHER): Payer: PPO | Admitting: Physician Assistant

## 2018-12-06 ENCOUNTER — Encounter: Payer: Self-pay | Admitting: Physician Assistant

## 2018-12-06 VITALS — BP 110/75 | HR 70 | Temp 98.1°F | Resp 16 | Ht 59.25 in | Wt 128.4 lb

## 2018-12-06 DIAGNOSIS — D709 Neutropenia, unspecified: Secondary | ICD-10-CM | POA: Diagnosis not present

## 2018-12-06 DIAGNOSIS — E119 Type 2 diabetes mellitus without complications: Secondary | ICD-10-CM

## 2018-12-06 DIAGNOSIS — Z0000 Encounter for general adult medical examination without abnormal findings: Secondary | ICD-10-CM | POA: Diagnosis not present

## 2018-12-06 DIAGNOSIS — Z23 Encounter for immunization: Secondary | ICD-10-CM | POA: Diagnosis not present

## 2018-12-06 DIAGNOSIS — Z1239 Encounter for other screening for malignant neoplasm of breast: Secondary | ICD-10-CM

## 2018-12-06 DIAGNOSIS — E7800 Pure hypercholesterolemia, unspecified: Secondary | ICD-10-CM | POA: Diagnosis not present

## 2018-12-06 LAB — POCT UA - MICROALBUMIN: Microalbumin Ur, POC: 50 mg/L

## 2018-12-06 NOTE — Patient Instructions (Signed)
Health Maintenance After Age 67 After age 67, you are at a higher risk for certain long-term diseases and infections as well as injuries from falls. Falls are a major cause of broken bones and head injuries in people who are older than age 67. Getting regular preventive care can help to keep you healthy and well. Preventive care includes getting regular testing and making lifestyle changes as recommended by your health care provider. Talk with your health care provider about:  Which screenings and tests you should have. A screening is a test that checks for a disease when you have no symptoms.  A diet and exercise plan that is right for you. What should I know about screenings and tests to prevent falls? Screening and testing are the best ways to find a health problem early. Early diagnosis and treatment give you the best chance of managing medical conditions that are common after age 67. Certain conditions and lifestyle choices may make you more likely to have a fall. Your health care provider may recommend:  Regular vision checks. Poor vision and conditions such as cataracts can make you more likely to have a fall. If you wear glasses, make sure to get your prescription updated if your vision changes.  Medicine review. Work with your health care provider to regularly review all of the medicines you are taking, including over-the-counter medicines. Ask your health care provider about any side effects that may make you more likely to have a fall. Tell your health care provider if any medicines that you take make you feel dizzy or sleepy.  Osteoporosis screening. Osteoporosis is a condition that causes the bones to get weaker. This can make the bones weak and cause them to break more easily.  Blood pressure screening. Blood pressure changes and medicines to control blood pressure can make you feel dizzy.  Strength and balance checks. Your health care provider may recommend certain tests to check your  strength and balance while standing, walking, or changing positions.  Foot health exam. Foot pain and numbness, as well as not wearing proper footwear, can make you more likely to have a fall.  Depression screening. You may be more likely to have a fall if you have a fear of falling, feel emotionally low, or feel unable to do activities that you used to do.  Alcohol use screening. Using too much alcohol can affect your balance and may make you more likely to have a fall. What actions can I take to lower my risk of falls? General instructions  Talk with your health care provider about your risks for falling. Tell your health care provider if: ? You fall. Be sure to tell your health care provider about all falls, even ones that seem minor. ? You feel dizzy, sleepy, or off-balance.  Take over-the-counter and prescription medicines only as told by your health care provider. These include any supplements.  Eat a healthy diet and maintain a healthy weight. A healthy diet includes low-fat dairy products, low-fat (lean) meats, and fiber from whole grains, beans, and lots of fruits and vegetables. Home safety  Remove any tripping hazards, such as rugs, cords, and clutter.  Install safety equipment such as grab bars in bathrooms and safety rails on stairs.  Keep rooms and walkways well-lit. Activity   Follow a regular exercise program to stay fit. This will help you maintain your balance. Ask your health care provider what types of exercise are appropriate for you.  If you need a cane or   walker, use it as recommended by your health care provider.  Wear supportive shoes that have nonskid soles. Lifestyle  Do not drink alcohol if your health care provider tells you not to drink.  If you drink alcohol, limit how much you have: ? 0-1 drink a day for women. ? 0-2 drinks a day for men.  Be aware of how much alcohol is in your drink. In the [place] one drink equals one typical bottle of beer (12  oz), one-half glass of wine (5 oz), or one shot of hard liquor (1 oz).  Do not use any products that contain nicotine or tobacco, such as cigarettes and e-cigarettes. If you need help quitting, ask your health care provider. Summary  Having a healthy lifestyle and getting preventive care can help to protect your health and wellness after age 67.  Screening and testing are the best way to find a health problem early and help you avoid having a fall. Early diagnosis and treatment give you the best chance for managing medical conditions that are more common for people who are older than age 67.  Falls are a major cause of broken bones and head injuries in people who are older than age 67. Take precautions to prevent a fall at home.  Work with your health care provider to learn what changes you can make to improve your health and wellness and to prevent falls. This information is not intended to replace advice given to you by your health care provider. Make sure you discuss any questions you have with your health care provider. Document Released: 10/19/2017 Document Revised: 10/19/2017 Document Reviewed: 10/19/2017 Elsevier Interactive Patient Education  2019 Elsevier Inc.  

## 2018-12-06 NOTE — Progress Notes (Signed)
Patient: Holly Matthews, Female    DOB: 25-Mar-1951, 67 y.o.   MRN: [id number] Visit Date: 12/06/2018  Today's Provider: Mar Daring, PA-C   Chief Complaint  Patient presents with  . Medicare Wellness   Subjective:    Annual wellness visit Holly Matthews is a 67 y.o. female. She feels well. She reports exercising none. She reports she is sleeping fairly well.  12/02/18 AWE 03/29/17 Mammogram-BI-RADS 1 11/06/18 Colonoscopy-polyps-tubular adenoma ------------------------------------------------------------   Review of Systems  Constitutional: Negative.   HENT: Negative.   Eyes: Negative.   Respiratory: Negative.   Cardiovascular: Negative.   Gastrointestinal: Negative.   Endocrine: Negative.   Genitourinary: Negative.   Musculoskeletal: Negative.   Skin: Negative.   Allergic/Immunologic: Negative.   Neurological: Negative.   Hematological: Negative.   Psychiatric/Behavioral: Negative.     Social History   Socioeconomic History  . Marital status: Widowed    Spouse name: Not on file  . Number of children: 2  . Years of education: College  . Highest education level: Not on file  Occupational History    Comment: Part-Time    Employer: [employer]  Social Needs  . Financial resource strain: Not on file  . Food insecurity:    Worry: Not on file    Inability: Not on file  . Transportation needs:    Medical: Not on file    Non-medical: Not on file  Tobacco Use  . Smoking status: Never Smoker  . Smokeless tobacco: Never Used  Substance and Sexual Activity  . Alcohol use: No  . Drug use: No  . Sexual activity: Not Currently  Lifestyle  . Physical activity:    Days per week: Not on file    Minutes per session: Not on file  . Stress: Not on file  Relationships  . Social connections:    Talks on phone: Not on file    Gets together: Not on file    Attends religious service: Not on file    Active member of club or organization: Not on file    Attends meetings of clubs or organizations: Not on file    Relationship status: Not on file  . Intimate partner violence:    Fear of current or ex partner: Not on file    Emotionally abused: Not on file    Physically abused: Not on file    Forced sexual activity: Not on file  Other Topics Concern  . Not on file  Social History Narrative  . Not on file    Past Medical History:  Diagnosis Date  . Allergy   . Diabetes mellitus without complication ([place])    Diet controlled  . GERD (gastroesophageal reflux disease)   . Hyperlipidemia   . Serum cholesterol elevated      Patient Active Problem List   Diagnosis Date Noted  . Allergic rhinitis 05/06/2015  . Acid reflux 05/06/2015  . History of colon polyps 05/06/2015  . H/O renal calculi 05/06/2015  . Hypercholesteremia 05/06/2015  . Irregular cardiac rhythm 05/06/2015  . Neutropenia ([place]) 05/06/2015  . Diabetes mellitus, type 2 ([place]) 05/06/2015  . Adenomatous polyp 05/06/2015    Past Surgical History:  Procedure Laterality Date  . ABDOMINAL HYSTERECTOMY  1980  . APPENDECTOMY  1967  . COLONOSCOPY    . COLONOSCOPY WITH PROPOFOL N/A 11/06/2018   Procedure: COLONOSCOPY WITH PROPOFOL;  Surgeon: Lollie Sails, MD;  Location[facility] ENDOSCOPY;  Service: Endoscopy;  Laterality: N/A;  .  ESOPHAGOGASTRODUODENOSCOPY    . ESOPHAGOGASTRODUODENOSCOPY (EGD) WITH PROPOFOL N/A 11/06/2018   Procedure: ESOPHAGOGASTRODUODENOSCOPY (EGD) WITH PROPOFOL;  Surgeon: Lollie Sails, MD;  Location: [facility] ENDOSCOPY;  Service: Endoscopy;  Laterality: N/A;    Her family history includes CVA in her father; Colon cancer in her maternal aunt; Diabetes in her son; Healthy in her brother, sister, and sister; Hypertension in her father.      Current Outpatient Medications:  .  atorvastatin (LIPITOR) 10 MG tablet, TAKE 1 TABLET(10 MG) BY MOUTH DAILY, Disp: 90 tablet, Rfl: 1 .  calcium-vitamin D (OSCAL WITH D) 500-200 MG-UNIT TABS tablet, Take 2 tablets  by mouth daily. , Disp: , Rfl:  .  pantoprazole (PROTONIX) 40 MG tablet, Take 1 tablet (40 mg total) by mouth daily. (Patient taking differently: Take 40 mg by mouth 2 (two) times daily. ), Disp: 90 tablet, Rfl: 1  Patient Care Team: Mar Daring, PA-C as PCP - General (Family Medicine)     Objective:   Vitals: BP 110/75 (BP Location: Left Arm, Patient Position: Sitting, Cuff Size: Normal)   Pulse 70   Temp 98.1 F (36.7 C) (Oral)   Resp 16   Ht 4' 11.25" (1.505 m)   Wt 128 lb 6.4 oz (58.2 kg)   BMI 25.72 kg/m   Physical Exam Vitals signs reviewed.  Constitutional:      General: She is not in acute distress.    Appearance: She is well-developed. She is not diaphoretic.  HENT:     Head: Normocephalic and atraumatic.     Right Ear: Hearing, tympanic membrane, ear canal and external ear normal.     Left Ear: Hearing, tympanic membrane, ear canal and external ear normal.     Nose: Nose normal.     Mouth/Throat:     Lips: Pink.     Tongue: No lesions.     Pharynx: Oropharynx is clear. Uvula midline. No oropharyngeal exudate.  Eyes:     General: No scleral icterus.       Right eye: No discharge.        Left eye: No discharge.     Conjunctiva/sclera: Conjunctivae normal.     Pupils: Pupils are equal, round, and reactive to light.  Neck:     Musculoskeletal: Normal range of motion and neck supple.     Thyroid: No thyromegaly.     Vascular: No JVD.     Trachea: No tracheal deviation.  Cardiovascular:     Rate and Rhythm: Normal rate and regular rhythm.     Heart sounds: Normal heart sounds. No murmur. No friction rub. No gallop.   Pulmonary:     Effort: Pulmonary effort is normal. No respiratory distress.     Breath sounds: Normal breath sounds. No wheezing or rales.  Chest:     Chest wall: No tenderness.     Breasts:        Right: Normal. No swelling, bleeding, inverted nipple, mass, nipple discharge, skin change or tenderness.        Left: Normal. No swelling,  bleeding, inverted nipple, mass, nipple discharge, skin change or tenderness.  Abdominal:     General: Bowel sounds are normal. There is no distension.     Palpations: Abdomen is soft. There is no mass.     Tenderness: There is no abdominal tenderness. There is no guarding or rebound.  Musculoskeletal: Normal range of motion.        General: No tenderness.  Lymphadenopathy:  Cervical: No cervical adenopathy.     Upper Body:     Right upper body: No supraclavicular, axillary or pectoral adenopathy.     Left upper body: No supraclavicular, axillary or pectoral adenopathy.  Skin:    General: Skin is warm and dry.     Findings: No rash.  Neurological:     Mental Status: She is alert and oriented to person, place, and time.  Psychiatric:        Behavior: Behavior normal.        Thought Content: Thought content normal.        Judgment: Judgment normal.    Diabetic Foot Exam - Simple   Simple Foot Form Diabetic Foot exam was performed with the following findings:  Yes 12/06/2018  9:41 AM  Visual Inspection No deformities, no ulcerations, no other skin breakdown bilaterally:  Yes Sensation Testing Intact to touch and monofilament testing bilaterally:  Yes Pulse Check Posterior Tibialis and Dorsalis pulse intact bilaterally:  Yes Comments    Activities of Daily Living In your present state of health, do you have any difficulty performing the following activities: 12/06/2018  Hearing? N  Vision? N  Difficulty concentrating or making decisions? N  Walking or climbing stairs? N  Dressing or bathing? N  Doing errands, shopping? N  Some recent data might be hidden    Fall Risk Assessment Fall Risk  12/06/2018 11/25/2017 11/24/2016  Falls in the past year? 0 No No     Depression Screen PHQ 2/9 Scores 12/06/2018 11/25/2017 11/24/2016  PHQ - 2 Score 0 0 0  PHQ- 9 Score 0 - -   Cognitive Testing - 6-CIT  Correct? Score   What year is it? yes 0 0 or 4  What month is it? yes 0 0  or 3  Memorize:    Pia Mau,  42,  [place],      What time is it? (within 1 hour) yes 0 0 or 3  Count backwards from 20 yes 0 0, 2, or 4  Name the months of the year yes 0 0, 2, or 4  Repeat name & address above yes 0 0, 2, 4, 6, 8, or 10       TOTAL SCORE  0/28   Interpretation:  Normal  Normal (0-7) Abnormal (8-28)     No flowsheet data found.   Assessment & Plan:     Annual Wellness Visit  Reviewed patient's Family Medical History Reviewed and updated list of patient's medical providers Assessment of cognitive impairment was done Assessed patient's functional ability Established a written schedule for health screening [place] Completed and Reviewed  Exercise Activities and Dietary recommendations Goals    . Exercise 3x per week (30 min per time)     Recommend to start back exercising for 3 days a week for 30-45 minutes.        Immunization History  Administered Date(s) Administered  . Influenza Split 10/25/2011  . Influenza, High Dose Seasonal PF 11/24/2016, 11/25/2017  . Pneumococcal Conjugate-13 11/24/2016  . Pneumococcal Polysaccharide-23 05/16/2018    Health Maintenance  Topic Date Due  . Samul Dada  02/16/1970  . INFLUENZA VACCINE  07/20/2018  . FOOT EXAM  07/27/2018  . URINE MICROALBUMIN  07/28/2018  . HEMOGLOBIN A1C  02/16/2019  . MAMMOGRAM  03/30/2019  . OPHTHALMOLOGY EXAM  09/07/2019  . COLONOSCOPY  11/07/2023  . DEXA SCAN  Completed  . Hepatitis C Screening  Completed  .  PNA vac Low Risk Adult  Completed     Discussed health benefits of physical activity, and encouraged her to engage in regular exercise appropriate for her age and condition.    1. Annual physical exam Normal physical exam today. Will check labs as below and f/u pending lab results. If labs are stable and WNL she will not need to have these rechecked for one year at her next annual physical exam. She is to call the office in the  meantime if she has any acute issue, questions or concerns. - CBC w/Diff/Platelet - Comprehensive Metabolic Panel (CMET) - Lipid Profile - HgB A1c  2. Breast cancer screening Breast exam today was normal. There is no family history of breast cancer. She does perform regular self breast exams. Mammogram was ordered as below. Information for [facility] was given to patient so she may schedule her mammogram at her convenience.  3. Type 2 diabetes mellitus without complication, without long-term current use of insulin (HCC) Diet controlled. Microalbumin normal. Will check labs as below and f/u pending results.  - CBC w/Diff/Platelet - Comprehensive Metabolic Panel (CMET) - Lipid Profile - HgB A1c - POCT UA - Microalbumin  4. Hypercholesteremia Stable and controlled on atorvastatin 10mg . Will check labs as below and f/u pending results. - CBC w/Diff/Platelet - Comprehensive Metabolic Panel (CMET) - Lipid Profile - HgB A1c  5. Neutropenia, unspecified type ([place]) Stable. Will check labs as below and f/u pending results. - CBC w/Diff/Platelet - Comprehensive Metabolic Panel (CMET)  6. Need for influenza vaccination Flu vaccine given today without complication. Patient sat upright for 15 minutes to check for adverse reaction before being released. Patient declined high dose vaccine, wanted regular.  - Flu Vaccine QUAD 36+ mos IM  ------------------------------------------------------------------------------------------------------------    Mar Daring, PA-C  [facility]

## 2018-12-07 LAB — COMPREHENSIVE METABOLIC PANEL
A/G RATIO: 1.6 (ref 1.2–2.2)
ALBUMIN: 4.5 g/dL (ref 3.6–4.8)
ALT: 17 IU/L (ref 0–32)
AST: 13 IU/L (ref 0–40)
Alkaline Phosphatase: 52 IU/L (ref 39–117)
BILIRUBIN TOTAL: 0.4 mg/dL (ref 0.0–1.2)
BUN / CREAT RATIO: 19 (ref 12–28)
BUN: 15 mg/dL (ref 8–27)
CALCIUM: 9.7 mg/dL (ref 8.7–10.3)
CHLORIDE: 105 mmol/L (ref 96–106)
CO2: 22 mmol/L (ref 20–29)
Creatinine, Ser: 0.81 mg/dL (ref 0.57–1.00)
GFR, EST AFRICAN AMERICAN: 87 mL/min/{1.73_m2} (ref >59)
GFR, EST NON AFRICAN AMERICAN: 75 mL/min/{1.73_m2} (ref >59)
GLOBULIN, TOTAL: 2.8 g/dL (ref 1.5–4.5)
Glucose: 107 mg/dL — ABNORMAL HIGH (ref 65–99)
POTASSIUM: 4 mmol/L (ref 3.5–5.2)
SODIUM: 140 mmol/L (ref 134–144)
Total Protein: 7.3 g/dL (ref 6.0–8.5)

## 2018-12-07 LAB — CBC WITH DIFFERENTIAL/PLATELET
BASOS: 1 %
Basophils Absolute: 0 10*3/uL (ref 0.0–0.2)
EOS (ABSOLUTE): 0.1 10*3/uL (ref 0.0–0.4)
EOS: 3 %
HEMATOCRIT: 39.8 % (ref 34.0–46.6)
HEMOGLOBIN: 12.8 g/dL (ref 11.1–15.9)
IMMATURE GRANS (ABS): 0 10*3/uL (ref 0.0–0.1)
IMMATURE GRANULOCYTES: 0 %
LYMPHS: 39 %
Lymphocytes Absolute: 1.4 10*3/uL (ref 0.7–3.1)
MCH: 28.1 pg (ref 26.6–33.0)
MCHC: 32.2 g/dL (ref 31.5–35.7)
MCV: 88 fL (ref 79–97)
Monocytes Absolute: 0.4 10*3/uL (ref 0.1–0.9)
Monocytes: 12 %
NEUTROS ABS: 1.6 10*3/uL (ref 1.4–7.0)
NEUTROS PCT: 45 %
Platelets: 241 10*3/uL (ref 150–450)
RBC: 4.55 x10E6/uL (ref 3.77–5.28)
RDW: 13 % (ref 12.3–15.4)
WBC: 3.6 10*3/uL (ref 3.4–10.8)

## 2018-12-07 LAB — LIPID PANEL
CHOL/HDL RATIO: 3.4 ratio (ref 0.0–4.4)
Cholesterol, Total: 185 mg/dL (ref 100–199)
HDL: 54 mg/dL (ref >39)
LDL Calculated: 102 mg/dL — ABNORMAL HIGH (ref 0–99)
Triglycerides: 147 mg/dL (ref 0–149)
VLDL Cholesterol Cal: 29 mg/dL (ref 5–40)

## 2018-12-07 LAB — HEMOGLOBIN A1C
ESTIMATED AVERAGE GLUCOSE: 154 mg/dL
HEMOGLOBIN A1C: 7 % — AB (ref 4.8–5.6)

## 2018-12-08 ENCOUNTER — Telehealth: Payer: Self-pay

## 2018-12-08 NOTE — Telephone Encounter (Signed)
Patient was advised of lab report. KW

## 2018-12-14 ENCOUNTER — Other Ambulatory Visit: Payer: Self-pay | Admitting: Physician Assistant

## 2018-12-14 DIAGNOSIS — E7800 Pure hypercholesterolemia, unspecified: Secondary | ICD-10-CM

## 2018-12-27 DIAGNOSIS — K2960 Other gastritis without bleeding: Secondary | ICD-10-CM | POA: Diagnosis not present

## 2019-01-02 ENCOUNTER — Other Ambulatory Visit: Payer: Self-pay | Admitting: Gastroenterology

## 2019-01-02 DIAGNOSIS — R1011 Right upper quadrant pain: Secondary | ICD-10-CM

## 2019-01-15 ENCOUNTER — Ambulatory Visit
Admission: RE | Admit: 2019-01-15 | Discharge: 2019-01-15 | Disposition: A | Discharge status: Home / Self Care | Payer: PPO | Source: Ambulatory Visit | Attending: Gastroenterology | Admitting: Gastroenterology

## 2019-01-15 DIAGNOSIS — R1011 Right upper quadrant pain: Secondary | ICD-10-CM | POA: Insufficient documentation

## 2019-01-15 DIAGNOSIS — K824 Cholesterolosis of gallbladder: Secondary | ICD-10-CM | POA: Diagnosis not present

## 2019-01-15 MED ORDER — TECHNETIUM TC 99M MEBROFENIN IV KIT
5.3170 | PACK | Freq: Once | INTRAVENOUS | Status: AC | PRN
  Administered 2019-01-15: 5.317 via INTRAVENOUS

## 2019-01-16 DIAGNOSIS — E164 Increased secretion of gastrin: Secondary | ICD-10-CM | POA: Diagnosis not present

## 2019-01-22 DIAGNOSIS — K824 Cholesterolosis of gallbladder: Secondary | ICD-10-CM | POA: Diagnosis not present

## 2019-01-22 DIAGNOSIS — K209 Esophagitis, unspecified: Secondary | ICD-10-CM | POA: Diagnosis not present

## 2019-05-16 ENCOUNTER — Other Ambulatory Visit: Payer: Self-pay

## 2019-05-16 ENCOUNTER — Encounter: Payer: Self-pay | Admitting: Physician Assistant

## 2019-05-16 ENCOUNTER — Ambulatory Visit (INDEPENDENT_AMBULATORY_CARE_PROVIDER_SITE_OTHER): Payer: PPO | Admitting: Physician Assistant

## 2019-05-16 VITALS — BP 108/70 | HR 94 | Temp 98.5°F | Resp 16 | Wt 125.0 lb

## 2019-05-16 DIAGNOSIS — E119 Type 2 diabetes mellitus without complications: Secondary | ICD-10-CM | POA: Diagnosis not present

## 2019-05-16 DIAGNOSIS — D709 Neutropenia, unspecified: Secondary | ICD-10-CM

## 2019-05-16 DIAGNOSIS — E7800 Pure hypercholesterolemia, unspecified: Secondary | ICD-10-CM

## 2019-05-16 LAB — POCT GLYCOSYLATED HEMOGLOBIN (HGB A1C)
Est. average glucose Bld gHb Est-mCnc: 151
Hemoglobin A1C: 6.9 % — AB (ref 4.0–5.6)

## 2019-05-16 NOTE — Progress Notes (Signed)
Patient: Holly Matthews Female    DOB: 1950-12-21   69 y.o.   MRN: [id number] Visit Date: 05/16/2019  Today's Provider: Mar Daring, PA-C   Chief Complaint  Patient presents with  . Follow-up    DM and HLD   Subjective:     HPI   Diabetes Mellitus Type II, Follow-up:   Lab Results  Component Value Date   HGBA1C 6.9 (A) 05/16/2019   HGBA1C 7.0 (H) 12/06/2018   HGBA1C 7.2 (A) 08/16/2018    Last seen for diabetes 6 months ago. Diet controlled.  ------------------------------------------------------------------------  Lipid/Cholesterol, Follow-up:   Last seen for this 6 months ago.  Management changes since that visit include none, controlled on Atorvastatin 10 mg. . Last Lipid Panel:    Component Value Date/Time   CHOL 185 12/06/2018 0953   TRIG 147 12/06/2018 0953   HDL 54 12/06/2018 0953   CHOLHDL 3.4 12/06/2018 0953   CHOLHDL 3.5 12/14/2017 0834   LDLCALC 102 (H) 12/06/2018 0953   LDLCALC 113 (H) 12/14/2017 7035    She reports excellent compliance with treatment. She is not having side effects.     Allergies  Allergen Reactions  . Codeine      Current Outpatient Medications:  .  atorvastatin (LIPITOR) 10 MG tablet, TAKE 1 TABLET(10 MG) BY MOUTH DAILY, Disp: 90 tablet, Rfl: 1 .  calcium-vitamin D (OSCAL WITH D) 500-200 MG-UNIT TABS tablet, Take 2 tablets by mouth daily. , Disp: , Rfl:  .  pantoprazole (PROTONIX) 40 MG tablet, Take 1 tablet (40 mg total) by mouth daily. (Patient taking differently: Take 40 mg by mouth 2 (two) times daily. ), Disp: 90 tablet, Rfl: 1  Review of Systems  Constitutional: Negative.   HENT: Negative.   Eyes: Negative.   Respiratory: Negative.   Cardiovascular: Negative.   Gastrointestinal: Negative.   Endocrine: Negative.   Genitourinary: Negative.   Musculoskeletal: Negative.   Skin: Negative.   Allergic/Immunologic: Negative.   Neurological: Negative.   Hematological: Negative.    Psychiatric/Behavioral: Negative.     Social History   Tobacco Use  . Smoking status: Never Smoker  . Smokeless tobacco: Never Used  Substance Use Topics  . Alcohol use: No      Objective:   BP 108/70 (BP Location: Left Arm, Patient Position: Sitting, Cuff Size: Large)   Pulse 94   Temp 98.5 F (36.9 C) (Oral)   Resp 16   Wt 125 lb (56.7 kg)   BMI 25.03 kg/m  Vitals:   05/16/19 0812  BP: 108/70  Pulse: 94  Resp: 16  Temp: 98.5 F (36.9 C)  TempSrc: Oral  Weight: 125 lb (56.7 kg)     Physical Exam Vitals signs reviewed.  Constitutional:      General: She is not in acute distress.    Appearance: Normal appearance. She is well-developed. She is not ill-appearing or diaphoretic.  HENT:     Head: Normocephalic and atraumatic.     Mouth/Throat:     Mouth: Mucous membranes are moist.  Eyes:     General: No scleral icterus.    Extraocular Movements: Extraocular movements intact.  Neck:     Musculoskeletal: Normal range of motion and neck supple.     Thyroid: No thyromegaly.     Vascular: No JVD.     Trachea: No tracheal deviation.  Cardiovascular:     Rate and Rhythm: Normal rate and regular rhythm.     Pulses: Normal pulses.  Heart sounds: Normal heart sounds. No murmur. No friction rub. No gallop.   Pulmonary:     Effort: Pulmonary effort is normal. No respiratory distress.     Breath sounds: Normal breath sounds. No wheezing or rales.  Musculoskeletal:     Right lower leg: No edema.     Left lower leg: No edema.  Lymphadenopathy:     Cervical: No cervical adenopathy.  Skin:    Capillary Refill: Capillary refill takes less than 2 seconds.  Neurological:     Mental Status: She is alert.        Assessment & Plan    1. Type 2 diabetes mellitus without complication, without long-term current use of insulin (HCC) A1c improved to 6.9 from 7.0. Continue healthy lifestyle modifications. I will see her back in 6 months for CPE.  - POCT glycosylated  hemoglobin (Hb A1C) - CBC w/Diff/Platelet - Comprehensive Metabolic Panel (CMET) - Lipid Profile  2. Hypercholesteremia Stable. Continue Atorvastatin 10mg . Will check labs as below and f/u pending results. - CBC w/Diff/Platelet - Comprehensive Metabolic Panel (CMET) - Lipid Profile  3. Neutropenia, unspecified type ([place]) H/O this. Will check labs as below and f/u pending results. - CBC w/Diff/Platelet - Comprehensive Metabolic Panel (CMET) - Lipid Profile     Mar Daring, PA-C  [facility]

## 2019-05-17 LAB — LIPID PANEL
Chol/HDL Ratio: 3.1 ratio (ref 0.0–4.4)
Cholesterol, Total: 165 mg/dL (ref 100–199)
HDL: 53 mg/dL (ref >39)
LDL Calculated: 84 mg/dL (ref 0–99)
Triglycerides: 140 mg/dL (ref 0–149)
VLDL Cholesterol Cal: 28 mg/dL (ref 5–40)

## 2019-05-17 LAB — CBC WITH DIFFERENTIAL/PLATELET
Basophils Absolute: 0.1 10*3/uL (ref 0.0–0.2)
Basos: 2 %
EOS (ABSOLUTE): 0.1 10*3/uL (ref 0.0–0.4)
Eos: 3 %
Hematocrit: 39.9 % (ref 34.0–46.6)
Hemoglobin: 13.4 g/dL (ref 11.1–15.9)
Immature Grans (Abs): 0 10*3/uL (ref 0.0–0.1)
Immature Granulocytes: 0 %
Lymphocytes Absolute: 1.3 10*3/uL (ref 0.7–3.1)
Lymphs: 32 %
MCH: 29 pg (ref 26.6–33.0)
MCHC: 33.6 g/dL (ref 31.5–35.7)
MCV: 86 fL (ref 79–97)
Monocytes Absolute: 0.4 10*3/uL (ref 0.1–0.9)
Monocytes: 11 %
Neutrophils Absolute: 2.1 10*3/uL (ref 1.4–7.0)
Neutrophils: 52 %
Platelets: 230 10*3/uL (ref 150–450)
RBC: 4.62 x10E6/uL (ref 3.77–5.28)
RDW: 13.4 % (ref 11.7–15.4)
WBC: 4 10*3/uL (ref 3.4–10.8)

## 2019-05-17 LAB — COMPREHENSIVE METABOLIC PANEL
ALT: 16 IU/L (ref 0–32)
AST: 14 IU/L (ref 0–40)
Albumin/Globulin Ratio: 1.8 (ref 1.2–2.2)
Albumin: 4.8 g/dL (ref 3.8–4.8)
Alkaline Phosphatase: 54 IU/L (ref 39–117)
BUN/Creatinine Ratio: 19 (ref 12–28)
BUN: 20 mg/dL (ref 8–27)
Bilirubin Total: 0.5 mg/dL (ref 0.0–1.2)
CO2: 21 mmol/L (ref 20–29)
Calcium: 9.9 mg/dL (ref 8.7–10.3)
Chloride: 103 mmol/L (ref 96–106)
Creatinine, Ser: 1.03 mg/dL — ABNORMAL HIGH (ref 0.57–1.00)
GFR calc Af Amer: 65 mL/min/{1.73_m2} (ref >59)
GFR calc non Af Amer: 56 mL/min/{1.73_m2} — ABNORMAL LOW (ref >59)
Globulin, Total: 2.7 g/dL (ref 1.5–4.5)
Glucose: 122 mg/dL — ABNORMAL HIGH (ref 65–99)
Potassium: 4.2 mmol/L (ref 3.5–5.2)
Sodium: 141 mmol/L (ref 134–144)
Total Protein: 7.5 g/dL (ref 6.0–8.5)

## 2019-05-25 ENCOUNTER — Telehealth: Payer: Self-pay

## 2019-05-25 DIAGNOSIS — K209 Esophagitis, unspecified: Secondary | ICD-10-CM | POA: Diagnosis not present

## 2019-05-25 DIAGNOSIS — E164 Increased secretion of gastrin: Secondary | ICD-10-CM | POA: Diagnosis not present

## 2019-05-25 DIAGNOSIS — R7989 Other specified abnormal findings of blood chemistry: Secondary | ICD-10-CM

## 2019-05-25 NOTE — Telephone Encounter (Signed)
Patient was advised.  

## 2019-05-25 NOTE — Telephone Encounter (Signed)
Patient is requesting orders for labs. She would like to have them done on Monday 05/28/2019.

## 2019-05-25 NOTE — Telephone Encounter (Signed)
Lab ordered.

## 2019-05-28 ENCOUNTER — Other Ambulatory Visit: Payer: Self-pay | Admitting: Physician Assistant

## 2019-05-28 DIAGNOSIS — E7800 Pure hypercholesterolemia, unspecified: Secondary | ICD-10-CM

## 2019-05-28 DIAGNOSIS — R7989 Other specified abnormal findings of blood chemistry: Secondary | ICD-10-CM | POA: Diagnosis not present

## 2019-05-29 LAB — RENAL FUNCTION PANEL
Albumin: 4.3 g/dL (ref 3.8–4.8)
BUN/Creatinine Ratio: 16 (ref 12–28)
BUN: 15 mg/dL (ref 8–27)
CO2: 18 mmol/L — ABNORMAL LOW (ref 20–29)
Calcium: 9.5 mg/dL (ref 8.7–10.3)
Chloride: 106 mmol/L (ref 96–106)
Creatinine, Ser: 0.92 mg/dL (ref 0.57–1.00)
GFR calc Af Amer: 74 mL/min/{1.73_m2} (ref >59)
GFR calc non Af Amer: 64 mL/min/{1.73_m2} (ref >59)
Glucose: 117 mg/dL — ABNORMAL HIGH (ref 65–99)
Phosphorus: 2.8 mg/dL — ABNORMAL LOW (ref 3.0–4.3)
Potassium: 4.2 mmol/L (ref 3.5–5.2)
Sodium: 139 mmol/L (ref 134–144)

## 2019-05-30 ENCOUNTER — Telehealth: Payer: Self-pay

## 2019-05-30 NOTE — Telephone Encounter (Signed)
-----   Message from Mar Daring, [place] sent at 05/29/2019  5:06 PM EDT ----- Kidney function improved.

## 2019-05-30 NOTE — Telephone Encounter (Signed)
Patient was advised.  

## 2019-06-07 DIAGNOSIS — Z23 Encounter for immunization: Secondary | ICD-10-CM | POA: Diagnosis not present

## 2019-06-07 DIAGNOSIS — M47812 Spondylosis without myelopathy or radiculopathy, cervical region: Secondary | ICD-10-CM | POA: Diagnosis not present

## 2019-06-07 DIAGNOSIS — S0101XA Laceration without foreign body of scalp, initial encounter: Secondary | ICD-10-CM | POA: Diagnosis not present

## 2019-06-07 DIAGNOSIS — W228XXA Striking against or struck by other objects, initial encounter: Secondary | ICD-10-CM | POA: Diagnosis not present

## 2019-06-07 DIAGNOSIS — S098XXA Other specified injuries of head, initial encounter: Secondary | ICD-10-CM | POA: Diagnosis not present

## 2019-06-07 DIAGNOSIS — R110 Nausea: Secondary | ICD-10-CM | POA: Diagnosis not present

## 2019-06-07 DIAGNOSIS — S1989XA Other specified injuries of other specified part of neck, initial encounter: Secondary | ICD-10-CM | POA: Diagnosis not present

## 2019-06-07 DIAGNOSIS — S0990XA Unspecified injury of head, initial encounter: Secondary | ICD-10-CM | POA: Diagnosis not present

## 2019-06-07 DIAGNOSIS — S199XXA Unspecified injury of neck, initial encounter: Secondary | ICD-10-CM | POA: Diagnosis not present

## 2019-06-14 DIAGNOSIS — E785 Hyperlipidemia, unspecified: Secondary | ICD-10-CM | POA: Diagnosis not present

## 2019-06-14 DIAGNOSIS — W2209XD Striking against other stationary object, subsequent encounter: Secondary | ICD-10-CM | POA: Diagnosis not present

## 2019-06-14 DIAGNOSIS — S0101XD Laceration without foreign body of scalp, subsequent encounter: Secondary | ICD-10-CM | POA: Diagnosis not present

## 2019-09-27 DIAGNOSIS — H2513 Age-related nuclear cataract, bilateral: Secondary | ICD-10-CM | POA: Diagnosis not present

## 2019-09-27 LAB — HM DIABETES EYE EXAM

## 2019-10-02 ENCOUNTER — Encounter: Payer: Self-pay | Admitting: Physician Assistant

## 2019-10-03 ENCOUNTER — Ambulatory Visit (INDEPENDENT_AMBULATORY_CARE_PROVIDER_SITE_OTHER): Payer: PPO

## 2019-10-03 ENCOUNTER — Other Ambulatory Visit: Payer: Self-pay

## 2019-10-03 DIAGNOSIS — Z23 Encounter for immunization: Secondary | ICD-10-CM | POA: Diagnosis not present

## 2019-10-14 ENCOUNTER — Encounter: Payer: Self-pay | Admitting: Emergency Medicine

## 2019-10-14 ENCOUNTER — Other Ambulatory Visit: Payer: Self-pay

## 2019-10-14 ENCOUNTER — Emergency Department: Payer: PPO

## 2019-10-14 ENCOUNTER — Emergency Department
Admission: EM | Admit: 2019-10-14 | Discharge: 2019-10-14 | Disposition: A | Discharge status: Home / Self Care | Payer: PPO | Attending: Emergency Medicine | Admitting: Emergency Medicine

## 2019-10-14 DIAGNOSIS — R110 Nausea: Secondary | ICD-10-CM | POA: Diagnosis not present

## 2019-10-14 DIAGNOSIS — Z79899 Other long term (current) drug therapy: Secondary | ICD-10-CM | POA: Diagnosis not present

## 2019-10-14 DIAGNOSIS — R42 Dizziness and giddiness: Secondary | ICD-10-CM | POA: Diagnosis not present

## 2019-10-14 DIAGNOSIS — E119 Type 2 diabetes mellitus without complications: Secondary | ICD-10-CM | POA: Diagnosis not present

## 2019-10-14 LAB — COMPREHENSIVE METABOLIC PANEL
ALT: 19 U/L (ref 0–44)
AST: 18 U/L (ref 15–41)
Albumin: 4.3 g/dL (ref 3.5–5.0)
Alkaline Phosphatase: 54 U/L (ref 38–126)
Anion gap: 11 (ref 5–15)
BUN: 20 mg/dL (ref 8–23)
CO2: 25 mmol/L (ref 22–32)
Calcium: 9.7 mg/dL (ref 8.9–10.3)
Chloride: 105 mmol/L (ref 98–111)
Creatinine, Ser: 0.96 mg/dL (ref 0.44–1.00)
GFR calc Af Amer: >60 mL/min (ref >60)
GFR calc non Af Amer: >60 mL/min (ref >60)
Glucose, Bld: 143 mg/dL — ABNORMAL HIGH (ref 70–99)
Potassium: 4.4 mmol/L (ref 3.5–5.1)
Sodium: 141 mmol/L (ref 135–145)
Total Bilirubin: 0.7 mg/dL (ref 0.3–1.2)
Total Protein: 8.2 g/dL — ABNORMAL HIGH (ref 6.5–8.1)

## 2019-10-14 LAB — TROPONIN I (HIGH SENSITIVITY)
Troponin I (High Sensitivity): 7 ng/L (ref <18)
Troponin I (High Sensitivity): 4 ng/L (ref <18)

## 2019-10-14 LAB — CBC WITH DIFFERENTIAL/PLATELET
Abs Immature Granulocytes: 0.01 10*3/uL (ref 0.00–0.07)
Basophils Absolute: 0 10*3/uL (ref 0.0–0.1)
Basophils Relative: 1 %
Eosinophils Absolute: 0.1 10*3/uL (ref 0.0–0.5)
Eosinophils Relative: 1 %
HCT: 42.7 % (ref 36.0–46.0)
Hemoglobin: 13.9 g/dL (ref 12.0–15.0)
Immature Granulocytes: 0 %
Lymphocytes Relative: 26 %
Lymphs Abs: 1.1 10*3/uL (ref 0.7–4.0)
MCH: 28.7 pg (ref 26.0–34.0)
MCHC: 32.6 g/dL (ref 30.0–36.0)
MCV: 88.2 fL (ref 80.0–100.0)
Monocytes Absolute: 0.4 10*3/uL (ref 0.1–1.0)
Monocytes Relative: 9 %
Neutro Abs: 2.7 10*3/uL (ref 1.7–7.7)
Neutrophils Relative %: 63 %
Platelets: 241 10*3/uL (ref 150–400)
RBC: 4.84 MIL/uL (ref 3.87–5.11)
RDW: 13.6 % (ref 11.5–15.5)
WBC: 4.2 10*3/uL (ref 4.0–10.5)
nRBC: 0 % (ref 0.0–0.2)

## 2019-10-14 IMAGING — CT CT ANGIO NECK
2 of 11 series · 7 of 33 positions shown · IV contrast (APPLIED)
Comparison: None.

CLINICAL DATA: Vertigo episodic peripheral.  Nausea

EXAM:
CT ANGIOGRAPHY HEAD AND NECK
TECHNIQUE: Multidetector CT imaging of the head and neck was performed using
the standard protocol during bolus administration of intravenous
contrast. Multiplanar CT image reconstructions and MIPs were
obtained to evaluate the vascular anatomy. Carotid stenosis
measurements (when applicable) are obtained utilizing NASCET
criteria, using the distal internal carotid diameter as the
denominator.
CONTRAST:  75mL OMNIPAQUE IOHEXOL 350 MG/ML SOLN

[Series 9: cta head neck thins · axial · 0.39mm/px · z∈[-197,+5]mm · 5 of 607 slices shown]
[im 102/607  soft-tissue]
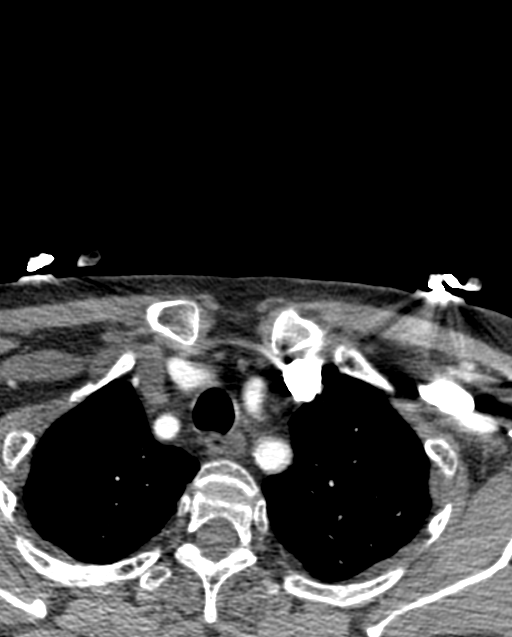
[im 203/607  bone]
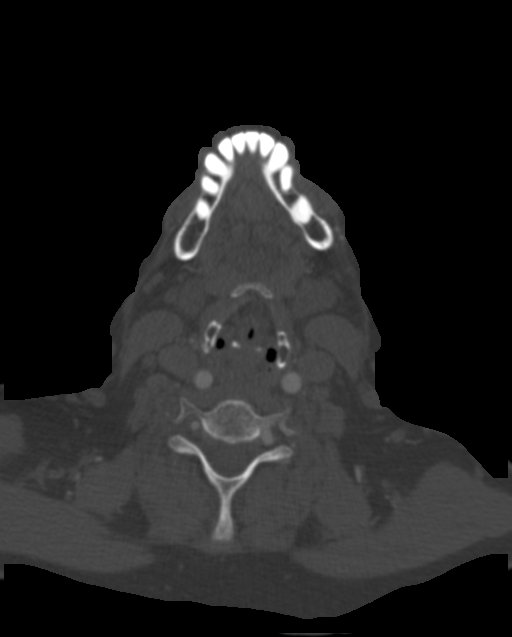
[im 304/607  soft-tissue]
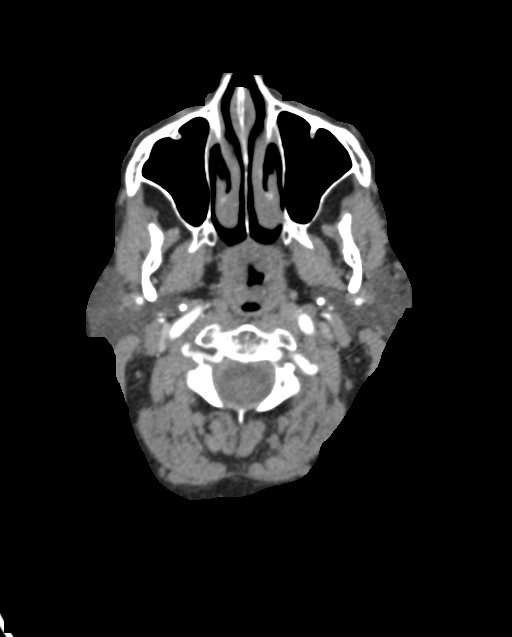
[im 405/607  bone]
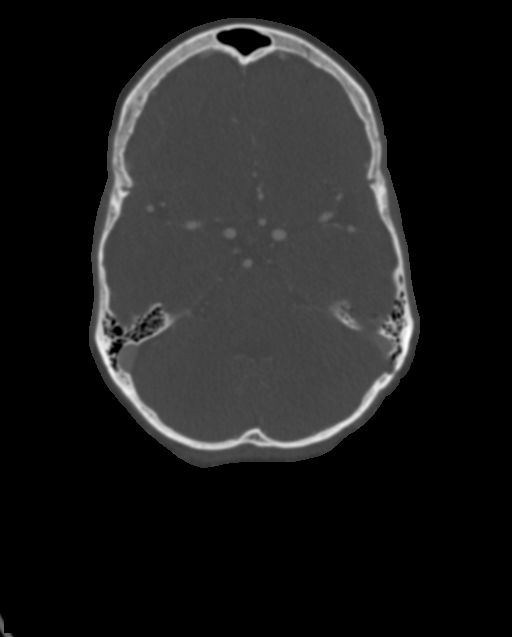
[im 506/607  soft-tissue]
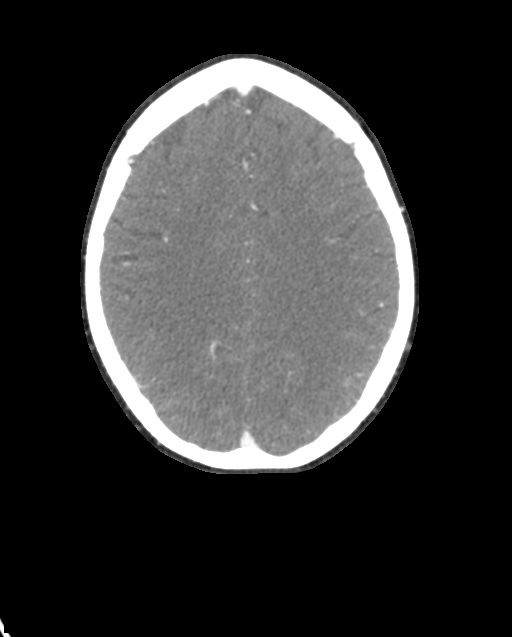

[Series 10: ax thin · axial · 0.39mm/px · z∈[-148,-48]mm · 2 of 300 slices shown]
[im 100/300  soft-tissue]
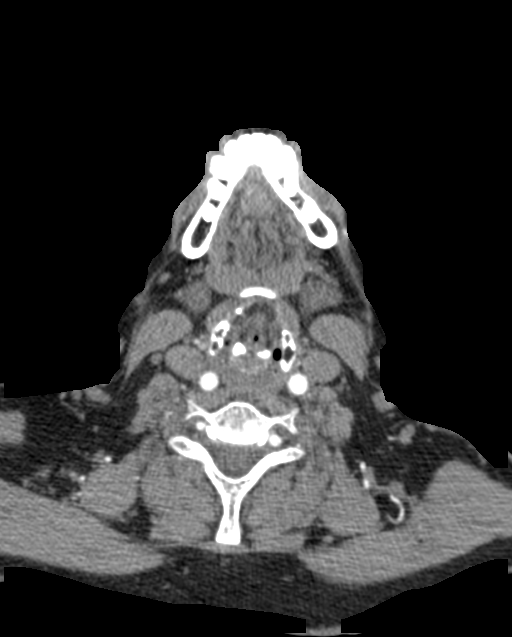
[im 200/300  soft-tissue]
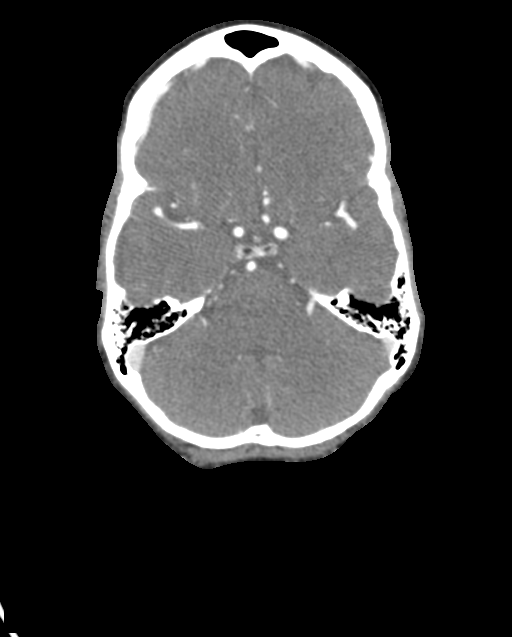

[7 of 33 positions shown; findings below may reference images not displayed]

FINDINGS: CT HEAD FINDINGS

Brain: No evidence of acute infarction, hemorrhage, hydrocephalus,
extra-axial collection or mass lesion/mass effect.

Vascular: Atherosclerotic calcification in the carotid and vertebral
arteries. Negative for hyperdense vessel

Skull: Negative

Sinuses: Negative

Orbits: Negative

Review of the MIP images confirms the above findings

CTA NECK FINDINGS

Aortic arch: Standard branching. Imaged portion shows no evidence of
aneurysm or dissection. No significant stenosis of the major arch
vessel origins. Atherosclerotic disease in the aortic arch and
proximal great vessels.

Right carotid system: Right carotid widely patent without stenosis.
Minimal atherosclerotic disease at the bifurcation

Left carotid system: Left carotid widely patent without stenosis.
Minimal atherosclerotic disease at the bifurcation

Vertebral arteries: Both vertebral arteries patent to the basilar.
Mild atherosclerotic stenosis distal vertebral artery bilaterally.

Skeleton: No acute skeletal abnormality.

Other neck: Hypertrophic tonsils bilaterally with associated
tonsilliths. Probable chronic inflammation. No adenopathy in the
neck. Subcentimeter thyroid nodules bilaterally.

Upper chest: Lung apices clear bilaterally.

Review of the MIP images confirms the above findings

CTA HEAD FINDINGS

Anterior circulation: Mild atherosclerotic calcification in the
cavernous carotid bilaterally without stenosis or aneurysm. Anterior
and middle cerebral arteries patent bilaterally without stenosis.

Posterior circulation: Left vertebral artery dominant. Mild stenosis
distal vertebral artery at the skull base bilaterally. PICA not
visualized however AICA is patent bilaterally. Basilar is widely
patent. Superior cerebellar and posterior cerebral arteries patent
bilaterally. Fetal origin right posterior cerebral artery.

Venous sinuses: Normal venous enhancement.

Anatomic variants: None

Review of the MIP images confirms the above findings
IMPRESSION: 1. No acute intracranial abnormality
2. Negative for intracranial large vessel occlusion or significant
stenosis
3. No significant carotid or vertebral artery stenosis in the neck.
4. Enlarged tonsils bilaterally likely due to chronic inflammation.

## 2019-10-14 MED ORDER — ONDANSETRON HCL 4 MG PO TABS: 4.0000 mg | ORAL_TABLET | Freq: Every day | ORAL | 0 refills | Status: DC | PRN

## 2019-10-14 MED ORDER — DIPHENHYDRAMINE HCL 50 MG/ML IJ SOLN
12.5000 mg | Freq: Once | INTRAMUSCULAR | Status: AC
  Administered 2019-10-14: 12.5 mg via INTRAVENOUS
  Filled 2019-10-14: qty 1

## 2019-10-14 MED ORDER — IOHEXOL 350 MG/ML SOLN
75.0000 mL | Freq: Once | INTRAVENOUS | Status: AC | PRN
  Administered 2019-10-14: 11:00:00 75 mL via INTRAVENOUS

## 2019-10-14 MED ORDER — ONDANSETRON HCL 4 MG/2ML IJ SOLN
4.0000 mg | Freq: Once | INTRAMUSCULAR | Status: AC
  Administered 2019-10-14: 4 mg via INTRAVENOUS
  Filled 2019-10-14: qty 2

## 2019-10-14 MED ORDER — SODIUM CHLORIDE 0.9 % IV BOLUS
1000.0000 mL | Freq: Once | INTRAVENOUS | Status: AC
  Administered 2019-10-14: 10:00:00 1000 mL via INTRAVENOUS

## 2019-10-14 MED ORDER — METOCLOPRAMIDE HCL 5 MG/ML IJ SOLN
5.0000 mg | Freq: Once | INTRAMUSCULAR | Status: AC
  Administered 2019-10-14: 5 mg via INTRAVENOUS
  Filled 2019-10-14: qty 2

## 2019-10-14 MED ORDER — MECLIZINE HCL 25 MG PO TABS
25.0000 mg | ORAL_TABLET | Freq: Once | ORAL | Status: AC
  Administered 2019-10-14: 25 mg via ORAL
  Filled 2019-10-14: qty 1

## 2019-10-14 MED ORDER — MECLIZINE HCL 25 MG PO TABS: 25.0000 mg | ORAL_TABLET | Freq: Three times a day (TID) | ORAL | 0 refills | Status: DC | PRN

## 2019-10-14 NOTE — ED Notes (Signed)
Patient transported to CT 

## 2019-10-14 NOTE — ED Notes (Signed)
Pt returned from CT °

## 2019-10-14 NOTE — Discharge Instructions (Addendum)
Your work-up was reassuring with negative CT scan, labs.  I am going to prescribe you meclizine to help with the dizziness.  If you start to have nausea you can use the Zofran.  You should return to the ER for worsening symptoms, vomiting, on ability to walk or any other concerns.

## 2019-10-14 NOTE — ED Triage Notes (Signed)
Pt to ED via POV c/o dizziness and nausea since waking up this morning. Pt reports that symptoms started at the same time. Pt denies vomiting. Pt states that she feels like the room is spinning and feels like she is going to fall. Pt states that she has never been diagnosed with vertigo but she has had some episodes of what she believes was vertigo. Pt denies numbness and weakness on one side of her body. No slurred speech is noted. No facial droop noted at this time.

## 2019-10-14 NOTE — ED Triage Notes (Signed)
FIRST NURSE NOTE:  Pt arrived via POV with son. Pt reports dizziness since this morning, reports nausea with the dizziness as well. Denies any other sxs at this time.

## 2019-10-14 NOTE — ED Notes (Addendum)
Pt reports sudden onset of dizziness and nausea at 8am this morning, she states that last night she had a random sudden pain behind her right knee last night that she doesn't believe to be related.  She says it feels like the room is spinning when she moves and is nauseous when she is still. When she moves she states that if she moves she feels like she is going to vomit and pass out.  Denies history of the same. No headache present, strength equal bilaterally, no slurred speech or facial differences. No recent sickness.

## 2019-10-14 NOTE — ED Provider Notes (Signed)
[facility] Emergency Department Provider Note  ____________________________________________   First MD Initiated Contact with Patient 10/14/19 [phone]     (approximate)  I have reviewed the triage vital signs and the nursing notes.   HISTORY  Chief Complaint Dizziness and Nausea    HPI Holly Matthews is a 68 y.o. female with diabetes who presents with dizziness that started this morning.  Patient woke up around 8 AM and felt normal.  However the when she woke up again she had severe dizziness that was intermittent, worse with moving her head or any positional changes, better with rest.  She denies any chest pain, shortness of breath, fevers.  She thinks she is had some episodes of vertigo in the past but none for years.  No ringing of ears.  No excessive aspirin use.  No recent medication changes.          Past Medical History:  Diagnosis Date   Allergy    Diabetes mellitus without complication ([place])    Diet controlled   GERD (gastroesophageal reflux disease)    Hyperlipidemia    Serum cholesterol elevated     Patient Active Problem List   Diagnosis Date Noted   Allergic rhinitis 05/06/2015   Acid reflux 05/06/2015   History of colon polyps 05/06/2015   H/O renal calculi 05/06/2015   Hypercholesteremia 05/06/2015   Irregular cardiac rhythm 05/06/2015   Neutropenia ([place]) 05/06/2015   Diabetes mellitus, type 2 ([place]) 05/06/2015   Adenomatous polyp 05/06/2015    Past Surgical History:  Procedure Laterality Date   [place]   COLONOSCOPY     COLONOSCOPY WITH PROPOFOL N/A 11/06/2018   Procedure: COLONOSCOPY WITH PROPOFOL;  Surgeon: Lollie Sails, MD;  Location: [facility] ENDOSCOPY;  Service: Endoscopy;  Laterality: N/A;   ESOPHAGOGASTRODUODENOSCOPY     ESOPHAGOGASTRODUODENOSCOPY (EGD) WITH PROPOFOL N/A 11/06/2018   Procedure: ESOPHAGOGASTRODUODENOSCOPY (EGD) WITH PROPOFOL;  Surgeon:  Lollie Sails, MD;  Location: [facility] ENDOSCOPY;  Service: Endoscopy;  Laterality: N/A;    Prior to Admission medications   Medication Sig Start Date End Date Taking? Authorizing Provider  atorvastatin (LIPITOR) 10 MG tablet TAKE 1 TABLET(10 MG) BY MOUTH DAILY 05/28/19  Yes Burnette, Anderson Malta M, PA-C  calcium-vitamin D (OSCAL WITH D) 500-200 MG-UNIT TABS tablet Take 2 tablets by mouth daily.    Yes [provider]  pantoprazole (PROTONIX) 40 MG tablet Take 40 mg by mouth 2 (two) times daily. 03/26/19  Yes [provider]    Allergies Codeine  Family History  Problem Relation Age of Onset   Diabetes Son    Hypertension Father    CVA Father    Healthy Sister    Healthy Brother    Colon cancer Maternal Aunt    Healthy Sister     Social History Social History   Tobacco Use   Smoking status: Never Smoker   Smokeless tobacco: Never Used  Substance Use Topics   Alcohol use: No   Drug use: No      Review of Systems Constitutional: No fever/chills Eyes: No visual changes. ENT: No sore throat. Cardiovascular: Denies chest pain. Respiratory: Denies shortness of breath. Gastrointestinal: No abdominal pain.  No nausea, no vomiting.  No diarrhea.  No constipation. Genitourinary: Negative for dysuria. Musculoskeletal: Negative for back pain. Skin: Negative for rash. Neurological: Negative for headaches, focal weakness or numbness.  Positive dizziness All other ROS negative ____________________________________________   PHYSICAL EXAM:  VITAL SIGNS:  ED Triage Vitals  Enc Vitals Group     BP 10/14/19 0909 98/74     Pulse Rate 10/14/19 0908 80     Resp 10/14/19 0908 16     Temp 10/14/19 0908 98.1 F (36.7 C)     Temp Source 10/14/19 0908 Oral     SpO2 10/14/19 0908 96 %     Weight 10/14/19 0909 128 lb (58.1 kg)     Height 10/14/19 0909 5\' 1"  (1.549 m)     Head Circumference --      Peak Flow --      Pain Score 10/14/19 0909 0     Pain Loc  --      Pain Edu? --      Excl. in [place]? --     Constitutional: Alert and oriented. Well appearing and in no acute distress. Eyes: Conjunctivae are normal. EOMI. No nystagmus. Equal and reactive.  Head: Atraumatic. Nose: No congestion/rhinnorhea. Mouth/Throat: Mucous membranes are moist.   Neck: No stridor. Trachea Midline. FROM Cardiovascular: Normal rate, regular rhythm. Grossly normal heart sounds.  Good peripheral circulation. Respiratory: Normal respiratory effort.  No retractions. Lungs CTAB. Gastrointestinal: Soft and nontender. No distention. No abdominal bruits.  Musculoskeletal: No lower extremity tenderness nor edema.  No joint effusions. Neurologic:  Normal speech and language. No gross focal neurologic deficits are appreciated.  Cranial nerves II through XII are intact.  Equal arm and leg strength.  No pronator drift.  Finger-to-nose intact.  Heel-to-shin intact. Skin:  Skin is warm, dry and intact. No rash noted. Psychiatric: Mood and affect are normal. Speech and behavior are normal. GU: Deferred   ____________________________________________   LABS (all labs ordered are listed, but only abnormal results are displayed)  Labs Reviewed  COMPREHENSIVE METABOLIC PANEL - Abnormal; Notable for the following components:      Result Value   Glucose, Bld 143 (*)    Total Protein 8.2 (*)    All other components within normal limits  CBC WITH DIFFERENTIAL/PLATELET  TROPONIN I (HIGH SENSITIVITY)  TROPONIN I (HIGH SENSITIVITY)   ____________________________________________   ED ECG REPORT I, Vanessa Wilkinson, the attending physician, personally viewed and interpreted this ECG.  EKG is normal sinus rate of 74, no ST elevation, T wave inversion in V2, right bundle branch block, EKG looks similar to prior ____________________________________________  RADIOLOGY  Official radiology report(s): Ct Angio Head W Or Wo Contrast  Result Date: 10/14/2019 CLINICAL DATA:  Vertigo  episodic peripheral.  Nausea EXAM: CT ANGIOGRAPHY HEAD AND NECK TECHNIQUE: Multidetector CT imaging of the head and neck was performed using the standard protocol during bolus administration of intravenous contrast. Multiplanar CT image reconstructions and MIPs were obtained to evaluate the vascular anatomy. Carotid stenosis measurements (when applicable) are obtained utilizing NASCET criteria, using the distal internal carotid diameter as the denominator. CONTRAST:  74mL OMNIPAQUE IOHEXOL 350 MG/ML SOLN COMPARISON:  None. FINDINGS: CT HEAD FINDINGS Brain: No evidence of acute infarction, hemorrhage, hydrocephalus, extra-axial collection or mass lesion/mass effect. Vascular: Atherosclerotic calcification in the carotid and vertebral arteries. Negative for hyperdense vessel Skull: Negative Sinuses: Negative Orbits: Negative Review of the MIP images confirms the above findings CTA NECK FINDINGS Aortic arch: Standard branching. Imaged portion shows no evidence of aneurysm or dissection. No significant stenosis of the major arch vessel origins. Atherosclerotic disease in the aortic arch and proximal great vessels. Right carotid system: Right carotid widely patent without stenosis. Minimal atherosclerotic disease at the bifurcation Left carotid system: Left carotid widely  patent without stenosis. Minimal atherosclerotic disease at the bifurcation Vertebral arteries: Both vertebral arteries patent to the basilar. Mild atherosclerotic stenosis distal vertebral artery bilaterally. Skeleton: No acute skeletal abnormality. Other neck: Hypertrophic tonsils bilaterally with associated tonsilliths. Probable chronic inflammation. No adenopathy in the neck. Subcentimeter thyroid nodules bilaterally. Upper chest: Lung apices clear bilaterally. Review of the MIP images confirms the above findings CTA HEAD FINDINGS Anterior circulation: Mild atherosclerotic calcification in the cavernous carotid bilaterally without stenosis or  aneurysm. Anterior and middle cerebral arteries patent bilaterally without stenosis. Posterior circulation: Left vertebral artery dominant. Mild stenosis distal vertebral artery at the skull base bilaterally. PICA not visualized however AICA is patent bilaterally. Basilar is widely patent. Superior cerebellar and posterior cerebral arteries patent bilaterally. Fetal origin right posterior cerebral artery. Venous sinuses: Normal venous enhancement. Anatomic variants: None Review of the MIP images confirms the above findings IMPRESSION: 1. No acute intracranial abnormality 2. Negative for intracranial large vessel occlusion or significant stenosis 3. No significant carotid or vertebral artery stenosis in the neck. 4. Enlarged tonsils bilaterally likely due to chronic inflammation. Electronically Signed   By: Franchot Gallo M.D.   On: 10/14/2019 11:26   Ct Angio Neck W And/or Wo Contrast  Result Date: 10/14/2019 CLINICAL DATA:  Vertigo episodic peripheral.  Nausea EXAM: CT ANGIOGRAPHY HEAD AND NECK TECHNIQUE: Multidetector CT imaging of the head and neck was performed using the standard protocol during bolus administration of intravenous contrast. Multiplanar CT image reconstructions and MIPs were obtained to evaluate the vascular anatomy. Carotid stenosis measurements (when applicable) are obtained utilizing NASCET criteria, using the distal internal carotid diameter as the denominator. CONTRAST:  33mL OMNIPAQUE IOHEXOL 350 MG/ML SOLN COMPARISON:  None. FINDINGS: CT HEAD FINDINGS Brain: No evidence of acute infarction, hemorrhage, hydrocephalus, extra-axial collection or mass lesion/mass effect. Vascular: Atherosclerotic calcification in the carotid and vertebral arteries. Negative for hyperdense vessel Skull: Negative Sinuses: Negative Orbits: Negative Review of the MIP images confirms the above findings CTA NECK FINDINGS Aortic arch: Standard branching. Imaged portion shows no evidence of aneurysm or  dissection. No significant stenosis of the major arch vessel origins. Atherosclerotic disease in the aortic arch and proximal great vessels. Right carotid system: Right carotid widely patent without stenosis. Minimal atherosclerotic disease at the bifurcation Left carotid system: Left carotid widely patent without stenosis. Minimal atherosclerotic disease at the bifurcation Vertebral arteries: Both vertebral arteries patent to the basilar. Mild atherosclerotic stenosis distal vertebral artery bilaterally. Skeleton: No acute skeletal abnormality. Other neck: Hypertrophic tonsils bilaterally with associated tonsilliths. Probable chronic inflammation. No adenopathy in the neck. Subcentimeter thyroid nodules bilaterally. Upper chest: Lung apices clear bilaterally. Review of the MIP images confirms the above findings CTA HEAD FINDINGS Anterior circulation: Mild atherosclerotic calcification in the cavernous carotid bilaterally without stenosis or aneurysm. Anterior and middle cerebral arteries patent bilaterally without stenosis. Posterior circulation: Left vertebral artery dominant. Mild stenosis distal vertebral artery at the skull base bilaterally. PICA not visualized however AICA is patent bilaterally. Basilar is widely patent. Superior cerebellar and posterior cerebral arteries patent bilaterally. Fetal origin right posterior cerebral artery. Venous sinuses: Normal venous enhancement. Anatomic variants: None Review of the MIP images confirms the above findings IMPRESSION: 1. No acute intracranial abnormality 2. Negative for intracranial large vessel occlusion or significant stenosis 3. No significant carotid or vertebral artery stenosis in the neck. 4. Enlarged tonsils bilaterally likely due to chronic inflammation. Electronically Signed   By: Franchot Gallo M.D.   On: 10/14/2019 11:26    ____________________________________________  PROCEDURES  Procedure(s) performed (including Critical  Care):  Procedures   ____________________________________________   INITIAL IMPRESSION / ASSESSMENT AND PLAN / ED COURSE  Holly Matthews was evaluated in Emergency Department on 10/14/2019 for the symptoms described in the history of present illness. She was evaluated in the context of the global COVID-19 pandemic, which necessitated consideration that the patient might be at risk for infection with the SARS-CoV-2 virus that causes COVID-19. Institutional protocols and algorithms that pertain to the evaluation of patients at risk for COVID-19 are in a state of rapid change based on information released by regulatory bodies including the CDC and federal and state organizations. These policies and algorithms were followed during the patient's care in the ED.     Patient is a 68 year old who presents with dizziness with position changes.  It was sudden in onset this morning and associated with some nausea but no vomiting.  This seems more likely peripheral vertigo given the above.  I had a lengthy discussion with patient about her being within the window for TPA however the risk outweigh benefits given this is more likely secondary peripheral vertical and patient wanted to hold off.  We discussed MRI to rule out stroke but patient does not do well with MRIs and would like to hold off on that.  Given my suspicion that this is more likely peripheral vertigo that is reasonable.  We will get CT scans just to ensure that the vasculature looks okay and give patient medications to treat her vertigo.  Will get labs to evaluate for electrolyte abnormalities, AKI, ACS although my suspicion is less likely.  TMs are clear no evidence of impacted cerumen versus infection.  Labs are reassuring.  Patient still feels a little bit of dizziness the nausea has now resolved.  We will give some IV Benadryl and IV reglan.   CT scans negative.    12:26 PM reevaluated patient and patient's dizziness is much better.  She  says she feels a little woozy from the medications but otherwise she feels better.  We discussed the CT results and my low suspicion for stroke.  We discussed MRI but at this time we will hold off given symptoms are better, most likely peripheral and CT scans are negative.  Patient is requesting to go home at this time.  We will give a course of meclizine.  We discussed return precautions in relation to stroke.  Patient was able to stand up and ambulate well without evidence of ataxia.  I discussed the provisional nature of ED diagnosis, the treatment so far, the ongoing plan of care, follow up appointments and return precautions with the patient and any family or support people present. They expressed understanding and agreed with the plan, discharged home.    ____________________________________________   FINAL CLINICAL IMPRESSION(S) / ED DIAGNOSES   Final diagnoses:  Vertigo      MEDICATIONS GIVEN DURING THIS VISIT:  Medications  sodium chloride 0.9 % bolus 1,000 mL (1,000 mLs Intravenous New Bag/Given 10/14/19 1021)  ondansetron (ZOFRAN) injection 4 mg (4 mg Intravenous Given 10/14/19 1020)  meclizine (ANTIVERT) tablet 25 mg (25 mg Oral Given 10/14/19 1019)  iohexol (OMNIPAQUE) 350 MG/ML injection 75 mL (75 mLs Intravenous Contrast Given 10/14/19 1051)  metoCLOPramide (REGLAN) injection 5 mg (5 mg Intravenous Given 10/14/19 1140)  diphenhydrAMINE (BENADRYL) injection 12.5 mg (12.5 mg Intravenous Given 10/14/19 1139)     ED Discharge Orders         Ordered  meclizine (ANTIVERT) 25 MG tablet  3 times daily PRN     10/14/19 1228    ondansetron (ZOFRAN) 4 MG tablet  Daily PRN     10/14/19 1228           Note:  This document was prepared using Dragon voice recognition software and may include unintentional dictation errors.   Vanessa Davidson, MD 10/14/19 1229

## 2019-12-03 ENCOUNTER — Other Ambulatory Visit: Payer: Self-pay | Admitting: Physician Assistant

## 2019-12-03 DIAGNOSIS — E7800 Pure hypercholesterolemia, unspecified: Secondary | ICD-10-CM

## 2019-12-10 NOTE — Progress Notes (Signed)
Patient: Holly Matthews, Female    DOB: [birth year], 68 y.o.   MRN: [id number] Visit Date: 12/17/2019  Today's Provider: Mar Daring, PA-C   Chief Complaint  Patient presents with  . Annual Exam   Subjective:     Annual wellness visit Holly Matthews is a 68 y.o. female. She feels well. She reports exercising not like she should. She reports she is sleeping well. 12/06/2018 CPE ----------------------------------------------------------- 11/06/2018 Colonoscopy-polyps, diverticulosis, external and internal hemorrhoids.  03/29/2017 - Mammogram - Bi - Rads 1   Review of Systems  Constitutional: Negative.   HENT: Negative.   Eyes: Negative.   Respiratory: Negative.   Cardiovascular: Negative.   Gastrointestinal: Negative.   Endocrine: Negative.   Genitourinary: Negative.   Musculoskeletal: Negative.   Skin: Negative.   Allergic/Immunologic: Negative.   Neurological: Positive for dizziness.  Hematological: Negative.   Psychiatric/Behavioral: Negative.     Social History   Socioeconomic History  . Marital status: Widowed    Spouse name: Not on file  . Number of children: 2  . Years of education: College  . Highest education level: Not on file  Occupational History    Comment: Part-Time    Employer: [employer]  Tobacco Use  . Smoking status: Never Smoker  . Smokeless tobacco: Never Used  Substance and Sexual Activity  . Alcohol use: No  . Drug use: No  . Sexual activity: Not Currently  Other Topics Concern  . Not on file  Social History Narrative  . Not on file   Social Determinants of Health   Financial Resource Strain:   . Difficulty of Paying Living Expenses: Not on file  Food Insecurity:   . Worried About Charity fundraiser in the Last Year: Not on file  . Ran Out of Food in the Last Year: Not on file  Transportation Needs:   . Lack of Transportation (Medical): Not on file  . Lack of Transportation (Non-Medical): Not on file   Physical Activity:   . Days of Exercise per Week: Not on file  . Minutes of Exercise per Session: Not on file  Stress:   . Feeling of Stress : Not on file  Social Connections:   . Frequency of Communication with Friends and Family: Not on file  . Frequency of Social Gatherings with Friends and Family: Not on file  . Attends Religious Services: Not on file  . Active Member of Clubs or Organizations: Not on file  . Attends Archivist Meetings: Not on file  . Marital Status: Not on file  Intimate Partner Violence:   . Fear of Current or Ex-Partner: Not on file  . Emotionally Abused: Not on file  . Physically Abused: Not on file  . Sexually Abused: Not on file    Past Medical History:  Diagnosis Date  . Allergy   . Diabetes mellitus without complication ([place])    Diet controlled  . GERD (gastroesophageal reflux disease)   . Hyperlipidemia   . Serum cholesterol elevated      Patient Active Problem List   Diagnosis Date Noted  . Near syncope 10/11/2018  . Heart palpitations 10/11/2018  . Dyspepsia 09/05/2018  . Allergic rhinitis 05/06/2015  . Acid reflux 05/06/2015  . History of colon polyps 05/06/2015  . H/O renal calculi 05/06/2015  . Hypercholesteremia 05/06/2015  . Irregular cardiac rhythm 05/06/2015  . Neutropenia ([place]) 05/06/2015  . Diabetes mellitus, type 2 ([place]) 05/06/2015  . Adenomatous  polyp 05/06/2015    Past Surgical History:  Procedure Laterality Date  . ABDOMINAL HYSTERECTOMY  1980  . APPENDECTOMY  1967  . COLONOSCOPY    . COLONOSCOPY WITH PROPOFOL N/A 11/06/2018   Procedure: COLONOSCOPY WITH PROPOFOL;  Surgeon: Lollie Sails, MD;  Location: [facility] ENDOSCOPY;  Service: Endoscopy;  Laterality: N/A;  . ESOPHAGOGASTRODUODENOSCOPY    . ESOPHAGOGASTRODUODENOSCOPY (EGD) WITH PROPOFOL N/A 11/06/2018   Procedure: ESOPHAGOGASTRODUODENOSCOPY (EGD) WITH PROPOFOL;  Surgeon: Lollie Sails, MD;  Location: [facility] ENDOSCOPY;  Service: Endoscopy;   Laterality: N/A;    Her family history includes CVA in her father; Colon cancer in her maternal aunt; Diabetes in her son; Healthy in her brother, sister, and sister; Hypertension in her father.   Current Outpatient Medications:  .  atorvastatin (LIPITOR) 10 MG tablet, TAKE 1 TABLET(10 MG) BY MOUTH DAILY, Disp: 90 tablet, Rfl: 0 .  calcium-vitamin D (OSCAL WITH D) 500-200 MG-UNIT TABS tablet, Take 2 tablets by mouth daily. , Disp: , Rfl:  .  cephALEXin (KEFLEX) 500 MG capsule, Take 1 capsule (500 mg total) by mouth 2 (two) times daily., Disp: 14 capsule, Rfl: 0 .  meclizine (ANTIVERT) 25 MG tablet, Take 1 tablet (25 mg total) by mouth 3 (three) times daily as needed for dizziness., Disp: 30 tablet, Rfl: 0 .  ondansetron (ZOFRAN) 4 MG tablet, Take 1 tablet (4 mg total) by mouth daily as needed for nausea or vomiting., Disp: 15 tablet, Rfl: 0 .  pantoprazole (PROTONIX) 40 MG tablet, Take 40 mg by mouth 2 (two) times daily., Disp: , Rfl:   Patient Care Team: Mar Daring, PA-C as PCP - General (Family Medicine)    Objective:    Vitals: BP 113/61   Pulse 82   Temp (!) 97.1 F (36.2 C) (Temporal)   Resp 18   Ht 5\' 5"  (1.651 m)   Wt 128 lb (58.1 kg)   BMI 21.30 kg/m   Physical Exam  Activities of Daily Living In your present state of health, do you have any difficulty performing the following activities: 12/12/2019  Hearing? N  Vision? N  Difficulty concentrating or making decisions? N  Walking or climbing stairs? N  Dressing or bathing? N  Doing errands, shopping? N  Some recent data might be hidden    Fall Risk Assessment Fall Risk  12/12/2019 12/06/2018 11/25/2017 11/24/2016  Falls in the past year? 0 0 No No  Number falls in past yr: 0 - - -  Injury with Fall? 0 - - -  Follow up Falls evaluation completed - - -     Depression Screen PHQ 2/9 Scores 12/12/2019 12/06/2018 11/25/2017 11/24/2016  PHQ - 2 Score 0 0 0 0  PHQ- 9 Score 1 0 - -    No flowsheet data  found.    Assessment & Plan:     Annual Wellness Visit  Reviewed patient's Family Medical History Reviewed and updated list of patient's medical providers Assessment of cognitive impairment was done Assessed patient's functional ability Established a written schedule for health screening [place] Completed and Reviewed  Exercise Activities and Dietary recommendations Goals    . Exercise 3x per week (30 min per time)     Recommend to start back exercising for 3 days a week for 30-45 minutes.        Immunization History  Administered Date(s) Administered  . Influenza Split 10/25/2011  . Influenza, High Dose Seasonal PF 11/24/2016, 11/25/2017  . Influenza,inj,Quad PF,6+ Mos  12/06/2018, 10/03/2019  . Pneumococcal Conjugate-13 11/24/2016  . Pneumococcal Polysaccharide-23 05/16/2018    Health Maintenance  Topic Date Due  . Samul Dada  02/16/1970  . MAMMOGRAM  03/30/2019  . FOOT EXAM  12/07/2019  . HEMOGLOBIN A1C  06/11/2020  . OPHTHALMOLOGY EXAM  09/26/2020  . URINE MICROALBUMIN  12/11/2020  . COLONOSCOPY  11/07/2023  . INFLUENZA VACCINE  Completed  . DEXA SCAN  Completed  . Hepatitis C Screening  Completed  . PNA vac Low Risk Adult  Completed     Discussed health benefits of physical activity, and encouraged her to engage in regular exercise appropriate for her age and condition.    1. Annual physical exam Normal physical exam today. Will check labs as below and f/u pending lab results. If labs are stable and WNL she will not need to have these rechecked for one year at her next annual physical exam. She is to call the office in the meantime if she has any acute issue, questions or concerns. - CBC w/Diff/Platelet - Comprehensive Metabolic Panel (CMET) - Lipid Profile - HgB A1c  2. Frequent urination Worsening symptoms. UA positive. Will treat empirically with Keflex. Continue to push fluids. Urine sent for culture. Will follow up pending  C&S results. She is to call if symptoms do not improve or if they worsen.  - cephALEXin (KEFLEX) 500 MG capsule; Take 1 capsule (500 mg total) by mouth 2 (two) times daily.  Dispense: 14 capsule; Refill: 0  3. Encounter for breast cancer screening using non-mammogram modality Breast exam today was normal. There is no family history of breast cancer. She does perform regular self breast exams. Mammogram was ordered as below. Information for [facility] Breast clinic was given to patient so she may schedule her mammogram at her convenience. - MM 3D SCREEN BREAST BILATERAL; Future  4. Type 2 diabetes mellitus without complication, without long-term current use of insulin ([facility]) Diet controlled. Will check labs as below and f/u pending results. - CBC w/Diff/Platelet - Comprehensive Metabolic Panel (CMET) - Lipid Profile - HgB A1c  5. Hypercholesteremia Continue Atorvastatin 10mg . Will check labs as below and f/u pending results. - CBC w/Diff/Platelet - Comprehensive Metabolic Panel (CMET) - Lipid Profile - HgB A1c  6. Suspected UTI See above medical treatment plan for #2.  - Urine Culture - cephALEXin (KEFLEX) 500 MG capsule; Take 1 capsule (500 mg total) by mouth 2 (two) times daily.  Dispense: 14 capsule; Refill: 0  7. Gallbladder polyp H/O this and not imaged x 1 year. Will get [place] to confirm unchanged.  - US Abdomen Limited RUQ; Future  ------------------------------------------------------------------------------------------------------------    Mar Daring, PA-C  [facility]

## 2019-12-12 ENCOUNTER — Other Ambulatory Visit: Payer: Self-pay

## 2019-12-12 ENCOUNTER — Encounter: Payer: Self-pay | Admitting: Physician Assistant

## 2019-12-12 ENCOUNTER — Ambulatory Visit (INDEPENDENT_AMBULATORY_CARE_PROVIDER_SITE_OTHER): Payer: PPO | Admitting: Physician Assistant

## 2019-12-12 VITALS — BP 113/61 | HR 82 | Temp 97.1°F | Resp 18 | Ht 65.0 in | Wt 128.0 lb

## 2019-12-12 DIAGNOSIS — E7800 Pure hypercholesterolemia, unspecified: Secondary | ICD-10-CM

## 2019-12-12 DIAGNOSIS — R350 Frequency of micturition: Secondary | ICD-10-CM

## 2019-12-12 DIAGNOSIS — R3989 Other symptoms and signs involving the genitourinary system: Secondary | ICD-10-CM

## 2019-12-12 DIAGNOSIS — K824 Cholesterolosis of gallbladder: Secondary | ICD-10-CM

## 2019-12-12 DIAGNOSIS — E119 Type 2 diabetes mellitus without complications: Secondary | ICD-10-CM

## 2019-12-12 DIAGNOSIS — Z1239 Encounter for other screening for malignant neoplasm of breast: Secondary | ICD-10-CM

## 2019-12-12 DIAGNOSIS — Z0000 Encounter for general adult medical examination without abnormal findings: Secondary | ICD-10-CM | POA: Diagnosis not present

## 2019-12-12 LAB — POCT URINALYSIS DIPSTICK
Glucose, UA: NEGATIVE
Ketones, UA: NEGATIVE
Nitrite, UA: NEGATIVE
Protein, UA: POSITIVE — AB
Spec Grav, UA: >=1.03 — AB (ref 1.010–1.025)
Urobilinogen, UA: 0.2 E.U./dL
pH, UA: 6 (ref 5.0–8.0)

## 2019-12-12 LAB — POCT UA - MICROALBUMIN: Microalbumin Ur, POC: 20 mg/L

## 2019-12-12 MED ORDER — CEPHALEXIN 500 MG PO CAPS: 500.0000 mg | ORAL_CAPSULE | Freq: Two times a day (BID) | ORAL | 0 refills | Status: DC

## 2019-12-12 NOTE — Patient Instructions (Signed)
Health Maintenance After Age 68 After age 68, you are at a higher risk for certain long-term diseases and infections as well as injuries from falls. Falls are a major cause of broken bones and head injuries in people who are older than age 68. Getting regular preventive care can help to keep you healthy and well. Preventive care includes getting regular testing and making lifestyle changes as recommended by your health care provider. Talk with your health care provider about:  Which screenings and tests you should have. A screening is a test that checks for a disease when you have no symptoms.  A diet and exercise plan that is right for you. What should I know about screenings and tests to prevent falls? Screening and testing are the best ways to find a health problem early. Early diagnosis and treatment give you the best chance of managing medical conditions that are common after age 68. Certain conditions and lifestyle choices may make you more likely to have a fall. Your health care provider may recommend:  Regular vision checks. Poor vision and conditions such as cataracts can make you more likely to have a fall. If you wear glasses, make sure to get your prescription updated if your vision changes.  Medicine review. Work with your health care provider to regularly review all of the medicines you are taking, including over-the-counter medicines. Ask your health care provider about any side effects that may make you more likely to have a fall. Tell your health care provider if any medicines that you take make you feel dizzy or sleepy.  Osteoporosis screening. Osteoporosis is a condition that causes the bones to get weaker. This can make the bones weak and cause them to break more easily.  Blood pressure screening. Blood pressure changes and medicines to control blood pressure can make you feel dizzy.  Strength and balance checks. Your health care provider may recommend certain tests to check your  strength and balance while standing, walking, or changing positions.  Foot health exam. Foot pain and numbness, as well as not wearing proper footwear, can make you more likely to have a fall.  Depression screening. You may be more likely to have a fall if you have a fear of falling, feel emotionally low, or feel unable to do activities that you used to do.  Alcohol use screening. Using too much alcohol can affect your balance and may make you more likely to have a fall. What actions can I take to lower my risk of falls? General instructions  Talk with your health care provider about your risks for falling. Tell your health care provider if: ? You fall. Be sure to tell your health care provider about all falls, even ones that seem minor. ? You feel dizzy, sleepy, or off-balance.  Take over-the-counter and prescription medicines only as told by your health care provider. These include any supplements.  Eat a healthy diet and maintain a healthy weight. A healthy diet includes low-fat dairy products, low-fat (lean) meats, and fiber from whole grains, beans, and lots of fruits and vegetables. Home safety  Remove any tripping hazards, such as rugs, cords, and clutter.  Install safety equipment such as grab bars in bathrooms and safety rails on stairs.  Keep rooms and walkways well-lit. Activity   Follow a regular exercise program to stay fit. This will help you maintain your balance. Ask your health care provider what types of exercise are appropriate for you.  If you need a cane or   walker, use it as recommended by your health care provider.  Wear supportive shoes that have nonskid soles. Lifestyle  Do not drink alcohol if your health care provider tells you not to drink.  If you drink alcohol, limit how much you have: ? 0-1 drink a day for women. ? 0-2 drinks a day for men.  Be aware of how much alcohol is in your drink. In the [place] one drink equals one typical bottle of beer (12  oz), one-half glass of wine (5 oz), or one shot of hard liquor (1 oz).  Do not use any products that contain nicotine or tobacco, such as cigarettes and e-cigarettes. If you need help quitting, ask your health care provider. Summary  Having a healthy lifestyle and getting preventive care can help to protect your health and wellness after age 68.  Screening and testing are the best way to find a health problem early and help you avoid having a fall. Early diagnosis and treatment give you the best chance for managing medical conditions that are more common for people who are older than age 68.  Falls are a major cause of broken bones and head injuries in people who are older than age 68. Take precautions to prevent a fall at home.  Work with your health care provider to learn what changes you can make to improve your health and wellness and to prevent falls. This information is not intended to replace advice given to you by your health care provider. Make sure you discuss any questions you have with your health care provider. Document Released: 10/19/2017 Document Revised: 03/29/2019 Document Reviewed: 10/19/2017 Elsevier Patient Education  2020 Elsevier Inc.  

## 2019-12-13 ENCOUNTER — Telehealth: Payer: Self-pay

## 2019-12-13 LAB — CBC WITH DIFFERENTIAL/PLATELET
Basophils Absolute: 0 10*3/uL (ref 0.0–0.2)
Basos: 1 %
EOS (ABSOLUTE): 0.1 10*3/uL (ref 0.0–0.4)
Eos: 3 %
Hematocrit: 37.6 % (ref 34.0–46.6)
Hemoglobin: 12.9 g/dL (ref 11.1–15.9)
Immature Grans (Abs): 0 10*3/uL (ref 0.0–0.1)
Immature Granulocytes: 0 %
Lymphocytes Absolute: 1.5 10*3/uL (ref 0.7–3.1)
Lymphs: 42 %
MCH: 29.6 pg (ref 26.6–33.0)
MCHC: 34.3 g/dL (ref 31.5–35.7)
MCV: 86 fL (ref 79–97)
Monocytes Absolute: 0.4 10*3/uL (ref 0.1–0.9)
Monocytes: 11 %
Neutrophils Absolute: 1.5 10*3/uL (ref 1.4–7.0)
Neutrophils: 43 %
Platelets: 236 10*3/uL (ref 150–450)
RBC: 4.36 x10E6/uL (ref 3.77–5.28)
RDW: 12.7 % (ref 11.7–15.4)
WBC: 3.4 10*3/uL (ref 3.4–10.8)

## 2019-12-13 LAB — LIPID PANEL
Chol/HDL Ratio: 3.3 ratio (ref 0.0–4.4)
Cholesterol, Total: 167 mg/dL (ref 100–199)
HDL: 51 mg/dL (ref >39)
LDL Chol Calc (NIH): 96 mg/dL (ref 0–99)
Triglycerides: 109 mg/dL (ref 0–149)
VLDL Cholesterol Cal: 20 mg/dL (ref 5–40)

## 2019-12-13 LAB — COMPREHENSIVE METABOLIC PANEL
ALT: 13 IU/L (ref 0–32)
AST: 13 IU/L (ref 0–40)
Albumin/Globulin Ratio: 1.6 (ref 1.2–2.2)
Albumin: 4.3 g/dL (ref 3.8–4.8)
Alkaline Phosphatase: 63 IU/L (ref 39–117)
BUN/Creatinine Ratio: 14 (ref 12–28)
BUN: 13 mg/dL (ref 8–27)
Bilirubin Total: 0.3 mg/dL (ref 0.0–1.2)
CO2: 21 mmol/L (ref 20–29)
Calcium: 9.7 mg/dL (ref 8.7–10.3)
Chloride: 105 mmol/L (ref 96–106)
Creatinine, Ser: 0.96 mg/dL (ref 0.57–1.00)
GFR calc Af Amer: 70 mL/min/{1.73_m2} (ref >59)
GFR calc non Af Amer: 61 mL/min/{1.73_m2} (ref >59)
Globulin, Total: 2.7 g/dL (ref 1.5–4.5)
Glucose: 107 mg/dL — ABNORMAL HIGH (ref 65–99)
Potassium: 4.1 mmol/L (ref 3.5–5.2)
Sodium: 140 mmol/L (ref 134–144)
Total Protein: 7 g/dL (ref 6.0–8.5)

## 2019-12-13 LAB — HEMOGLOBIN A1C
Est. average glucose Bld gHb Est-mCnc: 157 mg/dL
Hgb A1c MFr Bld: 7.1 % — ABNORMAL HIGH (ref 4.8–5.6)

## 2019-12-13 NOTE — Telephone Encounter (Signed)
Pt advised.  She does not want to start medicine right now.  She states she knows what she needs to do to get her A1C back down.    Thanks,   -Mickel Baas

## 2019-12-13 NOTE — Telephone Encounter (Signed)
-----   Message from Mar Daring, [place] sent at 12/13/2019  8:15 AM EST ----- Blood count is normal. Kidney and liver function are normal. Sodium, potassium, and calcium are normal. Cholesterol is great. A1c is up from 6.9 7 months ago to 7.1 now. With the increase of your A1c it may be beneficial to consider starting a medication for your diabetes.

## 2019-12-16 LAB — URINE CULTURE

## 2019-12-24 ENCOUNTER — Ambulatory Visit: Payer: PPO

## 2019-12-27 ENCOUNTER — Telehealth: Payer: Self-pay

## 2019-12-27 ENCOUNTER — Other Ambulatory Visit: Payer: Self-pay

## 2019-12-27 ENCOUNTER — Ambulatory Visit
Admission: RE | Admit: 2019-12-27 | Discharge: 2019-12-27 | Disposition: A | Discharge status: Home / Self Care | Payer: Medicare Other | Source: Ambulatory Visit | Attending: Physician Assistant | Admitting: Physician Assistant

## 2019-12-27 DIAGNOSIS — K824 Cholesterolosis of gallbladder: Secondary | ICD-10-CM | POA: Diagnosis not present

## 2019-12-27 DIAGNOSIS — K760 Fatty (change of) liver, not elsewhere classified: Secondary | ICD-10-CM | POA: Diagnosis not present

## 2019-12-27 NOTE — Telephone Encounter (Signed)
Written by Mar Daring, PA-C on 12/27/2019 8:26 AM EST Seen by patient Holly Matthews on 12/27/2019 8:47 AM EST

## 2019-12-27 NOTE — Telephone Encounter (Signed)
-----   Message from Mar Daring, [place] sent at 12/27/2019  8:26 AM EST ----- Gallbladder polyp is essentially unchanged compared to previous study. There was noted fatty infiltration of the liver. This is a common finding and due to poor dietary habits with high fatty food content in diet. Limiting fatty foods and processed meats can help with this.

## 2020-01-03 ENCOUNTER — Telehealth: Payer: Self-pay | Admitting: Physician Assistant

## 2020-01-03 DIAGNOSIS — R3989 Other symptoms and signs involving the genitourinary system: Secondary | ICD-10-CM

## 2020-01-03 DIAGNOSIS — R350 Frequency of micturition: Secondary | ICD-10-CM

## 2020-01-03 MED ORDER — CEPHALEXIN 500 MG PO CAPS: 500.0000 mg | ORAL_CAPSULE | Freq: Two times a day (BID) | ORAL | 0 refills | Status: DC

## 2020-01-03 NOTE — Telephone Encounter (Signed)
refilled 

## 2020-01-03 NOTE — Telephone Encounter (Signed)
Patient is calling because she has a UTI before Christmas. Patient is having Frequency urinating and some burning. And she feels that is has returned. Patient is asking if cephALEXin (KEFLEX) 500 MG capsule [id number] can be refilled. Please advise CB- [phone]  Preferred [place]

## 2020-02-11 ENCOUNTER — Other Ambulatory Visit: Payer: Self-pay | Admitting: Physician Assistant

## 2020-02-11 DIAGNOSIS — E7800 Pure hypercholesterolemia, unspecified: Secondary | ICD-10-CM

## 2020-02-11 MED ORDER — ATORVASTATIN CALCIUM 10 MG PO TABS: ORAL_TABLET | ORAL | 1 refills | Status: DC

## 2020-02-11 MED ORDER — PANTOPRAZOLE SODIUM 40 MG PO TBEC: 40.0000 mg | DELAYED_RELEASE_TABLET | Freq: Two times a day (BID) | ORAL | 1 refills | Status: DC

## 2020-02-11 NOTE — Telephone Encounter (Signed)
[facility] faxed refill request for the following medications:  atorvastatin (LIPITOR) 10 MG tablet  pantoprazole (PROTONIX) 40 MG tablet     Please advise.

## 2020-06-29 ENCOUNTER — Other Ambulatory Visit: Payer: Self-pay | Admitting: Physician Assistant

## 2020-06-29 DIAGNOSIS — E7800 Pure hypercholesterolemia, unspecified: Secondary | ICD-10-CM

## 2020-06-29 NOTE — Telephone Encounter (Signed)
Requested Prescriptions  Pending Prescriptions Disp Refills  . atorvastatin (LIPITOR) 10 MG tablet [Pharmacy Med Name: ATORVASTATIN  10MG   TAB] 90 tablet 3    Sig: TAKE 1 TABLET BY MOUTH  DAILY     Cardiovascular:  Antilipid - Statins Failed - 06/29/2020  6:50 AM      Failed - LDL in normal range and within 360 days    LDL Cholesterol (Calc)  Date Value Ref Range Status  12/14/2017 113 (H) mg/dL (calc) Final    Comment:    Reference range: <100 . Desirable range <100 mg/dL for primary prevention;   <70 mg/dL for patients with CHD or diabetic patients  with > or = 2 CHD risk factors. Marland Kitchen LDL-C is now calculated using the Martin-Hopkins  calculation, which is a validated novel method providing  better accuracy than the Friedewald equation in the  estimation of LDL-C.  Cresenciano Genre et al. Annamaria Helling. 6433;295(18): 2061-2068  (http://education.[employer].com/faq/FAQ164)    LDL Chol Calc (NIH)  Date Value Ref Range Status  12/12/2019 96 0 - 99 mg/dL Final         Passed - Total Cholesterol in normal range and within 360 days    Cholesterol, Total  Date Value Ref Range Status  12/12/2019 167 100 - 199 mg/dL Final         Passed - HDL in normal range and within 360 days    HDL  Date Value Ref Range Status  12/12/2019 51 >39 mg/dL Final         Passed - Triglycerides in normal range and within 360 days    Triglycerides  Date Value Ref Range Status  12/12/2019 109 0 - 149 mg/dL Final         Passed - Patient is not pregnant      Passed - Valid encounter within last 12 months    Recent Outpatient Visits          6 months ago Annual physical exam   [place], Anderson [place] M, PA-C   1 year ago Type 2 diabetes mellitus without complication, without long-term current use of insulin [facility])   [facility] [place], Clearnce Sorrel, [place]   1 year ago Annual physical exam   [place], [place], PA-C   1 year ago Type 2  diabetes mellitus without complication, without long-term current use of insulin [facility])   [place], [place], [place]   2 years ago Rib contusion, right, initial encounter   [place], Jennifer M, PA-C             . pantoprazole ([place]) 40 MG tablet [Pharmacy Med Name: PANTOPRAZOLE SOD 40MG  EC TABLET] 180 tablet 3    Sig: TAKE 1 TABLET BY MOUTH  TWICE DAILY     Gastroenterology: Proton Pump Inhibitors Passed - 06/29/2020  6:50 AM      Passed - Valid encounter within last 12 months    Recent Outpatient Visits          6 months ago Annual physical exam   [place], Anderson Malta M, PA-C   1 year ago Type 2 diabetes mellitus without complication, without long-term current use of insulin [facility])   [place], Clearnce Sorrel, [place]   1 year ago Annual physical exam   [facility] [place] Malling M, [place]   1 year ago Type 2 diabetes mellitus without complication, without long-term current use of insulin ([place])  [place], [place]   2 years ago Rib contusion, right, initial encounter   [facility] [place], [place], [place]

## 2020-07-06 DIAGNOSIS — L089 Local infection of the skin and subcutaneous tissue, unspecified: Secondary | ICD-10-CM | POA: Diagnosis not present

## 2020-07-11 ENCOUNTER — Other Ambulatory Visit: Payer: Self-pay

## 2020-07-11 ENCOUNTER — Ambulatory Visit (INDEPENDENT_AMBULATORY_CARE_PROVIDER_SITE_OTHER): Payer: Medicare Other | Admitting: Physician Assistant

## 2020-07-11 ENCOUNTER — Encounter: Payer: Self-pay | Admitting: Physician Assistant

## 2020-07-11 VITALS — BP 113/76 | HR 80 | Temp 96.9°F | Wt 128.0 lb

## 2020-07-11 DIAGNOSIS — E119 Type 2 diabetes mellitus without complications: Secondary | ICD-10-CM

## 2020-07-11 DIAGNOSIS — E7800 Pure hypercholesterolemia, unspecified: Secondary | ICD-10-CM

## 2020-07-11 DIAGNOSIS — Z111 Encounter for screening for respiratory tuberculosis: Secondary | ICD-10-CM

## 2020-07-11 LAB — POCT GLYCOSYLATED HEMOGLOBIN (HGB A1C): Hemoglobin A1C: 7.4 % — AB (ref 4.0–5.6)

## 2020-07-11 NOTE — Patient Instructions (Signed)

## 2020-07-11 NOTE — Progress Notes (Signed)
Established patient visit   Patient: Holly Matthews   DOB: 12-Nov-1951   69 y.o. Female  MRN: [id number] Visit Date: 07/11/2020  Today's healthcare provider: Mar Daring, PA-C   Chief Complaint  Patient presents with  . Diabetes  . Hyperlipidemia   Subjective    HPI  Diabetes Mellitus Type II, follow-up  Lab Results  Component Value Date   HGBA1C 7.4 (A) 07/11/2020   HGBA1C 7.1 (H) 12/12/2019   HGBA1C 6.9 (A) 05/16/2019   Last seen for diabetes 6 months ago.  Management since then includes suggesting trying a diabetes medicine.  Pt was going to work on lifestyle changes first.  She reports excellent compliance with treatment. She is not having side effects.   Home blood sugar records: Are not being checked  Episodes of hypoglycemia? No    Current insulin regiment: None Most Recent Eye Exam: UTD  ---------------------------------------------------------------------------------------------------  Lipid/Cholesterol, follow-up  Last Lipid Panel: Lab Results  Component Value Date   CHOL 167 12/12/2019   LDLCALC 96 12/12/2019   HDL 51 12/12/2019   TRIG 109 12/12/2019    She was last seen for this 6 months ago.  Management since that visit includes no changes.  She reports excellent compliance with treatment. She is not having side effects.   Symptoms: No appetite changes No foot ulcerations  No chest pain No chest pressure/discomfort  No dyspnea No orthopnea  No fatigue No lower extremity edema  No palpitations No paroxysmal nocturnal dyspnea  No nausea No numbness or tingling of extremity  No polydipsia No polyuria  No speech difficulty No syncope   She is following a Regular diet.   Last metabolic panel Lab Results  Component Value Date   GLUCOSE 107 (H) 12/12/2019   NA 140 12/12/2019   K 4.1 12/12/2019   BUN 13 12/12/2019   CREATININE 0.96 12/12/2019   GFRNONAA 61 12/12/2019   GFRAA 70 12/12/2019   CALCIUM 9.7 12/12/2019   AST  13 12/12/2019   ALT 13 12/12/2019   The 10-year ASCVD risk score Mikey Bussing DC Jr., et al., 2013) is: 12.1%  ---------------------------------------------------------------------------------------------------      Medications: Outpatient Medications Prior to Visit  Medication Sig  . atorvastatin (LIPITOR) 10 MG tablet TAKE 1 TABLET BY MOUTH  DAILY  . calcium-vitamin D (OSCAL WITH D) 500-200 MG-UNIT TABS tablet Take 2 tablets by mouth daily.   . pantoprazole (PROTONIX) 40 MG tablet TAKE 1 TABLET BY MOUTH  TWICE DAILY  . [DISCONTINUED] cephALEXin (KEFLEX) 500 MG capsule Take 1 capsule (500 mg total) by mouth 2 (two) times daily.  . [DISCONTINUED] meclizine (ANTIVERT) 25 MG tablet Take 1 tablet (25 mg total) by mouth 3 (three) times daily as needed for dizziness.  . [DISCONTINUED] ondansetron (ZOFRAN) 4 MG tablet Take 1 tablet (4 mg total) by mouth daily as needed for nausea or vomiting.   No facility-administered medications prior to visit.    Review of Systems  Constitutional: Negative.   Respiratory: Negative.   Cardiovascular: Negative.   Gastrointestinal: Negative.   Endocrine: Negative for polydipsia, polyphagia and polyuria.  Neurological: Negative for dizziness, light-headedness and headaches.      Objective    BP 113/76 (BP Location: Left Arm, Patient Position: Sitting, Cuff Size: Normal)   Pulse 80   Temp (!) 96.9 F (36.1 C) (Temporal)   Wt 128 lb (58.1 kg)   BMI 21.30 kg/m    Physical Exam Vitals reviewed.  Constitutional:  General: She is not in acute distress.    Appearance: Normal appearance. She is well-developed and normal weight. She is not ill-appearing or diaphoretic.  Cardiovascular:     Rate and Rhythm: Normal rate and regular rhythm.     Pulses: Normal pulses.     Heart sounds: Normal heart sounds. No murmur heard.  No friction rub. No gallop.   Pulmonary:     Effort: Pulmonary effort is normal. No respiratory distress.     Breath sounds:  Normal breath sounds. No wheezing or rales.  Musculoskeletal:     Cervical back: Normal range of motion and neck supple.  Skin:    General: Skin is warm and dry.  Neurological:     Mental Status: She is alert.      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      Reynolds Bowl, PA-C, have reviewed all documentation for this visit. The documentation on 07/15/20 for the exam, diagnosis, procedures, and orders are all accurate and complete.   Rubye Beach  [facility] [phone] (phone) [phone] (fax)  [place]

## 2020-07-15 DIAGNOSIS — Z111 Encounter for screening for respiratory tuberculosis: Secondary | ICD-10-CM | POA: Insufficient documentation

## 2020-07-15 NOTE — Assessment & Plan Note (Signed)
Stable Continue Atorvastatin 10mg 

## 2020-07-15 NOTE — Assessment & Plan Note (Signed)
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

## 2020-07-15 NOTE — Assessment & Plan Note (Signed)
Slight increase again A1c now 7.4 Diet controlled Patient declines starting medications at this time F/U in 3-6 months

## 2020-07-18 ENCOUNTER — Telehealth: Payer: Self-pay

## 2020-07-18 LAB — QUANTIFERON-TB GOLD PLUS
QuantiFERON Mitogen Value: >10 IU/mL
QuantiFERON Nil Value: 0 IU/mL
QuantiFERON TB1 Ag Value: 0 IU/mL
QuantiFERON TB2 Ag Value: 0 IU/mL
QuantiFERON-TB Gold Plus: NEGATIVE

## 2020-07-18 NOTE — Telephone Encounter (Signed)
Quantiferon gold is normal.  Written by Mar Daring, PA-C on 07/18/2020 9:23 AM EDT Seen by patient Holly Matthews on 07/18/2020 9:57 AM

## 2020-07-18 NOTE — Telephone Encounter (Signed)
-----   Message from Mar Daring, PA-C sent at 07/18/2020  9:23 AM EDT ----- Quantiferon gold is normal.

## 2020-08-06 DIAGNOSIS — L821 Other seborrheic keratosis: Secondary | ICD-10-CM | POA: Diagnosis not present

## 2020-08-06 DIAGNOSIS — L570 Actinic keratosis: Secondary | ICD-10-CM | POA: Diagnosis not present

## 2020-10-15 DIAGNOSIS — H25013 Cortical age-related cataract, bilateral: Secondary | ICD-10-CM | POA: Diagnosis not present

## 2020-10-15 LAB — HM DIABETES EYE EXAM

## 2020-10-20 ENCOUNTER — Encounter: Payer: Self-pay | Admitting: Physician Assistant

## 2020-11-10 ENCOUNTER — Encounter: Payer: Self-pay | Admitting: Physician Assistant

## 2020-11-10 ENCOUNTER — Ambulatory Visit
Admission: RE | Admit: 2020-11-10 | Discharge: 2020-11-10 | Disposition: A | Discharge status: Home / Self Care | Payer: Medicare Other | Source: Ambulatory Visit | Attending: Physician Assistant | Admitting: Physician Assistant

## 2020-11-10 ENCOUNTER — Other Ambulatory Visit: Payer: Self-pay

## 2020-11-10 ENCOUNTER — Telehealth: Payer: Self-pay

## 2020-11-10 ENCOUNTER — Ambulatory Visit (INDEPENDENT_AMBULATORY_CARE_PROVIDER_SITE_OTHER): Payer: Medicare Other | Admitting: Physician Assistant

## 2020-11-10 VITALS — BP 136/84 | HR 84 | Temp 98.6°F | Resp 16 | Wt 128.2 lb

## 2020-11-10 DIAGNOSIS — M79661 Pain in right lower leg: Secondary | ICD-10-CM | POA: Diagnosis not present

## 2020-11-10 DIAGNOSIS — M79604 Pain in right leg: Secondary | ICD-10-CM | POA: Diagnosis not present

## 2020-11-10 NOTE — Telephone Encounter (Signed)
Resulted via [employer]

## 2020-11-10 NOTE — Telephone Encounter (Signed)
Please advise 

## 2020-11-10 NOTE — Telephone Encounter (Signed)
Butch Penny with [facility] calling to repot ultrasound does not show DVT. Michelle at the practice notified.

## 2020-11-10 NOTE — Progress Notes (Signed)
Established patient visit   Patient: Holly Matthews   DOB: 1951/12/17   69 y.o. Female  MRN: [id number] Visit Date: 11/10/2020  Today's healthcare provider: Mar Daring, PA-C   Chief Complaint  Patient presents with  . Leg Pain   Subjective    Leg Pain  The incident occurred 5 to 7 days ago. There was no injury mechanism. The pain is present in the right leg. Quality: throbbing pain calf. The pain is at a severity of 3/10. The pain is mild. The pain has been fluctuating since onset. Pertinent negatives include no inability to bear weight, muscle weakness, numbness or tingling. She reports no foreign bodies present. Nothing (feels it more when she is sitting) aggravates the symptoms. She has tried nothing for the symptoms. The treatment provided no relief.     Patient Active Problem List   Diagnosis Date Noted  . Screening-pulmonary TB 07/15/2020  . Near syncope 10/11/2018  . Heart palpitations 10/11/2018  . Dyspepsia 09/05/2018  . Allergic rhinitis 05/06/2015  . Acid reflux 05/06/2015  . History of colon polyps 05/06/2015  . H/O renal calculi 05/06/2015  . Hypercholesteremia 05/06/2015  . Irregular cardiac rhythm 05/06/2015  . Diabetes mellitus, type 2 ([place]) 05/06/2015  . Adenomatous polyp 05/06/2015   Past Medical History:  Diagnosis Date  . Allergy   . Diabetes mellitus without complication ([place])    Diet controlled  . GERD (gastroesophageal reflux disease)   . Hyperlipidemia   . Serum cholesterol elevated        Medications: Outpatient Medications Prior to Visit  Medication Sig  . atorvastatin (LIPITOR) 10 MG tablet TAKE 1 TABLET BY MOUTH  DAILY  . calcium-vitamin D (OSCAL WITH D) 500-200 MG-UNIT TABS tablet Take 2 tablets by mouth daily.   . pantoprazole (PROTONIX) 40 MG tablet TAKE 1 TABLET BY MOUTH  TWICE DAILY   No facility-administered medications prior to visit.    Review of Systems  Constitutional: Negative.   Respiratory: Negative.     Cardiovascular: Negative.  Negative for leg swelling.  Musculoskeletal: Positive for myalgias.  Neurological: Negative for tingling, weakness and numbness.    Last CBC Lab Results  Component Value Date   WBC 3.4 12/12/2019   HGB 12.9 12/12/2019   HCT 37.6 12/12/2019   MCV 86 12/12/2019   MCH 29.6 12/12/2019   RDW 12.7 12/12/2019   PLT 236 62/37/6283   Last metabolic panel Lab Results  Component Value Date   GLUCOSE 107 (H) 12/12/2019   NA 140 12/12/2019   K 4.1 12/12/2019   CL 105 12/12/2019   CO2 21 12/12/2019   BUN 13 12/12/2019   CREATININE 0.96 12/12/2019   GFRNONAA 61 12/12/2019   GFRAA 70 12/12/2019   CALCIUM 9.7 12/12/2019   PHOS 2.8 (L) 05/28/2019   PROT 7.0 12/12/2019   ALBUMIN 4.3 12/12/2019   LABGLOB 2.7 12/12/2019   AGRATIO 1.6 12/12/2019   BILITOT 0.3 12/12/2019   ALKPHOS 63 12/12/2019   AST 13 12/12/2019   ALT 13 12/12/2019   ANIONGAP 11 10/14/2019      Objective    BP 136/84 (BP Location: Left Arm, Patient Position: Sitting, Cuff Size: Normal)   Pulse 84   Temp 98.6 F (37 C) (Oral)   Resp 16   Wt 128 lb 3.2 oz (58.2 kg)   BMI 21.33 kg/m  BP Readings from Last 3 Encounters:  11/10/20 136/84  07/11/20 113/76  12/12/19 113/61   Wt Readings from Last  3 Encounters:  11/10/20 128 lb 3.2 oz (58.2 kg)  07/11/20 128 lb (58.1 kg)  12/12/19 128 lb (58.1 kg)      Physical Exam Vitals reviewed.  Constitutional:      General: She is not in acute distress.    Appearance: Normal appearance. She is well-developed and normal weight. She is not ill-appearing.  HENT:     Head: Normocephalic and atraumatic.  Cardiovascular:     Pulses: Normal pulses.  Pulmonary:     Effort: Pulmonary effort is normal. No respiratory distress.  Musculoskeletal:     Cervical back: Normal range of motion and neck supple.     Right lower leg: Tenderness present. No swelling, deformity, lacerations or bony tenderness.     Right ankle:     Right Achilles Tendon:  Normal. No tenderness or defects. Thompson's test negative.       Legs:  Neurological:     Mental Status: She is alert.  Psychiatric:        Mood and Affect: Mood normal.        Behavior: Behavior normal.        Thought Content: Thought content normal.        Judgment: Judgment normal.    CLINICAL DATA:  Right lower extremity pain for the past 4 days  EXAM: RIGHT LOWER EXTREMITY VENOUS DOPPLER ULTRASOUND  TECHNIQUE: Gray-scale sonography with compression, as well as color and duplex ultrasound, were performed to evaluate the deep venous system(s) from the level of the common femoral vein through the popliteal and proximal calf veins.  COMPARISON:  None.  FINDINGS: VENOUS  Normal compressibility of the common femoral, superficial femoral, and popliteal veins, as well as the visualized calf veins. Visualized portions of profunda femoral vein and great saphenous vein unremarkable. No filling defects to suggest DVT on grayscale or color Doppler imaging. Doppler waveforms show normal direction of venous flow, normal respiratory plasticity and response to augmentation.  Limited views of the contralateral common femoral vein are unremarkable.  OTHER  None.  Limitations: none  IMPRESSION: Negative.   Electronically Signed   By: Jacqulynn Cadet M.D.   On: 11/10/2020 13:19  No results found for any visits on 11/10/20.  Assessment & Plan     1. Pain in right lower leg Suspect calf strain. Will get imaging to R/O DVT.   [place] negative. Advised to treat conservatively with heat, light stretches, good foot support shoes, may apply voltaren gel to area. Call if worsening.  - US Venous Img Lower Unilateral Right; Future   No follow-ups on file.      Reynolds Bowl, PA-C, have reviewed all documentation for this visit. The documentation on 11/12/20 for the exam, diagnosis, procedures, and orders are all accurate and complete.   Rubye Beach  [facility] [phone] (phone) [phone] (fax)  [place]

## 2020-11-12 ENCOUNTER — Encounter: Payer: Self-pay | Admitting: Physician Assistant

## 2020-11-12 NOTE — Patient Instructions (Signed)
Muscle Strain  A muscle strain is an injury that occurs when a muscle is stretched beyond its normal length. Usually, a small number of muscle fibers are torn when this happens. There are three types of muscle strains. First-degree strains have the least amount of muscle fiber tearing and the least amount of pain. Second-degree and third-degree strains have more tearing and pain.  Usually, recovery from muscle strain takes 1-2 weeks. Complete healing normally takes 5-6 weeks.  What are the causes?  This condition is caused when a sudden, violent force is placed on a muscle and stretches it too far. This may occur with a fall, lifting, or sports.  What increases the risk?  This condition is more likely to develop in athletes and people who are physically active.  What are the signs or symptoms?  Symptoms of this condition include:  · Pain.  · Bruising.  · Swelling.  · Trouble using the muscle.  How is this diagnosed?  This condition is diagnosed based on a physical exam and your medical history. Tests may also be done, including an X-ray, ultrasound, or MRI.  How is this treated?  This condition is initially treated with PRICE therapy. This therapy involves:  · Protecting the muscle from being injured again.  · Resting the injured muscle.  · Icing the injured muscle.  · Applying pressure (compression) to the injured muscle. This may be done with a splint or elastic bandage.  · Raising (elevating) the injured muscle.  Your health care provider may also recommend medicine for pain.  Follow these instructions at home:  If you have a splint:  · Wear the splint as told by your health care provider. Remove it only as told by your health care provider.  · Loosen the splint if your fingers or toes tingle, become numb, or turn cold and blue.  · Keep the splint clean.  · If the splint is not waterproof:  ? Do not let it get wet.  ? Cover it with a watertight covering when you take a bath or a shower.  Managing pain, stiffness,  and swelling    · If directed, put ice on the injured area.  ? If you have a removable splint, remove it as told by your health care provider.  ? Put ice in a plastic bag.  ? Place a towel between your skin and the bag.  ? Leave the ice on for 20 minutes, 2-3 times a day.  · Move your fingers or toes often to avoid stiffness and to lessen swelling.  · Raise (elevate) the injured area above the level of your heart while you are sitting or lying down.  · Wear an elastic bandage as told by your health care provider. Make sure that it is not too tight.  General instructions  · Take over-the-counter and prescription medicines only as told by your health care provider.  · Restrict your activity and rest the injured muscle as told by your health care provider. Gentle movements may be allowed.  · If physical therapy was prescribed, do exercises as told by your health care provider.  · Do not put pressure on any part of the splint until it is fully hardened. This may take several hours.  · Do not use any products that contain nicotine or tobacco, such as cigarettes and e-cigarettes. These can delay bone healing. If you need help quitting, ask your health care provider.  · Ask your health care provider when it   You have more pain or swelling in the injured area. Get help right away if:  You have numbness or tingling or lose a lot of strength in the injured area. Summary  A muscle strain is an injury that occurs when a muscle is stretched beyond its normal length.  This condition is caused when a sudden, violent force is placed on a muscle and stretches it too far.  This condition is initially treated with PRICE therapy, which involves protecting, resting,  icing, compressing, and elevating.  Gentle movements may be allowed. If physical therapy was prescribed, do exercises as told by your health care provider. This information is not intended to replace advice given to you by your health care provider. Make sure you discuss any questions you have with your health care provider. Document Revised: 11/18/2017 Document Reviewed: 01/12/2017 Elsevier Patient Education  2020 [employer].

## 2020-12-18 ENCOUNTER — Encounter: Payer: Medicare Other | Admitting: Physician Assistant

## 2020-12-31 ENCOUNTER — Other Ambulatory Visit: Payer: Self-pay | Admitting: Physician Assistant

## 2020-12-31 DIAGNOSIS — E7800 Pure hypercholesterolemia, unspecified: Secondary | ICD-10-CM

## 2021-01-02 ENCOUNTER — Encounter: Payer: Medicare Other | Admitting: Physician Assistant

## 2021-01-06 ENCOUNTER — Other Ambulatory Visit: Payer: Self-pay | Admitting: Physician Assistant

## 2021-01-28 ENCOUNTER — Encounter: Payer: Medicare Other | Admitting: Physician Assistant

## 2021-02-02 ENCOUNTER — Encounter: Payer: Self-pay | Admitting: Physician Assistant

## 2021-02-02 ENCOUNTER — Other Ambulatory Visit: Payer: Self-pay

## 2021-02-02 ENCOUNTER — Ambulatory Visit (INDEPENDENT_AMBULATORY_CARE_PROVIDER_SITE_OTHER): Payer: Medicare Other | Admitting: Physician Assistant

## 2021-02-02 VITALS — BP 115/71 | HR 80 | Temp 98.5°F | Ht 60.0 in | Wt 126.0 lb

## 2021-02-02 DIAGNOSIS — E7800 Pure hypercholesterolemia, unspecified: Secondary | ICD-10-CM

## 2021-02-02 DIAGNOSIS — Z0000 Encounter for general adult medical examination without abnormal findings: Secondary | ICD-10-CM | POA: Diagnosis not present

## 2021-02-02 DIAGNOSIS — Z1239 Encounter for other screening for malignant neoplasm of breast: Secondary | ICD-10-CM

## 2021-02-02 DIAGNOSIS — E119 Type 2 diabetes mellitus without complications: Secondary | ICD-10-CM

## 2021-02-02 NOTE — Patient Instructions (Signed)
[facility] [address] [place],  [place]  [address] Main: [phone]   Preventive Care 70 Years and Older, Female Preventive care refers to lifestyle choices and visits with your health care provider that can promote health and wellness. This includes:  A yearly physical exam. This is also called an annual wellness visit.  Regular dental and eye exams.  Immunizations.  Screening for certain conditions.  Healthy lifestyle choices, such as: ? Eating a healthy diet. ? Getting regular exercise. ? Not using drugs or products that contain nicotine and tobacco. ? Limiting alcohol use. What can I expect for my preventive care visit? Physical exam Your health care provider will check your:  Height and weight. These may be used to calculate your BMI (body mass index). BMI is a measurement that tells if you are at a healthy weight.  Heart rate and blood pressure.  Body temperature.  Skin for abnormal spots. Counseling Your health care provider may ask you questions about your:  Past medical problems.  Family's medical history.  Alcohol, tobacco, and drug use.  Emotional well-being.  Home life and relationship well-being.  Sexual activity.  Diet, exercise, and sleep habits.  History of falls.  Memory and ability to understand (cognition).  Work and work environment.  Pregnancy and menstrual history.  Access to firearms. What immunizations do I need? Vaccines are usually given at various ages, according to a schedule. Your health care provider will recommend vaccines for you based on your age, medical history, and lifestyle or other factors, such as travel or where you work.   What tests do I need? Blood tests  Lipid and cholesterol levels. These may be checked every 5 years, or more often depending on your overall health.  Hepatitis C test.  Hepatitis B test. Screening  Lung cancer screening. You may have this  screening every year starting at age 55 if you have a 30-pack-year history of smoking and currently smoke or have quit within the past 15 years.  Colorectal cancer screening. ? All adults should have this screening starting at age 50 and continuing until age 75. ? Your health care provider may recommend screening at age 45 if you are at increased risk. ? You will have tests every 1-10 years, depending on your results and the type of screening test.  Diabetes screening. ? This is done by checking your blood sugar (glucose) after you have not eaten for a while (fasting). ? You may have this done every 1-3 years.  Mammogram. ? This may be done every 1-2 years. ? Talk with your health care provider about how often you should have regular mammograms.  Abdominal aortic aneurysm (AAA) screening. You may need this if you are a current or former smoker.  BRCA-related cancer screening. This may be done if you have a family history of breast, ovarian, tubal, or peritoneal cancers. Other tests  STD (sexually transmitted disease) testing, if you are at risk.  Bone density scan. This is done to screen for osteoporosis. You may have this done starting at age 70. Talk with your health care provider about your test results, treatment options, and if necessary, the need for more tests. Follow these instructions at home: Eating and drinking  Eat a diet that includes fresh fruits and vegetables, whole grains, lean protein, and low-fat dairy products. Limit your intake of foods with high amounts of sugar, saturated fats, and salt.  Take vitamin and mineral supplements as recommended by   your health care provider.  Do not drink alcohol if your health care provider tells you not to drink.  If you drink alcohol: ? Limit how much you have to 0-1 drink a day. ? Be aware of how much alcohol is in your drink. In the [place] one drink equals one 12 oz bottle of beer (355 mL), one 5 oz glass of wine (148 mL), or  one 1 oz glass of hard liquor (44 mL).   Lifestyle  Take daily care of your teeth and gums. Brush your teeth every morning and night with fluoride toothpaste. Floss one time each day.  Stay active. Exercise for at least 30 minutes 5 or more days each week.  Do not use any products that contain nicotine or tobacco, such as cigarettes, e-cigarettes, and chewing tobacco. If you need help quitting, ask your health care provider.  Do not use drugs.  If you are sexually active, practice safe sex. Use a condom or other form of protection in order to prevent STIs (sexually transmitted infections).  Talk with your health care provider about taking a low-dose aspirin or statin.  Find healthy ways to cope with stress, such as: ? Meditation, yoga, or listening to music. ? Journaling. ? Talking to a trusted person. ? Spending time with friends and family. Safety  Always wear your seat belt while driving or riding in a vehicle.  Do not drive: ? If you have been drinking alcohol. Do not ride with someone who has been drinking. ? When you are tired or distracted. ? While texting.  Wear a helmet and other protective equipment during sports activities.  If you have firearms in your house, make sure you follow all gun safety procedures. What's next?  Visit your health care provider once a year for an annual wellness visit.  Ask your health care provider how often you should have your eyes and teeth checked.  Stay up to date on all vaccines. This information is not intended to replace advice given to you by your health care provider. Make sure you discuss any questions you have with your health care provider. Document Revised: 11/26/2020 Document Reviewed: 11/30/2018 Elsevier Patient Education  2021 [employer].

## 2021-02-02 NOTE — Progress Notes (Signed)
Annual Wellness Visit     Patient: Holly Matthews, Female    DOB: 1951-08-31, 70 y.o.   MRN: [id number] Visit Date: 02/02/2021  Today's Provider: Mar Daring, PA-C   Chief Complaint  Patient presents with  . Annual Exam   Subjective    Holly Matthews is a 70 y.o. female who presents today for her Annual Wellness Visit. She reports consuming a general diet. The patient does not participate in regular exercise at present. She generally feels well. She reports sleeping well. She does not have additional problems to discuss today.   HPI   Patient Active Problem List   Diagnosis Date Noted  . Screening-pulmonary TB 07/15/2020  . Near syncope 10/11/2018  . Heart palpitations 10/11/2018  . Dyspepsia 09/05/2018  . Allergic rhinitis 05/06/2015  . Acid reflux 05/06/2015  . History of colon polyps 05/06/2015  . H/O renal calculi 05/06/2015  . Hypercholesteremia 05/06/2015  . Irregular cardiac rhythm 05/06/2015  . Diabetes mellitus, type 2 ([place]) 05/06/2015  . Adenomatous polyp 05/06/2015   Past Medical History:  Diagnosis Date  . Allergy   . Diabetes mellitus without complication ([place])    Diet controlled  . GERD (gastroesophageal reflux disease)   . Hyperlipidemia   . Serum cholesterol elevated    Social History   Tobacco Use  . Smoking status: Never Smoker  . Smokeless tobacco: Never Used  Vaping Use  . Vaping Use: Never used  Substance Use Topics  . Alcohol use: No  . Drug use: No   Allergies  Allergen Reactions  . Codeine      Medications: Outpatient Medications Prior to Visit  Medication Sig  . atorvastatin (LIPITOR) 10 MG tablet TAKE 1 TABLET BY MOUTH  DAILY  . calcium-vitamin D (OSCAL WITH D) 500-200 MG-UNIT TABS tablet Take 2 tablets by mouth daily.   . pantoprazole (PROTONIX) 40 MG tablet TAKE 1 TABLET BY MOUTH  TWICE DAILY   No facility-administered medications prior to visit.    Allergies  Allergen Reactions  . Codeine     Patient  Care Team: Rubye Beach as PCP - General (Family Medicine)  Review of Systems  Constitutional: Negative.   HENT: Negative.   Eyes: Negative.   Respiratory: Negative.   Cardiovascular: Negative.   Gastrointestinal: Negative.   Endocrine: Negative.   Genitourinary: Negative.   Musculoskeletal: Positive for myalgias. Negative for arthralgias, back pain, gait problem, joint swelling, neck pain and neck stiffness.  Skin: Negative.   Allergic/Immunologic: Positive for environmental allergies. Negative for food allergies and immunocompromised state.  Neurological: Negative.   Hematological: Negative.   Psychiatric/Behavioral: Negative.       Objective    Vitals: BP 115/71 (BP Location: Left Arm, Patient Position: Sitting, Cuff Size: Normal)   Pulse 80   Temp 98.5 F (36.9 C) (Oral)   Ht 5' (1.524 m)   Wt 126 lb (57.2 kg)   BMI 24.61 kg/m    Physical Exam Vitals reviewed.  Constitutional:      General: She is not in acute distress.    Appearance: Normal appearance. She is well-developed, normal weight and well-nourished. She is not ill-appearing or diaphoretic.  HENT:     Head: Normocephalic and atraumatic.     Right Ear: Tympanic membrane, ear canal and external ear normal.     Left Ear: Tympanic membrane, ear canal and external ear normal.     Mouth/Throat:     Mouth: Oropharynx is clear and  moist.  Eyes:     General: No scleral icterus.       Right eye: No discharge.        Left eye: No discharge.     Extraocular Movements: Extraocular movements intact and EOM normal.     Conjunctiva/sclera: Conjunctivae normal.     Pupils: Pupils are equal, round, and reactive to light.  Neck:     Thyroid: No thyromegaly.     Vascular: No carotid bruit or JVD.     Trachea: No tracheal deviation.  Cardiovascular:     Rate and Rhythm: Normal rate and regular rhythm.     Pulses: Normal pulses and intact distal pulses.     Heart sounds: Normal heart sounds. No murmur  heard. No friction rub. No gallop.   Pulmonary:     Effort: Pulmonary effort is normal. No respiratory distress.     Breath sounds: Normal breath sounds. No wheezing or rales.  Chest:     Chest wall: No tenderness.  Abdominal:     General: Abdomen is flat. Bowel sounds are normal. There is no distension.     Palpations: Abdomen is soft. There is no mass.     Tenderness: There is no abdominal tenderness. There is no guarding or rebound.  Musculoskeletal:        General: No tenderness or edema. Normal range of motion.     Cervical back: Normal range of motion and neck supple. No tenderness.     Right lower leg: No edema.     Left lower leg: No edema.  Lymphadenopathy:     Cervical: No cervical adenopathy.  Skin:    General: Skin is warm and dry.     Capillary Refill: Capillary refill takes less than 2 seconds.     Findings: No rash.  Neurological:     General: No focal deficit present.     Mental Status: She is alert and oriented to person, place, and time. Mental status is at baseline.  Psychiatric:        Mood and Affect: Mood and affect and mood normal.        Behavior: Behavior normal.        Thought Content: Thought content normal.        Judgment: Judgment normal.     Most recent functional status assessment: In your present state of health, do you have any difficulty performing the following activities: 02/02/2021  Hearing? N  Vision? N  Difficulty concentrating or making decisions? N  Walking or climbing stairs? N  Dressing or bathing? N  Doing errands, shopping? N  Some recent data might be hidden   Most recent fall risk assessment: Fall Risk  02/02/2021  Falls in the past year? 0  Number falls in past yr: 0  Injury with Fall? 0  Risk for fall due to : No Fall Risks  Follow up Follow up appointment    Most recent depression screenings: PHQ 2/9 Scores 02/02/2021 11/10/2020  PHQ - 2 Score 0 0  PHQ- 9 Score 0 -   Most recent cognitive screening: No flowsheet  data found. Most recent Audit-C alcohol use screening Alcohol Use Disorder Test (AUDIT) 02/02/2021  1. How often do you have a drink containing alcohol? 0  2. How many drinks containing alcohol do you have on a typical day when you are drinking? 0  3. How often do you have six or more drinks on one occasion? 0  AUDIT-C Score 0  Alcohol Brief  Interventions/Follow-up AUDIT Score <7 follow-up not indicated   A score of 3 or more in women, and 4 or more in men indicates increased risk for alcohol abuse, EXCEPT if all of the points are from question 1   Results for orders placed or performed in visit on 02/02/21  CBC w/Diff/Platelet  Result Value Ref Range   WBC 3.2 (L) 3.4 - 10.8 x10E3/uL   RBC 4.55 3.77 - 5.28 x10E6/uL   Hemoglobin 12.9 11.1 - 15.9 g/dL   Hematocrit 39.7 34.0 - 46.6 %   MCV 87 79 - 97 fL   MCH 28.4 26.6 - 33.0 pg   MCHC 32.5 31.5 - 35.7 g/dL   RDW 13.2 11.7 - 15.4 %   Platelets 206 150 - 450 x10E3/uL   Neutrophils 45 Not Estab. %   Lymphs 32 Not Estab. %   Monocytes 16 Not Estab. %   Eos 6 Not Estab. %   Basos 1 Not Estab. %   Neutrophils Absolute 1.4 1.4 - 7.0 x10E3/uL   Lymphocytes Absolute 1.0 0.7 - 3.1 x10E3/uL   Monocytes Absolute 0.5 0.1 - 0.9 x10E3/uL   EOS (ABSOLUTE) 0.2 0.0 - 0.4 x10E3/uL   Basophils Absolute 0.0 0.0 - 0.2 x10E3/uL   Immature Granulocytes 0 Not Estab. %   Immature Grans (Abs) 0.0 0.0 - 0.1 x10E3/uL  Comprehensive Metabolic Panel (CMET)  Result Value Ref Range   Glucose 128 (H) 65 - 99 mg/dL   BUN 13 8 - 27 mg/dL   Creatinine, Ser 0.99 0.57 - 1.00 mg/dL   GFR calc non Af Amer 58 (L) >59 mL/min/1.73   GFR calc Af Amer 67 >59 mL/min/1.73   BUN/Creatinine Ratio 13 12 - 28   Sodium 140 134 - 144 mmol/L   Potassium 4.2 3.5 - 5.2 mmol/L   Chloride 104 96 - 106 mmol/L   CO2 21 20 - 29 mmol/L   Calcium 9.8 8.7 - 10.3 mg/dL   Total Protein 7.2 6.0 - 8.5 g/dL   Albumin 4.5 3.8 - 4.8 g/dL   Globulin, Total 2.7 1.5 - 4.5 g/dL    Albumin/Globulin Ratio 1.7 1.2 - 2.2   Bilirubin Total 0.4 0.0 - 1.2 mg/dL   Alkaline Phosphatase 67 44 - 121 IU/L   AST 17 0 - 40 IU/L   ALT 15 0 - 32 IU/L  TSH  Result Value Ref Range   TSH 1.620 0.450 - 4.500 uIU/mL  Lipid Panel With LDL/HDL Ratio  Result Value Ref Range   Cholesterol, Total 186 100 - 199 mg/dL   Triglycerides 119 0 - 149 mg/dL   HDL 57 >39 mg/dL   VLDL Cholesterol Cal 21 5 - 40 mg/dL   LDL Chol Calc (NIH) 108 (H) 0 - 99 mg/dL   LDL/HDL Ratio 1.9 0.0 - 3.2 ratio  HgB A1c  Result Value Ref Range   Hgb A1c MFr Bld 7.4 (H) 4.8 - 5.6 %   Est. average glucose Bld gHb Est-mCnc 166 mg/dL  POCT UA - Microalbumin  Result Value Ref Range   Microalbumin Ur, POC negative mg/L   Creatinine, POC     Albumin/Creatinine Ratio, Urine, POC      Assessment & Plan     Annual wellness visit done today including the all of the following: Reviewed patient's Family Medical History Reviewed and updated list of patient's medical providers Assessment of cognitive impairment was done Assessed patient's functional ability Established a written schedule for health screening [place] Completed  and Reviewed  Exercise Activities and Dietary recommendations Goals    . Exercise 3x per week (30 min per time)     Recommend to start back exercising for 3 days a week for 30-45 minutes.        Immunization History  Administered Date(s) Administered  . Influenza Split 10/25/2011  . Influenza, High Dose Seasonal PF 11/24/2016, 11/25/2017  . Influenza,inj,Quad PF,6+ Mos 12/06/2018, 10/03/2019  . Moderna Sars-Covid-2 Vaccination 01/02/2020, 01/30/2020  . Pneumococcal Conjugate-13 11/24/2016  . Pneumococcal Polysaccharide-23 05/16/2018    Health Maintenance  Topic Date Due  . TETANUS/TDAP  Never done  . MAMMOGRAM  03/30/2019  . FOOT EXAM  12/07/2019  . COVID-19 Vaccine (3 - Booster for Moderna series) 07/29/2020  . INFLUENZA VACCINE  03/19/2021 (Originally  07/20/2020)  . HEMOGLOBIN A1C  08/02/2021  . OPHTHALMOLOGY EXAM  10/15/2021  . URINE MICROALBUMIN  02/15/2022  . COLONOSCOPY (Pts 45-8yrs Insurance coverage will need to be confirmed)  11/07/2023  . DEXA SCAN  Completed  . Hepatitis C Screening  Completed  . PNA vac Low Risk Adult  Completed     Discussed health benefits of physical activity, and encouraged her to engage in regular exercise appropriate for her age and condition.  1. Medicare annual wellness visit, subsequent Normal exam. Up to date on screenings and vaccinations.   2. Annual physical exam Normal physical exam today. Will check labs as below and f/u pending lab results. If labs are stable and WNL she will not need to have these rechecked for one year at her next annual physical exam. She is to call the office in the meantime if she has any acute issue, questions or concerns. - CBC w/Diff/Platelet - Comprehensive Metabolic Panel (CMET) - TSH - Lipid Panel With LDL/HDL Ratio - HgB A1c  3. Encounter for breast cancer screening using non-mammogram modality Breast exam today was normal. There is no family history of breast cancer. She does perform regular self breast exams. Mammogram was ordered as below. Information for [facility] was given to patient so she may schedule her mammogram at her convenience. - MM 3D SCREEN BREAST BILATERAL; Future  4. Type 2 diabetes mellitus without complication, without long-term current use of insulin (HCC) Diet controlled. Will check labs as below and f/u pending results. - CBC w/Diff/Platelet - Comprehensive Metabolic Panel (CMET) - TSH - HgB A1c - POCT UA - Microalbumin  5. Hypercholesteremia Stable on atorvastatin 10mg . Will check labs as below and f/u pending results. - CBC w/Diff/Platelet - Comprehensive Metabolic Panel (CMET) - TSH - Lipid Panel With LDL/HDL Ratio     No follow-ups on file.     Reynolds Bowl, PA-C, have reviewed all documentation  for this visit. The documentation on 02/15/21 for the exam, diagnosis, procedures, and orders are all accurate and complete.   Rubye Beach  [facility] [phone] (phone) [phone] (fax)  [place]

## 2021-02-03 LAB — HEMOGLOBIN A1C
Est. average glucose Bld gHb Est-mCnc: 166 mg/dL
Hgb A1c MFr Bld: 7.4 % — ABNORMAL HIGH (ref 4.8–5.6)

## 2021-02-03 LAB — CBC WITH DIFFERENTIAL/PLATELET
Basophils Absolute: 0 10*3/uL (ref 0.0–0.2)
Basos: 1 %
EOS (ABSOLUTE): 0.2 10*3/uL (ref 0.0–0.4)
Eos: 6 %
Hematocrit: 39.7 % (ref 34.0–46.6)
Hemoglobin: 12.9 g/dL (ref 11.1–15.9)
Immature Grans (Abs): 0 10*3/uL (ref 0.0–0.1)
Immature Granulocytes: 0 %
Lymphocytes Absolute: 1 10*3/uL (ref 0.7–3.1)
Lymphs: 32 %
MCH: 28.4 pg (ref 26.6–33.0)
MCHC: 32.5 g/dL (ref 31.5–35.7)
MCV: 87 fL (ref 79–97)
Monocytes Absolute: 0.5 10*3/uL (ref 0.1–0.9)
Monocytes: 16 %
Neutrophils Absolute: 1.4 10*3/uL (ref 1.4–7.0)
Neutrophils: 45 %
Platelets: 206 10*3/uL (ref 150–450)
RBC: 4.55 x10E6/uL (ref 3.77–5.28)
RDW: 13.2 % (ref 11.7–15.4)
WBC: 3.2 10*3/uL — ABNORMAL LOW (ref 3.4–10.8)

## 2021-02-03 LAB — COMPREHENSIVE METABOLIC PANEL
ALT: 15 IU/L (ref 0–32)
AST: 17 IU/L (ref 0–40)
Albumin/Globulin Ratio: 1.7 (ref 1.2–2.2)
Albumin: 4.5 g/dL (ref 3.8–4.8)
Alkaline Phosphatase: 67 IU/L (ref 44–121)
BUN/Creatinine Ratio: 13 (ref 12–28)
BUN: 13 mg/dL (ref 8–27)
Bilirubin Total: 0.4 mg/dL (ref 0.0–1.2)
CO2: 21 mmol/L (ref 20–29)
Calcium: 9.8 mg/dL (ref 8.7–10.3)
Chloride: 104 mmol/L (ref 96–106)
Creatinine, Ser: 0.99 mg/dL (ref 0.57–1.00)
GFR calc Af Amer: 67 mL/min/{1.73_m2} (ref >59)
GFR calc non Af Amer: 58 mL/min/{1.73_m2} — ABNORMAL LOW (ref >59)
Globulin, Total: 2.7 g/dL (ref 1.5–4.5)
Glucose: 128 mg/dL — ABNORMAL HIGH (ref 65–99)
Potassium: 4.2 mmol/L (ref 3.5–5.2)
Sodium: 140 mmol/L (ref 134–144)
Total Protein: 7.2 g/dL (ref 6.0–8.5)

## 2021-02-03 LAB — TSH: TSH: 1.62 u[IU]/mL (ref 0.450–4.500)

## 2021-02-03 LAB — LIPID PANEL WITH LDL/HDL RATIO
Cholesterol, Total: 186 mg/dL (ref 100–199)
HDL: 57 mg/dL (ref >39)
LDL Chol Calc (NIH): 108 mg/dL — ABNORMAL HIGH (ref 0–99)
LDL/HDL Ratio: 1.9 ratio (ref 0.0–3.2)
Triglycerides: 119 mg/dL (ref 0–149)
VLDL Cholesterol Cal: 21 mg/dL (ref 5–40)

## 2021-02-10 ENCOUNTER — Other Ambulatory Visit: Payer: Self-pay

## 2021-02-10 ENCOUNTER — Ambulatory Visit (INDEPENDENT_AMBULATORY_CARE_PROVIDER_SITE_OTHER): Payer: Medicare Other | Admitting: Physician Assistant

## 2021-02-10 ENCOUNTER — Encounter: Payer: Self-pay | Admitting: Physician Assistant

## 2021-02-10 VITALS — BP 111/69 | HR 98 | Temp 98.4°F | Wt 128.0 lb

## 2021-02-10 DIAGNOSIS — Z79899 Other long term (current) drug therapy: Secondary | ICD-10-CM

## 2021-02-10 DIAGNOSIS — Z780 Asymptomatic menopausal state: Secondary | ICD-10-CM

## 2021-02-10 DIAGNOSIS — R29898 Other symptoms and signs involving the musculoskeletal system: Secondary | ICD-10-CM | POA: Diagnosis not present

## 2021-02-10 DIAGNOSIS — E559 Vitamin D deficiency, unspecified: Secondary | ICD-10-CM | POA: Diagnosis not present

## 2021-02-10 NOTE — Patient Instructions (Signed)
Weakness Weakness is a lack of strength. You may feel weak all over your body (generalized), or you may feel weak in one specific part of your body (focal). Common causes of weakness include:  Infection and immune system disorders.  Physical exhaustion.  Internal bleeding or other blood loss that results in a lack of red blood cells (anemia).  Dehydration.  An imbalance in mineral (electrolyte) levels, such as potassium.  Heart disease, circulation problems, or stroke. Other causes include:  Some medicines or cancer treatment.  Stress, anxiety, or depression.  Nervous system disorders.  Thyroid disorders.  Loss of muscle strength because of age or inactivity.  Poor sleep quality or sleep disorders. The cause of your weakness may not be known. Some causes of weakness can be serious, so it is important to see your health care provider. Follow these instructions at home: Activity  Rest as needed.  Try to get enough sleep. Most adults need 7-8 hours of quality sleep each night. Talk to your health care provider about how much sleep you need each night.  Do exercises, such as arm curls and leg raises, for 30 minutes at least 2 days a week or as told by your health care provider. This helps build muscle strength.  Consider working with a physical therapist or trainer who can develop an exercise plan to help you gain muscle strength. General instructions  Take over-the-counter and prescription medicines only as told by your health care provider.  Eat a healthy, well-balanced diet. This includes: ? Proteins to build muscles, such as lean meats and fish. ? Fresh fruits and vegetables. ? Carbohydrates to boost energy, such as whole grains.  Drink enough fluid to keep your urine pale yellow.  Keep all follow-up visits as told by your health care provider. This is important.   Contact a health care provider if your weakness:  Does not improve or gets worse.  Affects your  ability to think clearly.  Affects your ability to do your normal daily activities. Get help right away if you:  Develop sudden weakness, especially on one side of your face or body.  Have chest pain.  Have trouble breathing or shortness of breath.  Have problems with your vision.  Have trouble talking or swallowing.  Have trouble standing or walking.  Are light-headed or lose consciousness. Summary  Weakness is a lack of strength. You may feel weak all over your body or just in one specific part of your body.  Weakness can be caused by a variety of things. In some cases, the cause may be unknown.  Rest as needed, and try to get enough sleep. Most adults need 7-8 hours of quality sleep each night.  Eat a healthy, well-balanced diet. This information is not intended to replace advice given to you by your health care provider. Make sure you discuss any questions you have with your health care provider. Document Revised: 07/12/2018 Document Reviewed: 07/12/2018 Elsevier Patient Education  2021 Elsevier Inc.  

## 2021-02-10 NOTE — Progress Notes (Signed)
Established patient visit   Patient: Holly Matthews   DOB: January 22, 1951   70 y.o. Female  MRN: [id number] Visit Date: 02/10/2021  Today's healthcare provider: Mar Daring, PA-C   Chief Complaint  Patient presents with  . Weakness   Subjective    HPI   Pt comes in today complaining of muscle weakness in her legs and occasionally her arms.  She states both her legs just feel "weak".  She denies numbness, tingling, pain, swelling.  She also states she has not fallen.  The weakness started several months ago and has not changed. She reports no injuries recently. The weakness is usually worse in the mornings and also when she is sitting too long.  Pt states she has been on a PPI for a long time and is concern she could be B12 deficient.   Patient Active Problem List   Diagnosis Date Noted  . Screening-pulmonary TB 07/15/2020  . Near syncope 10/11/2018  . Heart palpitations 10/11/2018  . Dyspepsia 09/05/2018  . Allergic rhinitis 05/06/2015  . Acid reflux 05/06/2015  . History of colon polyps 05/06/2015  . H/O renal calculi 05/06/2015  . Hypercholesteremia 05/06/2015  . Irregular cardiac rhythm 05/06/2015  . Diabetes mellitus, type 2 ([place]) 05/06/2015  . Adenomatous polyp 05/06/2015   Past Medical History:  Diagnosis Date  . Allergy   . Diabetes mellitus without complication ([place])    Diet controlled  . GERD (gastroesophageal reflux disease)   . Hyperlipidemia   . Serum cholesterol elevated    Social History   Tobacco Use  . Smoking status: Never Smoker  . Smokeless tobacco: Never Used  Vaping Use  . Vaping Use: Never used  Substance Use Topics  . Alcohol use: No  . Drug use: No   Allergies  Allergen Reactions  . Codeine      Medications: Outpatient Medications Prior to Visit  Medication Sig  . atorvastatin (LIPITOR) 10 MG tablet TAKE 1 TABLET BY MOUTH  DAILY  . calcium-vitamin D (OSCAL WITH D) 500-200 MG-UNIT TABS tablet Take 2 tablets by mouth daily.    . pantoprazole (PROTONIX) 40 MG tablet TAKE 1 TABLET BY MOUTH  TWICE DAILY   No facility-administered medications prior to visit.    Review of Systems  Constitutional: Negative.   Respiratory: Negative.   Cardiovascular: Negative.   Gastrointestinal: Negative.   Musculoskeletal: Negative.   Neurological: Positive for dizziness (History of vertigo; pt states this is stable.) and weakness. Negative for speech difficulty, light-headedness, numbness and headaches.        Objective    BP 111/69 (BP Location: Left Arm, Patient Position: Sitting, Cuff Size: Normal)   Pulse 98   Temp 98.4 F (36.9 C) (Oral)   Wt 128 lb (58.1 kg)   BMI 25.00 kg/m    Physical Exam Vitals reviewed.  Constitutional:      General: She is not in acute distress.    Appearance: Normal appearance. She is well-developed and well-nourished. She is not ill-appearing or diaphoretic.  Neck:     Thyroid: No thyromegaly.     Vascular: No JVD.     Trachea: No tracheal deviation.  Cardiovascular:     Rate and Rhythm: Normal rate and regular rhythm.     Heart sounds: Normal heart sounds. No murmur heard. No friction rub. No gallop.   Pulmonary:     Effort: Pulmonary effort is normal. No respiratory distress.     Breath sounds: Normal breath sounds. No  wheezing or rales.  Musculoskeletal:     Cervical back: Normal range of motion and neck supple.  Lymphadenopathy:     Cervical: No cervical adenopathy.  Skin:    Capillary Refill: Capillary refill takes less than 2 seconds.  Neurological:     General: No focal deficit present.     Mental Status: She is alert. Mental status is at baseline.      No results found for any visits on 02/10/21.  Assessment & Plan     1. Weakness of both lower extremities Unknown source. Weakness is most prominent in mornings. Not focal to one area. Not causing falls. Will check labs as below and f/u pending results. - ANA,IFA RA Diag Pnl w/rflx Tit/Patn - Sed Rate  (ESR) - C-reactive protein - B12 - Vitamin D (25 hydroxy)  2. High risk medication use Chronic PPI use. Possible vit def. Will check labs as below and f/u pending results. - B12 - Vitamin D (25 hydroxy)  3. 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 Will check labs as below and f/u pending results. - Vitamin D (25 hydroxy)   No follow-ups on file.      I, Jennifer M Burnette, PA-C, have reviewed all documentation for this visit. The documentation on 02/10/21 for the exam, diagnosis, procedures, and orders are all accurate and complete.   Jennifer M Burnette, PA-C  [facility] [phone] (phone) [phone] (fax)  [facility]  

## 2021-02-12 ENCOUNTER — Encounter: Payer: Self-pay | Admitting: *Deleted

## 2021-02-12 LAB — ANA,IFA RA DIAG PNL W/RFLX TIT/PATN
ANA Titer 1: NEGATIVE
Cyclic Citrullin Peptide Ab: 8 units (ref 0–19)
Rheumatoid fact SerPl-aCnc: <10 IU/mL (ref <14.0)

## 2021-02-12 LAB — C-REACTIVE PROTEIN: CRP: <1 mg/L (ref 0–10)

## 2021-02-12 LAB — VITAMIN D 25 HYDROXY (VIT D DEFICIENCY, FRACTURES): Vit D, 25-Hydroxy: 26.1 ng/mL — ABNORMAL LOW (ref 30.0–100.0)

## 2021-02-12 LAB — SEDIMENTATION RATE: Sed Rate: 13 mm/hr (ref 0–40)

## 2021-02-12 LAB — VITAMIN B12: Vitamin B-12: 632 pg/mL (ref 232–1245)

## 2021-02-15 ENCOUNTER — Encounter: Payer: Self-pay | Admitting: Physician Assistant

## 2021-02-15 LAB — POCT UA - MICROALBUMIN: Microalbumin Ur, POC: NEGATIVE mg/L

## 2021-04-02 ENCOUNTER — Other Ambulatory Visit: Payer: Self-pay | Admitting: Physician Assistant

## 2021-04-02 DIAGNOSIS — E7800 Pure hypercholesterolemia, unspecified: Secondary | ICD-10-CM

## 2021-04-02 NOTE — Telephone Encounter (Signed)
Requested Prescriptions  Pending Prescriptions Disp Refills  . atorvastatin (LIPITOR) 10 MG tablet [Pharmacy Med Name: Atorvastatin Calcium 10 MG Oral Tablet] 90 tablet 3    Sig: TAKE 1 TABLET BY MOUTH  DAILY     Cardiovascular:  Antilipid - Statins Failed - 04/02/2021  5:15 AM      Failed - LDL in normal range and within 360 days    LDL Cholesterol (Calc)  Date Value Ref Range Status  12/14/2017 113 (H) mg/dL (calc) Final    Comment:    Reference range: <100 . Desirable range <100 mg/dL for primary prevention;   <70 mg/dL for patients with CHD or diabetic patients  with > or = 2 CHD risk factors. Marland Kitchen LDL-C is now calculated using the Martin-Hopkins  calculation, which is a validated novel method providing  better accuracy than the Friedewald equation in the  estimation of LDL-C.  Cresenciano Genre et al. Annamaria Helling. 1610;960(45): 2061-2068  (http://education.[employer].com/faq/FAQ164)    LDL Chol Calc (NIH)  Date Value Ref Range Status  02/02/2021 108 (H) 0 - 99 mg/dL Final         Passed - Total Cholesterol in normal range and within 360 days    Cholesterol, Total  Date Value Ref Range Status  02/02/2021 186 100 - 199 mg/dL Final         Passed - HDL in normal range and within 360 days    HDL  Date Value Ref Range Status  02/02/2021 57 >39 mg/dL Final         Passed - Triglycerides in normal range and within 360 days    Triglycerides  Date Value Ref Range Status  02/02/2021 119 0 - 149 mg/dL Final         Passed - Patient is not pregnant      Passed - Valid encounter within last 12 months    Recent Outpatient Visits          1 month ago Weakness of both lower extremities   [place], Clearnce Sorrel, PA-C   1 month ago Medicare annual wellness visit, subsequent   [place], [place], [place]   4 months ago Pain in right lower leg   [place], Anderson [place] M, [place]   8 months ago Type 2 diabetes  mellitus without complication, without long-term current use of insulin [facility])   [place], Clearnce Sorrel, [place]   1 year ago Annual physical exam   [facility] [place], [place], [place]

## 2021-04-14 ENCOUNTER — Encounter: Payer: Self-pay | Admitting: Family Medicine

## 2021-04-14 DIAGNOSIS — E559 Vitamin D deficiency, unspecified: Secondary | ICD-10-CM | POA: Insufficient documentation

## 2021-04-30 DIAGNOSIS — L821 Other seborrheic keratosis: Secondary | ICD-10-CM | POA: Diagnosis not present

## 2021-07-17 ENCOUNTER — Other Ambulatory Visit: Payer: Self-pay

## 2021-07-17 ENCOUNTER — Ambulatory Visit (INDEPENDENT_AMBULATORY_CARE_PROVIDER_SITE_OTHER): Payer: Medicare Other | Admitting: Family Medicine

## 2021-07-17 ENCOUNTER — Encounter: Payer: Self-pay | Admitting: Family Medicine

## 2021-07-17 VITALS — BP 107/62 | HR 75 | Temp 98.4°F | Resp 16 | Ht 60.0 in | Wt 123.0 lb

## 2021-07-17 DIAGNOSIS — E559 Vitamin D deficiency, unspecified: Secondary | ICD-10-CM | POA: Diagnosis not present

## 2021-07-17 DIAGNOSIS — K219 Gastro-esophageal reflux disease without esophagitis: Secondary | ICD-10-CM | POA: Diagnosis not present

## 2021-07-17 DIAGNOSIS — E119 Type 2 diabetes mellitus without complications: Secondary | ICD-10-CM | POA: Diagnosis not present

## 2021-07-17 DIAGNOSIS — R29898 Other symptoms and signs involving the musculoskeletal system: Secondary | ICD-10-CM | POA: Diagnosis not present

## 2021-07-17 DIAGNOSIS — E7800 Pure hypercholesterolemia, unspecified: Secondary | ICD-10-CM

## 2021-07-17 LAB — POCT GLYCOSYLATED HEMOGLOBIN (HGB A1C): Hemoglobin A1C: 7.2 % — AB (ref 4.0–5.6)

## 2021-07-17 NOTE — Progress Notes (Signed)
Established patient visit   Patient: Holly Matthews   DOB: 03-05-1951   70 y.o. Female  MRN: [id number] Visit Date: 07/17/2021  Today's healthcare provider: Gwyneth Sprout, FNP   Chief Complaint  Patient presents with   Diabetes    Subjective    HPI  Diabetes Mellitus Type II, Follow-up  Lab Results  Component Value Date   HGBA1C 7.2 (A) 07/17/2021   HGBA1C 7.4 (H) 02/02/2021   HGBA1C 7.4 (A) 07/11/2020   Wt Readings from Last 3 Encounters:  07/17/21 123 lb (55.8 kg)  02/10/21 128 lb (58.1 kg)  02/02/21 126 lb (57.2 kg)   Last seen for diabetes 6 months ago.  Management since then includes no medication changes. She reports good compliance with treatment. She is not having side effects.   Symptoms: No fatigue No foot ulcerations  No appetite changes No nausea  No paresthesia of the feet  No polydipsia  No polyuria No visual disturbances   No vomiting     Home blood sugar records:  not being checked  Episodes of hypoglycemia? No Pt provided with education on hypoglycemia given fearfulness of low BG levels given dx of previous spouse as well as father   Current insulin regiment: none Most Recent Eye Exam: up to date Current exercise: no regular exercise Current diet habits: well balanced 24 diet recall done- lacking protein sources and fiber 7/28 cereal/milk, water; peas/potato/cornbread, water; BLT sandwich, diet soda  Pertinent Labs: Lab Results  Component Value Date   CHOL 186 02/02/2021   HDL 57 02/02/2021   LDLCALC 108 (H) 02/02/2021   TRIG 119 02/02/2021   CHOLHDL 3.3 12/12/2019   Lab Results  Component Value Date   NA 140 02/02/2021   K 4.2 02/02/2021   CREATININE 0.99 02/02/2021   GFRNONAA 58 (L) 02/02/2021   GFRAA 67 02/02/2021   GLUCOSE 128 (H) 02/02/2021     --------------------------------------------------------------------------------------------------- Lipid/Cholesterol, Follow-up  Last lipid panel Other pertinent labs  Lab  Results  Component Value Date   CHOL 186 02/02/2021   HDL 57 02/02/2021   LDLCALC 108 (H) 02/02/2021   TRIG 119 02/02/2021   CHOLHDL 3.3 12/12/2019   Lab Results  Component Value Date   ALT 15 02/02/2021   AST 17 02/02/2021   PLT 206 02/02/2021   TSH 1.620 02/02/2021     She was last seen for this 6 months ago.  Management since that visit includes no medication changes.  She reports good compliance with treatment. She is not having side effects.   Symptoms: No chest pain No chest pressure/discomfort  No dyspnea No lower extremity edema  No numbness or tingling of extremity No orthopnea  No palpitations No paroxysmal nocturnal dyspnea  No speech difficulty No syncope   Current diet: well balanced Current exercise: no regular exercise  The 10-year ASCVD risk score Mikey Bussing DC Jr., et al., 2013) is: 12.4%  ---------------------------------------------------------------------------------------------------     Medications: Outpatient Medications Prior to Visit  Medication Sig   atorvastatin (LIPITOR) 10 MG tablet TAKE 1 TABLET BY MOUTH  DAILY   calcium-vitamin D (OSCAL WITH D) 500-200 MG-UNIT TABS tablet Take 1 tablet by mouth in the morning and at bedtime.   pantoprazole (PROTONIX) 40 MG tablet TAKE 1 TABLET BY MOUTH  TWICE DAILY   [DISCONTINUED] calcium-vitamin D (OSCAL WITH D) 500-200 MG-UNIT TABS tablet Take 2 tablets by mouth daily.    No facility-administered medications prior to visit.    Review of  Systems  Constitutional:  Negative for activity change and appetite change.  Gastrointestinal:  Negative for abdominal pain.  Neurological:  Negative for dizziness, weakness and numbness.   {Labs  Heme  Chem  Endocrine  Serology  Results Review (optional):23779}   Objective    BP 107/62   Pulse 75   Temp 98.4 F (36.9 C)   Resp 16   Ht 5' (1.524 m)   Wt 123 lb (55.8 kg)   BMI 24.02 kg/m     Physical Exam Vitals and nursing note reviewed.   Cardiovascular:     Rate and Rhythm: Normal rate and regular rhythm.     Pulses: Normal pulses.     Heart sounds: Normal heart sounds. No murmur heard.   No friction rub. No gallop.  Pulmonary:     Effort: Pulmonary effort is normal.     Breath sounds: Normal breath sounds.  Abdominal:     Palpations: Abdomen is soft.  Musculoskeletal:        General: Normal range of motion.  Skin:    General: Skin is warm and dry.  Neurological:     Mental Status: She is alert.     Results for orders placed or performed in visit on 07/17/21  POCT glycosylated hemoglobin (Hb A1C)  Result Value Ref Range   Hemoglobin A1C 7.2 (A) 4.0 - 5.6 %   HbA1c POC (<> result, manual entry)     HbA1c, POC (prediabetic range)     HbA1c, POC (controlled diabetic range)      Assessment & Plan     Problem List Items Addressed This Visit       Digestive   Acid reflux    Stable on PPI Recommend low acid, low fat meals to avoid exacerbation Pt doing well with drinking water and erosive beverages Continue medication         Endocrine   Type 2 diabetes mellitus without complication, without long-term current use of insulin (HCC) - Primary    Pt hesitant to start metformin, fearful of hypoglycemia Believes that she has worked hard on eating smaller portions, and less overall Does not consume a large amount of calories in drinks alone A1c today, improved- still indicated DM Discussed frequency of meals and adding exercise or movement following larger meals or meals higher in carbohydrates Discussed need for protein and fiber in addition to fat and carb monitoring when looking at food labels       Relevant Orders   POCT glycosylated hemoglobin (Hb A1C) (Completed)     Nervous and Auditory   Weakness of both lower extremities    Resolved Pt now more active than previously 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 Previous workup benign, with exception of vit d Pt has not been  talking Ca and Vit D supplement, discussed importance of restarting           Other   Hypercholesteremia    Stable on labs 5 months ago Continue statin No complications/side effects recommend diet low in saturated fat and regular exercise - 30 min at least 5 times per week        Vitamin D deficiency    Refused lab check today given not taking supplemental or calcium/vit d supplement Discussed importance of starting again Plan to recheck at following visit or CPE No weakness or fatigue endorsed        Return in about 3 months (around 10/17/2021) for T2DM management.  Vonna Kotyk, FNP, have reviewed all documentation for this visit. The documentation on 07/17/21 for the exam, diagnosis, procedures, and orders are all accurate and complete.  Gwyneth Sprout, [place] [phone] (phone) [phone] (fax)  [place]

## 2021-07-17 NOTE — Assessment & Plan Note (Signed)
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

## 2021-07-17 NOTE — Assessment & Plan Note (Signed)
Stable on PPI Recommend low acid, low fat meals to avoid exacerbation Pt doing well with drinking water and erosive beverages Continue medication

## 2021-07-17 NOTE — Assessment & Plan Note (Signed)
Refused lab check today given not taking supplemental or calcium/vit d supplement Discussed importance of starting again Plan to recheck at following visit or CPE No weakness or fatigue endorsed

## 2021-07-17 NOTE — Assessment & Plan Note (Signed)
Stable on labs 5 months ago Continue statin No complications/side effects recommend diet low in saturated fat and regular exercise - 30 min at least 5 times per week

## 2021-07-17 NOTE — Assessment & Plan Note (Signed)
Pt hesitant to start metformin, fearful of hypoglycemia Believes that she has worked hard on eating smaller portions, and less overall Does not consume a large amount of calories in drinks alone A1c today, improved- still indicated DM Discussed frequency of meals and adding exercise or movement following larger meals or meals higher in carbohydrates Discussed need for protein and fiber in addition to fat and carb monitoring when looking at food labels

## 2021-09-09 ENCOUNTER — Other Ambulatory Visit: Payer: Self-pay | Admitting: Physician Assistant

## 2021-10-20 ENCOUNTER — Ambulatory Visit: Payer: Self-pay | Admitting: Family Medicine

## 2021-10-20 DIAGNOSIS — H2513 Age-related nuclear cataract, bilateral: Secondary | ICD-10-CM | POA: Diagnosis not present

## 2021-10-20 LAB — HM DIABETES EYE EXAM

## 2021-12-18 ENCOUNTER — Ambulatory Visit (INDEPENDENT_AMBULATORY_CARE_PROVIDER_SITE_OTHER): Payer: Medicare Other | Admitting: Physician Assistant

## 2021-12-18 ENCOUNTER — Other Ambulatory Visit: Payer: Self-pay

## 2021-12-18 ENCOUNTER — Encounter: Payer: Self-pay | Admitting: Physician Assistant

## 2021-12-18 VITALS — BP 103/65 | HR 80 | Temp 98.5°F | Resp 16 | Ht 60.0 in | Wt 126.0 lb

## 2021-12-18 DIAGNOSIS — M7989 Other specified soft tissue disorders: Secondary | ICD-10-CM | POA: Diagnosis not present

## 2021-12-18 NOTE — Progress Notes (Signed)
Established patient visit   Patient: Holly Matthews   DOB: 30-Oct-1951   70 y.o. Female  MRN: [id number] Visit Date: 12/18/2021  Today's healthcare provider: Dani Gobble Zachary Nole, PA-C   Introduced myself to the patient as a Journalist, newspaper and provided education on APPs in clinical practice.    Chief Complaint  Patient presents with   Cyst   Subjective    HPI  Patient is concerning a lump/cyst on her back. Lump/cyst is located in middle on left side of spine and slightly bigger than a golf ball. Patient states she just recently discovered the lump. Patient states lump has started to become uncomfortable with certain positions.     States she is unsure when lump developed States she has mild pain/irritation when prolonged pressure is place on area- like leaning against the back of a chair. Denies redness, swelling, drainage, numbness, fevers Denies being bitten in area or trauma Denies pain in surrounding area of back or chest Unable to confirm size changes  Initially thought it was a knot in muscle or "fat roll"  Thinks she took true note of lump roughly 3 weeks ago    Medications: Outpatient Medications Prior to Visit  Medication Sig   atorvastatin (LIPITOR) 10 MG tablet TAKE 1 TABLET BY MOUTH  DAILY   calcium-vitamin D (OSCAL WITH D) 500-200 MG-UNIT TABS tablet Take 1 tablet by mouth in the morning and at bedtime.   pantoprazole (PROTONIX) 40 MG tablet TAKE 1 TABLET BY MOUTH  TWICE DAILY   No facility-administered medications prior to visit.    Review of Systems  Constitutional:  Negative for appetite change, chills, fatigue and fever.  Respiratory:  Negative for chest tightness and shortness of breath.   Cardiovascular:  Negative for chest pain and palpitations.  Gastrointestinal:  Negative for abdominal pain, nausea and vomiting.  Musculoskeletal:  Negative for arthralgias and myalgias.  Skin:        Lump/ cyst on the left side of back Mild intermittent pain/  tenderness   Neurological:  Negative for dizziness and weakness.      Objective    BP 103/65 (BP Location: Left Arm, Patient Position: Sitting, Cuff Size: Normal)    Pulse 80    Temp 98.5 F (36.9 C) (Temporal)    Resp 16    Ht 5' (1.524 m)    Wt 126 lb (57.2 kg)    SpO2 96%    BMI 24.61 kg/m  {Show previous vital signs (optional):23777}  Physical Exam Constitutional:      Appearance: Normal appearance. She is normal weight.  Musculoskeletal:     Cervical back: Normal range of motion and neck supple.  Skin:         Comments: 4cm wide and approx 4.5-5cm in vertical length with some extension to middle of back  Palpation indicates lesion  is deep and may be mildly mobile in inferior aspect   Neurological:     Mental Status: She is alert.     Motor: Motor function is intact.     Coordination: Coordination is intact.     Gait: Gait is intact.      No results found for any visits on 12/18/21.  Assessment & Plan     Problem List Items Addressed This Visit       Other   Mass of soft tissue - Primary    Acute, ongoing concern for soft tissue mass on left side of thoracic area of  back Referred for [place] of area to discern character and potential differentiate between lipoma, cyst or other soft tissue mass.  Recommend follow up with dermatology to help guide evaluation and provide potential management strategy Follow up as dictated by [place] results       Relevant Orders   [place] CHEST SOFT TISSUE     No follow-ups on file.   The entirety of the information documented in the History of Present Illness, Review of Systems and Physical Exam were personally obtained by me. Portions of this information were initially documented by the CMA, [name], and reviewed by me for thoroughness and accuracy.   I, Holly Hidrogo E Mazy Culton, PA-C, have reviewed all documentation for this visit. The documentation on 12/18/21 for the exam, diagnosis, procedures, and orders are all accurate and  complete.   Holly Matthews, Holly Matthews MPH [place], PA-C  [facility] [phone] (phone) [phone] (fax)  [place]

## 2021-12-18 NOTE — Assessment & Plan Note (Signed)
Acute, ongoing concern for soft tissue mass on left side of thoracic area of back Referred for [place] of area to discern character and potential differentiate between lipoma, cyst or other soft tissue mass.  Recommend follow up with dermatology to help guide evaluation and provide potential management strategy Follow up as dictated by [place] results

## 2021-12-18 NOTE — Patient Instructions (Addendum)
Based on your exam today, I think it is appropriate to schedule follow up imaging (an ultrasound of the mass on your back) to determine it's character and dimensions  We will follow up with you once we get the results of the ultrasound   I think a follow up with your dermatologist is also wise so they can examine the area as well and if it is a benign mass they can help advise you on best management options.   It was nice to meet you and I appreciate the opportunity to be involved in your care

## 2021-12-23 ENCOUNTER — Ambulatory Visit
Admission: RE | Admit: 2021-12-23 | Discharge: 2021-12-23 | Disposition: A | Discharge status: Home / Self Care | Payer: Medicare Other | Source: Ambulatory Visit | Attending: Physician Assistant | Admitting: Physician Assistant

## 2021-12-23 ENCOUNTER — Other Ambulatory Visit: Payer: Self-pay

## 2021-12-23 DIAGNOSIS — R222 Localized swelling, mass and lump, trunk: Secondary | ICD-10-CM | POA: Diagnosis not present

## 2021-12-23 DIAGNOSIS — M7989 Other specified soft tissue disorders: Secondary | ICD-10-CM | POA: Diagnosis not present

## 2021-12-28 DIAGNOSIS — L821 Other seborrheic keratosis: Secondary | ICD-10-CM | POA: Diagnosis not present

## 2021-12-28 DIAGNOSIS — D171 Benign lipomatous neoplasm of skin and subcutaneous tissue of trunk: Secondary | ICD-10-CM | POA: Diagnosis not present

## 2022-01-01 ENCOUNTER — Telehealth: Payer: Medicare Other | Admitting: Physician Assistant

## 2022-01-01 DIAGNOSIS — H109 Unspecified conjunctivitis: Secondary | ICD-10-CM | POA: Diagnosis not present

## 2022-01-01 MED ORDER — POLYMYXIN B-TRIMETHOPRIM 10000-0.1 UNIT/ML-% OP SOLN: 1.0000 [drp] | OPHTHALMIC | 0 refills | Status: DC

## 2022-01-01 NOTE — Progress Notes (Signed)
E-Visit for [employer]   We are sorry that you are not feeling well.  Here is how we plan to help!  Based on what you have shared with me it looks like you have conjunctivitis.  Conjunctivitis is a common inflammatory or infectious condition of the eye that is often referred to as "pink eye".  In most cases it is contagious (viral or bacterial). However, not all conjunctivitis requires antibiotics (ex. Allergic).  We have made appropriate suggestions for you based upon your presentation.  I have prescribed Polytrim Ophthalmic drops 1 drop every 4 hours while awake for 3-5 days  Pink eye can be highly contagious.  It is typically spread through direct contact with secretions, or contaminated objects or surfaces that one may have touched.  Strict handwashing is suggested with soap and water is urged.  If not available, use alcohol based had sanitizer.  Avoid unnecessary touching of the eye.  If you wear contact lenses, you will need to refrain from wearing them until you see no white discharge from the eye for at least 24 hours after being on medication.  You should see symptom improvement in 1-2 days after starting the medication regimen.  Call us if symptoms are not improved in 1-2 days.  Home Care: Wash your hands often! Do not wear your contacts until you complete your treatment plan. Avoid sharing towels, bed linen, personal items with a person who has pink eye. See attention for anyone in your home with similar symptoms.  Get Help Right Away If: Your symptoms do not improve. You develop blurred or loss of vision. Your symptoms worsen (increased discharge, pain or redness)   Thank you for choosing an e-visit.  Your e-visit answers were reviewed by a board certified advanced clinical practitioner to complete your personal care plan. Depending upon the condition, your plan could have included both over the counter or prescription medications.  Please review your pharmacy choice. Make sure  the pharmacy is open so you can pick up prescription now. If there is a problem, you may contact your provider through [employer] and have the prescription routed to another pharmacy.  Your safety is important to [place]. If you have drug allergies check your prescription carefully.   For the next 24 hours you can use MyChart to ask questions about today's visit, request a non-urgent call back, or ask for a work or school excuse. You will get an email in the next two days asking about your experience. I hope that your e-visit has been valuable and will speed your recovery.  I provided 5 minutes of non face-to-face time during this encounter for chart review and documentation.

## 2022-02-13 ENCOUNTER — Other Ambulatory Visit: Payer: Self-pay | Admitting: Family Medicine

## 2022-02-13 DIAGNOSIS — E7800 Pure hypercholesterolemia, unspecified: Secondary | ICD-10-CM

## 2022-02-22 ENCOUNTER — Encounter: Payer: Self-pay | Admitting: Family Medicine

## 2022-02-22 ENCOUNTER — Inpatient Hospital Stay (INDEPENDENT_AMBULATORY_CARE_PROVIDER_SITE_OTHER): Payer: Medicare Other

## 2022-02-22 ENCOUNTER — Other Ambulatory Visit: Payer: Self-pay

## 2022-02-22 ENCOUNTER — Ambulatory Visit (INDEPENDENT_AMBULATORY_CARE_PROVIDER_SITE_OTHER): Payer: Medicare Other | Admitting: Family Medicine

## 2022-02-22 VITALS — BP 121/62 | HR 75 | Temp 98.7°F | Ht 60.0 in | Wt 137.0 lb

## 2022-02-22 DIAGNOSIS — M810 Age-related osteoporosis without current pathological fracture: Secondary | ICD-10-CM | POA: Diagnosis not present

## 2022-02-22 DIAGNOSIS — E1165 Type 2 diabetes mellitus with hyperglycemia: Secondary | ICD-10-CM | POA: Diagnosis not present

## 2022-02-22 DIAGNOSIS — R000 Tachycardia, unspecified: Secondary | ICD-10-CM

## 2022-02-22 DIAGNOSIS — K2100 Gastro-esophageal reflux disease with esophagitis, without bleeding: Secondary | ICD-10-CM | POA: Diagnosis not present

## 2022-02-22 DIAGNOSIS — Z0000 Encounter for general adult medical examination without abnormal findings: Secondary | ICD-10-CM | POA: Diagnosis not present

## 2022-02-22 DIAGNOSIS — Z1231 Encounter for screening mammogram for malignant neoplasm of breast: Secondary | ICD-10-CM | POA: Diagnosis not present

## 2022-02-22 DIAGNOSIS — E7800 Pure hypercholesterolemia, unspecified: Secondary | ICD-10-CM | POA: Diagnosis not present

## 2022-02-22 DIAGNOSIS — E1122 Type 2 diabetes mellitus with diabetic chronic kidney disease: Secondary | ICD-10-CM | POA: Insufficient documentation

## 2022-02-22 NOTE — Progress Notes (Signed)
[place],acting as a scribe for Gwyneth Sprout, FNP.,have documented all relevant documentation on the behalf of Gwyneth Sprout, FNP,as directed by  Gwyneth Sprout, FNP while in the presence of Gwyneth Sprout, FNP.   Annual Wellness Visit     Patient: Holly Matthews, Female    DOB: 06-02-1951, 71 y.o.   MRN: [id number] Visit Date: 02/22/2022  Today's Provider: Gwyneth Sprout, FNP  Re-Introduced to nurse practitioner role and practice setting.  All questions answered.  Discussed provider/patient relationship and expectations.   No chief complaint on file.  Subjective    Holly Matthews is a 71 y.o. female who presents today for her Annual Wellness Visit. She reports consuming a general diet. The patient does not participate in regular exercise at present. She generally feels well. She reports sleeping well. She does have additional problems to discuss today.   HPI    Medications: Outpatient Medications Prior to Visit  Medication Sig   atorvastatin (LIPITOR) 10 MG tablet TAKE 1 TABLET BY MOUTH  DAILY   calcium-vitamin D (OSCAL WITH D) 500-200 MG-UNIT TABS tablet Take 1 tablet by mouth in the morning and at bedtime.   pantoprazole (PROTONIX) 40 MG tablet TAKE 1 TABLET BY MOUTH  TWICE DAILY   [DISCONTINUED] trimethoprim-polymyxin b (POLYTRIM) ophthalmic solution Place 1 drop into the left eye every 4 (four) hours. X 3-5 days   No facility-administered medications prior to visit.    Allergies  Allergen Reactions   Codeine     Patient Care Team: Gwyneth Sprout, FNP as PCP - General (Family Medicine)  Review of Systems  Constitutional: Negative.   HENT: Negative.    Eyes: Negative.   Respiratory: Negative.    Cardiovascular: Negative.   Gastrointestinal: Negative.   Endocrine: Negative.   Genitourinary: Negative.   Musculoskeletal: Negative.   Skin: Negative.   Allergic/Immunologic: Negative.   Neurological: Negative.   Hematological: Negative.    Psychiatric/Behavioral: Negative.          Objective    Vitals: BP 121/62 (BP Location: Right Arm, Patient Position: Sitting, Cuff Size: Normal)    Pulse 75    Temp 98.7 F (37.1 C) (Oral)    Ht 5' (1.524 m)    Wt 137 lb (62.1 kg)    SpO2 99%    BMI 26.76 kg/m      Physical Exam Vitals and nursing note reviewed.  Constitutional:      General: She is awake. She is not in acute distress.    Appearance: Normal appearance. She is well-developed, well-groomed and overweight. She is not ill-appearing, toxic-appearing or diaphoretic.  HENT:     Head: Normocephalic and atraumatic.     Jaw: There is normal jaw occlusion. No trismus, tenderness, swelling or pain on movement.     Right Ear: Hearing, tympanic membrane, ear canal and external ear normal. There is no impacted cerumen.     Left Ear: Hearing, tympanic membrane, ear canal and external ear normal. There is no impacted cerumen.     Nose: Nose normal. No congestion or rhinorrhea.     Right Turbinates: Not enlarged, swollen or pale.     Left Turbinates: Not enlarged, swollen or pale.     Right Sinus: No maxillary sinus tenderness or frontal sinus tenderness.     Left Sinus: No maxillary sinus tenderness or frontal sinus tenderness.     Mouth/Throat:     Lips: Pink.     Mouth: Mucous  membranes are moist. No injury.     Tongue: No lesions.     Pharynx: Oropharynx is clear. Uvula midline. No pharyngeal swelling, oropharyngeal exudate, posterior oropharyngeal erythema or uvula swelling.     Tonsils: No tonsillar exudate or tonsillar abscesses.  Eyes:     General: Lids are normal. Lids are everted, no foreign bodies appreciated. Vision grossly intact. Gaze aligned appropriately. No allergic shiner or visual field deficit.       Right eye: No discharge.        Left eye: No discharge.     Extraocular Movements: Extraocular movements intact.     Conjunctiva/sclera: Conjunctivae normal.     Right eye: Right conjunctiva is not injected. No  exudate.    Left eye: Left conjunctiva is not injected. No exudate.    Pupils: Pupils are equal, round, and reactive to light.  Neck:     Thyroid: No thyroid mass, thyromegaly or thyroid tenderness.     Vascular: No carotid bruit.     Trachea: Trachea normal.  Cardiovascular:     Rate and Rhythm: Normal rate and regular rhythm.     Pulses: Normal pulses.          Carotid pulses are 2+ on the right side and 2+ on the left side.      Radial pulses are 2+ on the right side and 2+ on the left side.       Dorsalis pedis pulses are 2+ on the right side and 2+ on the left side.       Posterior tibial pulses are 2+ on the right side and 2+ on the left side.     Heart sounds: Normal heart sounds, S1 normal and S2 normal. No murmur heard.   No friction rub. No gallop.  Pulmonary:     Effort: Pulmonary effort is normal. No respiratory distress.     Breath sounds: Normal breath sounds and air entry. No stridor. No wheezing, rhonchi or rales.  Chest:     Chest wall: No tenderness.     Comments: Breasts: risk and benefit of breast self-exam was discussed  Abdominal:     General: Abdomen is flat. Bowel sounds are normal. There is no distension.     Palpations: Abdomen is soft. There is no mass.     Tenderness: There is no abdominal tenderness. There is no right CVA tenderness, left CVA tenderness, guarding or rebound.     Hernia: No hernia is present.  Genitourinary:    Comments: Exam deferred; denies complaints Musculoskeletal:        General: No swelling, tenderness, deformity or signs of injury. Normal range of motion.     Cervical back: Full passive range of motion without pain, normal range of motion and neck supple. No edema, rigidity or tenderness. No muscular tenderness.     Right lower leg: No edema.     Left lower leg: No edema.  Lymphadenopathy:     Cervical: No cervical adenopathy.     Right cervical: No superficial, deep or posterior cervical adenopathy.    Left cervical: No  superficial, deep or posterior cervical adenopathy.  Skin:    General: Skin is warm and dry.     Capillary Refill: Capillary refill takes less than 2 seconds.     Coloration: Skin is not jaundiced or pale.     Findings: No bruising, erythema, lesion or rash.  Neurological:     General: No focal deficit present.     Mental  Status: She is alert and oriented to person, place, and time. Mental status is at baseline.     GCS: GCS eye subscore is 4. GCS verbal subscore is 5. GCS motor subscore is 6.     Sensory: Sensation is intact. No sensory deficit.     Motor: Motor function is intact. No weakness.     Coordination: Coordination is intact. Coordination normal.     Gait: Gait is intact. Gait normal.  Psychiatric:        Attention and Perception: Attention and perception normal.        Mood and Affect: Mood and affect normal.        Speech: Speech normal.        Behavior: Behavior normal. Behavior is cooperative.        Thought Content: Thought content normal.        Cognition and Memory: Cognition and memory normal.        Judgment: Judgment normal.     Most recent functional status assessment: In your present state of health, do you have any difficulty performing the following activities: 02/22/2022  Hearing? N  Vision? N  Difficulty concentrating or making decisions? N  Walking or climbing stairs? N  Dressing or bathing? N  Doing errands, shopping? N  Some recent data might be hidden   Most recent fall risk assessment: Fall Risk  02/22/2022  Falls in the past year? 1  Number falls in past yr: 0  Injury with Fall? 0  Risk for fall due to : -  Follow up -    Most recent depression screenings: PHQ 2/9 Scores 02/22/2022 12/18/2021  PHQ - 2 Score 0 0  PHQ- 9 Score 2 0   Most recent cognitive screening: No flowsheet data found. Most recent Audit-C alcohol use screening Alcohol Use Disorder Test (AUDIT) 02/22/2022  1. How often do you have a drink containing alcohol? 0  2. How  many drinks containing alcohol do you have on a typical day when you are drinking? -  3. How often do you have six or more drinks on one occasion? -  AUDIT-C Score -  Alcohol Brief Interventions/Follow-up -   A score of 3 or more in women, and 4 or more in men indicates increased risk for alcohol abuse, EXCEPT if all of the points are from question 1   No results found for any visits on 02/22/22.  Assessment & Plan     Annual wellness visit done today including the all of the following: Reviewed patient's Family Medical History Reviewed and updated list of patient's medical providers Assessment of cognitive impairment was done Assessed patient's functional ability Established a written schedule for health screening [place] Completed and Reviewed  Exercise Activities and Dietary recommendations  Goals      Exercise 3x per week (30 min per time)     Recommend to start back exercising for 3 days a week for 30-45 minutes.         Immunization History  Administered Date(s) Administered   Influenza Split 10/25/2011   Influenza, High Dose Seasonal PF 11/24/2016, 11/25/2017   Influenza,inj,Quad PF,6+ Mos 12/06/2018, 10/03/2019   Moderna Sars-Covid-2 Vaccination 01/02/2020, 01/30/2020   Pneumococcal Conjugate-13 11/24/2016   Pneumococcal Polysaccharide-23 05/16/2018    Health Maintenance  Topic Date Due   TETANUS/TDAP  Never done   Zoster Vaccines- Shingrix (1 of 2) Never done   MAMMOGRAM  03/30/2019   FOOT EXAM  12/07/2019   COVID-19  Vaccine (3 - Moderna risk series) 02/27/2020   HEMOGLOBIN A1C  01/17/2022   URINE MICROALBUMIN  02/15/2022   INFLUENZA VACCINE  03/19/2022 (Originally 07/20/2021)   OPHTHALMOLOGY EXAM  10/20/2022   COLONOSCOPY (Pts 45-12yr Insurance coverage will need to be confirmed)  11/07/2023   Pneumonia Vaccine 71 Years old  Completed   DEXA SCAN  Completed   Hepatitis C Screening  Completed   HPV VACCINES  Aged Out      Discussed health benefits of physical activity, and encouraged her to engage in regular exercise appropriate for her age and condition.    Problem List Items Addressed This Visit       Digestive   Gastroesophageal reflux disease with esophagitis without hemorrhage     Endocrine   Type 2 diabetes mellitus with hyperglycemia, without long-term current use of insulin (HCC) - Primary    A1c previously well controlled;  Continue to recommend balanced, lower carb meals. Smaller meal size, adding snacks. Choosing water as drink of choice and increasing purposeful exercise. Recent weight gain, however, added stressors- winter holidays and birthday Repeat A1c and urine micro albumin Refuses DM foot exam      Relevant Orders   CBC with Differential/Platelet   Comprehensive metabolic panel   Hemoglobin A1c   Lipid Panel With LDL/HDL Ratio   TSH   Urine Microalbumin w/creat. ratio     Musculoskeletal and Integument   Age-related osteoporosis without current pathological fracture    2019 OP seen in spine and femur Repeat DEXA Has been on Vit D and Calcium Not exercising       Relevant Orders   DG Bone Density     Other   Annual physical exam    UTD on dental UTD on vision Things to do to keep yourself healthy  - Exercise at least 30-45 minutes a day, 3-4 days a week.  - Eat a low-fat diet with lots of fruits and vegetables, up to 7-9 servings per day.  - Seatbelts can save your life. Wear them always.  - Smoke detectors on every level of your home, check batteries every year.  - Eye Doctor - have an eye exam every 1-2 years  - Safe sex - if you may be exposed to STDs, use a condom.  - Alcohol -  If you drink, do it moderately, less than 2 drinks per day.  - [place] Choose someone to speak for you if you are not able.  - Depression is common in our stressful world.If you're feeling down or losing interest in things you normally enjoy, please come in  for a visit.  - Violence - If anyone is threatening or hurting you, please call immediately.        Encounter for screening mammogram for malignant neoplasm of breast    Reports normal breast screening exams at home Due for mammogram Order placed      Relevant Orders   MM 3D SCREEN BREAST BILATERAL   Hypercholesteremia    HLD - medications: lipitor 10 mg - compliance: good - medication SEs: none  The 10-year ASCVD risk score (Arnett DK, et al., 2019) is: 17.2%   Values used to calculate the score:     Age: 89years     Sex: Female     Is Non-Hispanic African American: No     Diabetic: Yes     Tobacco smoker: No     Systolic Blood Pressure: 1355mmHg  Is BP treated: No     HDL Cholesterol: 57 mg/dL     Total Cholesterol: 186 mg/dL  Chronic, elevated risk -discussed need to increase medication off of FLP today recommend diet low in saturated fat and regular exercise - 30 min at least 5 times per week        Medicare annual wellness visit, subsequent    Recent stress d/t loss of her dad Due for vaccines -covid, refuses -TDAP, advised come to office/UC if get cut  -shingles, previously had shingles and was waiting to be 'in the clear'- OK to get now -due for mammo--> ordered -due for dexa--> ordered, advised that gather evidence to check to see if OP is worsening given use of bisphosphates  -dm foot exam- refused, educated--> denies complaints      Tachycardia    Pt report of elevated heart rate with associated "GERD" into chest Has been on PPI and taking alka seltzer Recommend Pepcid AC in addition to PPI in place of alka seltzer Hone in on GERD triggers Refused EKG in office Agreeable to Zio patch at home; 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     Vonna Kotyk, FNP, have reviewed all documentation for this visit. The documentation on 02/22/22 for the exam, diagnosis, procedures, and orders are all accurate and complete.    Gwyneth Sprout, [place] [phone] (phone) [phone] (fax)  [place]

## 2022-02-22 NOTE — Assessment & Plan Note (Signed)
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

## 2022-02-22 NOTE — Assessment & Plan Note (Signed)
Reports normal breast screening exams at home ?Due for mammogram ?Order placed ?

## 2022-02-22 NOTE — Assessment & Plan Note (Signed)
UTD on dental °UTD on vision °Things to do to keep yourself healthy  °- Exercise at least 30-45 minutes a day, 3-4 days a week.  °- Eat a low-fat diet with lots of fruits and vegetables, up to 7-9 servings per day.  °- Seatbelts can save your life. Wear them always.  °- Smoke detectors on every level of your home, check batteries every year.  °- Eye Doctor - have an eye exam every 1-2 years  °- Safe sex - if you may be exposed to STDs, use a condom.  °- Alcohol -  If you drink, do it moderately, less than 2 drinks per day.  °- Health Care Power of Attorney. Choose someone to speak for you if you are not able.  °- Depression is common in our stressful world.If you're feeling down or losing interest in things you normally enjoy, please come in for a visit.  °- Violence - If anyone is threatening or hurting you, please call immediately. ° ° °

## 2022-02-22 NOTE — Assessment & Plan Note (Signed)
2019 OP seen in spine and femur ?Repeat DEXA ?Has been on Vit D and Calcium ?Not exercising ? ?

## 2022-02-22 NOTE — Assessment & Plan Note (Signed)
Recent stress d/t loss of her dad ?Due for vaccines ?-covid, refuses ?-TDAP, advised come to office/UC if get cut  ?-shingles, previously had shingles and was waiting to be 'in the clear'- OK to get now ?-due for mammo--> ordered ?-due for dexa--> ordered, advised that gather evidence to check to see if OP is worsening given use of bisphosphates  ?-dm foot exam- refused, educated--> denies complaints ?

## 2022-02-22 NOTE — Assessment & Plan Note (Signed)
A1c previously well controlled;  ?Continue to recommend balanced, lower carb meals. Smaller meal size, adding snacks. Choosing water as drink of choice and increasing purposeful exercise. ?Recent weight gain, however, added stressors- winter holidays and birthday ?Repeat A1c and urine micro albumin ?Refuses DM foot exam ?

## 2022-02-22 NOTE — Assessment & Plan Note (Signed)
HLD ?- medications: lipitor 10 mg ?- compliance: good ?- medication SEs: none ? ?The 10-year ASCVD risk score (Arnett DK, et al., 2019) is: 17.2% ?  Values used to calculate the score: ?    Age: 71 years ?    Sex: Female ?    Is Non-Hispanic African American: No ?    Diabetic: Yes ?    Tobacco smoker: No ?    Systolic Blood Pressure: 071 mmHg ?    Is BP treated: No ?    HDL Cholesterol: 57 mg/dL ?    Total Cholesterol: 186 mg/dL ? ?Chronic, elevated risk ?-discussed need to increase medication off of FLP today ?recommend diet low in saturated fat and regular exercise - 30 min at least 5 times per week ? ? ?

## 2022-02-23 ENCOUNTER — Other Ambulatory Visit: Payer: Self-pay | Admitting: Family Medicine

## 2022-02-23 DIAGNOSIS — E1169 Type 2 diabetes mellitus with other specified complication: Secondary | ICD-10-CM

## 2022-02-23 DIAGNOSIS — E785 Hyperlipidemia, unspecified: Secondary | ICD-10-CM

## 2022-02-23 LAB — COMPREHENSIVE METABOLIC PANEL
ALT: 11 IU/L (ref 0–32)
AST: 14 IU/L (ref 0–40)
Albumin/Globulin Ratio: 1.4 (ref 1.2–2.2)
Albumin: 4.4 g/dL (ref 3.7–4.7)
Alkaline Phosphatase: 70 IU/L (ref 44–121)
BUN/Creatinine Ratio: 16 (ref 12–28)
BUN: 16 mg/dL (ref 8–27)
Bilirubin Total: 0.3 mg/dL (ref 0.0–1.2)
CO2: 21 mmol/L (ref 20–29)
Calcium: 10 mg/dL (ref 8.7–10.3)
Chloride: 102 mmol/L (ref 96–106)
Creatinine, Ser: 0.98 mg/dL (ref 0.57–1.00)
Globulin, Total: 3.2 g/dL (ref 1.5–4.5)
Glucose: 132 mg/dL — ABNORMAL HIGH (ref 70–99)
Potassium: 4.2 mmol/L (ref 3.5–5.2)
Sodium: 138 mmol/L (ref 134–144)
Total Protein: 7.6 g/dL (ref 6.0–8.5)
eGFR: 62 mL/min/{1.73_m2} (ref >59)

## 2022-02-23 LAB — CBC WITH DIFFERENTIAL/PLATELET
Basophils Absolute: 0.1 10*3/uL (ref 0.0–0.2)
Basos: 1 %
EOS (ABSOLUTE): 0.1 10*3/uL (ref 0.0–0.4)
Eos: 2 %
Hematocrit: 40.2 % (ref 34.0–46.6)
Hemoglobin: 13.2 g/dL (ref 11.1–15.9)
Immature Grans (Abs): 0 10*3/uL (ref 0.0–0.1)
Immature Granulocytes: 0 %
Lymphocytes Absolute: 1.5 10*3/uL (ref 0.7–3.1)
Lymphs: 34 %
MCH: 27.8 pg (ref 26.6–33.0)
MCHC: 32.8 g/dL (ref 31.5–35.7)
MCV: 85 fL (ref 79–97)
Monocytes Absolute: 0.5 10*3/uL (ref 0.1–0.9)
Monocytes: 11 %
Neutrophils Absolute: 2.2 10*3/uL (ref 1.4–7.0)
Neutrophils: 52 %
Platelets: 285 10*3/uL (ref 150–450)
RBC: 4.75 x10E6/uL (ref 3.77–5.28)
RDW: 13.8 % (ref 11.7–15.4)
WBC: 4.3 10*3/uL (ref 3.4–10.8)

## 2022-02-23 LAB — MICROALBUMIN / CREATININE URINE RATIO
Creatinine, Urine: 108.6 mg/dL
Microalb/Creat Ratio: 135 mg/g creat — ABNORMAL HIGH (ref 0–29)
Microalbumin, Urine: 147 ug/mL

## 2022-02-23 LAB — LIPID PANEL WITH LDL/HDL RATIO
Cholesterol, Total: 198 mg/dL (ref 100–199)
HDL: 59 mg/dL (ref >39)
LDL Chol Calc (NIH): 109 mg/dL — ABNORMAL HIGH (ref 0–99)
LDL/HDL Ratio: 1.8 ratio (ref 0.0–3.2)
Triglycerides: 174 mg/dL — ABNORMAL HIGH (ref 0–149)
VLDL Cholesterol Cal: 30 mg/dL (ref 5–40)

## 2022-02-23 LAB — HEMOGLOBIN A1C
Est. average glucose Bld gHb Est-mCnc: 192 mg/dL
Hgb A1c MFr Bld: 8.3 % — ABNORMAL HIGH (ref 4.8–5.6)

## 2022-02-23 LAB — TSH: TSH: 1.18 u[IU]/mL (ref 0.450–4.500)

## 2022-02-23 MED ORDER — ROSUVASTATIN CALCIUM 5 MG PO TABS: 5.0000 mg | ORAL_TABLET | Freq: Every day | ORAL | 3 refills | Status: DC

## 2022-02-24 ENCOUNTER — Encounter: Payer: Self-pay | Admitting: Family Medicine

## 2022-02-24 ENCOUNTER — Other Ambulatory Visit: Payer: Self-pay | Admitting: Family Medicine

## 2022-02-24 MED ORDER — METFORMIN HCL ER 750 MG PO TB24: 750.0000 mg | ORAL_TABLET | Freq: Every day | ORAL | 3 refills | Status: DC

## 2022-02-25 DIAGNOSIS — R000 Tachycardia, unspecified: Secondary | ICD-10-CM | POA: Diagnosis not present

## 2022-03-11 NOTE — Progress Notes (Signed)
? ? [employer] as a Education administrator for Gwyneth Sprout, FNP.,have documented all relevant documentation on the behalf of Gwyneth Sprout, FNP,as directed by  Gwyneth Sprout, FNP while in the presence of Gwyneth Sprout, FNP.  ? ? ?Established patient visit ? ? ?Patient: Holly Matthews   DOB: 1951/04/06   71 y.o. Female  MRN: [id number] ?Visit Date: 03/12/2022 ? ?Today's healthcare provider: Gwyneth Sprout, FNP  ?Re Introduced to nurse practitioner role and practice setting.  All questions answered.  Discussed provider/patient relationship and expectations. ? ? ?Chief Complaint  ?Patient presents with  ? Diabetes  ? ?Subjective  ?  ?HPI  ?Patient coming to discuss plan for diabetes medicine.  ?Patient was seen a few weeks ago for T2DM. ?Patient's diabetes was diet controlled. In the last labs A1c >8%.She was recommend starting daily medication. Metformin is first line medication. Insulin could also be started given A1c >8%. ? ?Patient has started Metformin XR at 750 mg. She is concerned as fasting blood glucose has not seemed to have changed in the short time since she began the medication. ? ?Lab Results  ?Component Value Date  ? HGBA1C 8.3 (H) 02/22/2022  ? HGBA1C 7.2 (A) 07/17/2021  ? HGBA1C 7.4 (H) 02/02/2021  ? ?Wt Readings from Last 3 Encounters:  ?02/22/22 137 lb (62.1 kg)  ?12/18/21 126 lb (57.2 kg)  ?07/17/21 123 lb (55.8 kg)  ?---------------------------------------------------------------------------------------------------  ? ?Medications: ?Outpatient Medications Prior to Visit  ?Medication Sig  ? calcium-vitamin D (OSCAL WITH D) 500-200 MG-UNIT TABS tablet Take 1 tablet by mouth in the morning and at bedtime.  ? metFORMIN (GLUCOPHAGE-XR) 750 MG 24 hr tablet Take 1 tablet (750 mg total) by mouth daily with breakfast.  ? pantoprazole (PROTONIX) 40 MG tablet TAKE 1 TABLET BY MOUTH  TWICE DAILY  ? [DISCONTINUED] rosuvastatin (CRESTOR) 5 MG tablet Take 1 tablet (5 mg total) by mouth daily. (Patient not  taking: Reported on 03/12/2022)  ? ?No facility-administered medications prior to visit.  ? ? ?Review of Systems ? ? ?  Objective  ?  ?BP (!) 98/59   Pulse 83   Resp 15   SpO2 99%  ? ? ?Physical Exam ?Vitals and nursing note reviewed.  ?Constitutional:   ?   General: She is not in acute distress. ?   Appearance: Normal appearance. She is overweight. She is not ill-appearing, toxic-appearing or diaphoretic.  ?HENT:  ?   Head: Normocephalic and atraumatic.  ?Cardiovascular:  ?   Rate and Rhythm: Normal rate and regular rhythm.  ?   Pulses: Normal pulses.  ?   Heart sounds: Normal heart sounds. No murmur heard. ?  No friction rub. No gallop.  ?Pulmonary:  ?   Effort: Pulmonary effort is normal. No respiratory distress.  ?   Breath sounds: Normal breath sounds. No stridor. No wheezing, rhonchi or rales.  ?Chest:  ?   Chest wall: No tenderness.  ?Abdominal:  ?   General: Bowel sounds are normal.  ?   Palpations: Abdomen is soft.  ?Musculoskeletal:     ?   General: No swelling, tenderness, deformity or signs of injury. Normal range of motion.  ?   Right lower leg: No edema.  ?   Left lower leg: No edema.  ?Skin: ?   General: Skin is warm and dry.  ?   Capillary Refill: Capillary refill takes less than 2 seconds.  ?   Coloration: Skin is not jaundiced or pale.  ?  Findings: No bruising, erythema, lesion or rash.  ?Neurological:  ?   General: No focal deficit present.  ?   Mental Status: She is alert and oriented to person, place, and time. Mental status is at baseline.  ?   Cranial Nerves: No cranial nerve deficit.  ?   Sensory: No sensory deficit.  ?   Motor: No weakness.  ?   Coordination: Coordination normal.  ?Psychiatric:     ?   Mood and Affect: Mood normal.     ?   Behavior: Behavior normal.     ?   Thought Content: Thought content normal.     ?   Judgment: Judgment normal.  ?  ? ?No results found for any visits on 03/12/22. ? Assessment & Plan  ?  ? ?Problem List Items Addressed This Visit   ? ?  ? Endocrine  ?  Hyperlipidemia associated with type 2 diabetes mellitus ([place])  ?  HLD associated with DM, discussed use of Crestor 5 mg in place of previous statin ?Patient has just filled previous medication and has not started Crestor 5 mg at this time ?Discuss that diet, exercise, and use of statin drugs can assist in meeting goal of LDL <70 which is best to prevent stroke/heart disease ?Patient is willing to try new statin and will report back and myalgias.  ?  ?  ? Relevant Medications  ? rosuvastatin (CRESTOR) 5 MG tablet  ? Type 2 diabetes mellitus with hyperglycemia, without long-term current use of insulin (HCC) - Primary  ?  A1c previous well controlled, review of labs show increase of 1.1% from 7.2 to 8.3% in last year ?Patient in office today to discuss use of medication, Metformin XR, which was ordered following return of previous A1c ?Pt reports some GI side effects with use; however, she has also increased her fiber in her diet and is not sure which is causing the GI concerns ?Patient is requesting a new glucometer; she has been using her dad's and the strips that he uses are expensive ?Will order today ?Discussed options for meals and use of [facility] lifestyle center ?History of Hyperglycemia; denies hypoglycemia  ?Recommend 3 month follow up given with RTC OV ?  ?  ? Relevant Medications  ? rosuvastatin (CRESTOR) 5 MG tablet  ? blood glucose meter kit and supplies  ? Other Relevant Orders  ? Amb ref to Medical Nutrition Therapy-MNT  ?  ? Other  ? Hyperlipidemia LDL goal <70  ?  Noted with HLD and T2DM ?Crestor 53m ?  ?  ? Relevant Medications  ? rosuvastatin (CRESTOR) 5 MG tablet  ? ? ? ?Return in about 3 months (around 06/12/2022) for T2DM management.  ?   ? ?I, EGwyneth Sprout FNP, have reviewed all documentation for this visit. The documentation on 03/15/22 for the exam, diagnosis, procedures, and orders are all accurate and complete. ? ? ?EGwyneth Sprout FNP  ?[place]?[phone] (phone) ?[phone](fax) ? ?[facility]  ?

## 2022-03-12 ENCOUNTER — Other Ambulatory Visit: Payer: Self-pay

## 2022-03-12 ENCOUNTER — Encounter: Payer: Self-pay | Admitting: Family Medicine

## 2022-03-12 ENCOUNTER — Other Ambulatory Visit: Payer: Self-pay | Admitting: Family Medicine

## 2022-03-12 ENCOUNTER — Ambulatory Visit (INDEPENDENT_AMBULATORY_CARE_PROVIDER_SITE_OTHER): Payer: Medicare Other | Admitting: Family Medicine

## 2022-03-12 VITALS — BP 98/59 | HR 83 | Resp 15

## 2022-03-12 DIAGNOSIS — E1165 Type 2 diabetes mellitus with hyperglycemia: Secondary | ICD-10-CM | POA: Diagnosis not present

## 2022-03-12 DIAGNOSIS — E1169 Type 2 diabetes mellitus with other specified complication: Secondary | ICD-10-CM

## 2022-03-12 DIAGNOSIS — E785 Hyperlipidemia, unspecified: Secondary | ICD-10-CM

## 2022-03-12 MED ORDER — BLOOD GLUCOSE METER KIT: PACK | 0 refills | Status: AC

## 2022-03-12 MED ORDER — ROSUVASTATIN CALCIUM 5 MG PO TABS: 5.0000 mg | ORAL_TABLET | Freq: Every day | ORAL | 0 refills | Status: DC

## 2022-03-15 ENCOUNTER — Encounter: Payer: Self-pay | Admitting: Family Medicine

## 2022-03-15 DIAGNOSIS — E785 Hyperlipidemia, unspecified: Secondary | ICD-10-CM | POA: Insufficient documentation

## 2022-03-15 DIAGNOSIS — E1169 Type 2 diabetes mellitus with other specified complication: Secondary | ICD-10-CM | POA: Insufficient documentation

## 2022-03-15 NOTE — Assessment & Plan Note (Signed)
Noted with HLD and T2DM ?Crestor '5mg'$  ?

## 2022-03-15 NOTE — Assessment & Plan Note (Signed)
A1c previous well controlled, review of labs show increase of 1.1% from 7.2 to 8.3% in last year ?Patient in office today to discuss use of medication, Metformin XR, which was ordered following return of previous A1c ?Pt reports some GI side effects with use; however, she has also increased her fiber in her diet and is not sure which is causing the GI concerns ?Patient is requesting a new glucometer; she has been using her dad's and the strips that he uses are expensive ?Will order today ?Discussed options for meals and use of [facility] lifestyle center ?History of Hyperglycemia; denies hypoglycemia  ?Recommend 3 month follow up given with RTC OV ?

## 2022-03-15 NOTE — Assessment & Plan Note (Signed)
HLD associated with DM, discussed use of Crestor 5 mg in place of previous statin ?Patient has just filled previous medication and has not started Crestor 5 mg at this time ?Discuss that diet, exercise, and use of statin drugs can assist in meeting goal of LDL <70 which is best to prevent stroke/heart disease ?Patient is willing to try new statin and will report back and myalgias.  ?

## 2022-03-16 MED ORDER — ACCU-CHEK SOFTCLIX LANCETS MISC: 12 refills | Status: DC

## 2022-03-16 MED ORDER — GLUCOSE BLOOD VI STRP
ORAL_STRIP | 12 refills | Status: DC
Start: 2022-03-16 — End: 2023-09-14

## 2022-03-18 DIAGNOSIS — R000 Tachycardia, unspecified: Secondary | ICD-10-CM | POA: Diagnosis not present

## 2022-03-29 ENCOUNTER — Encounter: Payer: Medicare Other | Attending: Family Medicine | Admitting: *Deleted

## 2022-03-29 ENCOUNTER — Encounter: Payer: Self-pay | Admitting: *Deleted

## 2022-03-29 VITALS — BP 98/60 | Ht 60.0 in | Wt 121.7 lb

## 2022-03-29 DIAGNOSIS — E1165 Type 2 diabetes mellitus with hyperglycemia: Secondary | ICD-10-CM | POA: Insufficient documentation

## 2022-03-29 DIAGNOSIS — E119 Type 2 diabetes mellitus without complications: Secondary | ICD-10-CM

## 2022-03-29 DIAGNOSIS — Z6823 Body mass index (BMI) 23.0-23.9, adult: Secondary | ICD-10-CM | POA: Insufficient documentation

## 2022-03-29 DIAGNOSIS — Z713 Dietary counseling and surveillance: Secondary | ICD-10-CM | POA: Insufficient documentation

## 2022-03-29 NOTE — Progress Notes (Signed)
Diabetes Self-Management Education ? ?Visit Type: First/Initial ? ?Appt. Start Time: 1515 Appt. End Time: 1448 ? ?03/29/2022 ? ?[name], identified by name and date of birth, is a 71 y.o. female with a diagnosis of Diabetes: Type 2.  ? ?ASSESSMENT ? ?Blood pressure 98/60, height 5' (1.524 m), weight 121 lb 11.2 oz (55.2 kg). ?Body mass index is 23.77 kg/m?. ? ? Diabetes Self-Management Education - 03/29/22 1923   ? ?  ? Visit Information  ? Visit Type First/Initial   ?  ? Initial Visit  ? Diabetes Type Type 2   ? Are you currently following a meal plan? Yes   ? What type of meal plan do you follow? "eating less"   ? Are you taking your medications as prescribed? Yes   ? Date Diagnosed "1 month ago" - her labs show A1C of 7.0% in 2019   ?  ? Health Coping  ? How would you rate your overall health? Good   ?  ? Psychosocial Assessment  ? Patient Belief/Attitude about Diabetes Other (comment)   "threw me for a loop"  ? Self-care barriers None   ? Self-management support Doctor's office;Family   ? Patient Concerns Nutrition/Meal planning;Medication;Glycemic Control   ? Special Needs None   ? Preferred Learning Style Other (comment)   talking/discussion  ? Learning Readiness Change in progress   ? How often do you need to have someone help you when you read instructions, pamphlets, or other written materials from your doctor or pharmacy? 1 - Never   ? What is the last grade level you completed in school? 14   ?  ? Pre-Education Assessment  ? Patient understands the diabetes disease and treatment process. Needs Review   ? Patient understands incorporating nutritional management into lifestyle. Needs Review   ? Patient undertands incorporating physical activity into lifestyle. Needs Instruction   ? Patient understands using medications safely. Needs Instruction   ? Patient understands monitoring blood glucose, interpreting and using results Needs Instruction   ? Patient understands prevention, detection, and  treatment of acute complications. Needs Instruction   ? Patient understands prevention, detection, and treatment of chronic complications. Needs Review   ? Patient understands how to develop strategies to address psychosocial issues. Needs Review   ? Patient understands how to develop strategies to promote health/change behavior. Needs Review   ?  ? Complications  ? Last HgB A1C per patient/outside source 8.3 %   02/22/2022  ? How often do you check your blood sugar? 1-2 times/day   ? Fasting Blood glucose range (mg/dL) 70-129;130-179   FBG's 125-141 mg/dL  ? Have you had a dilated eye exam in the past 12 months? Yes   ? Have you had a dental exam in the past 12 months? Yes   ? Are you checking your feet? Yes   ? How many days per week are you checking your feet? 1   ?  ? Dietary Intake  ? Breakfast cereal and milk; breakfast bar   ? Lunch biggest meal - grlled chicken salad; meatloaf sandwich and veggies - potatoes, green beans, corn, beans, peas, pasta   ? Snack (afternoon) cheetos, nuts   ? Dinner 1/2 sandwich and chips - pimento cheese   ? Beverage(s) Water, Crystal Light   ?  ? Exercise  ? Exercise Type ADL's   ?  ? Patient Education  ? Previous Diabetes Education Yes (please comment)   came with her husband years ago for education  ?  Disease state  Factors that contribute to the development of diabetes;Explored patient's options for treatment of their diabetes   ? Nutrition management  Role of diet in the treatment of diabetes and the relationship between the three main macronutrients and blood glucose level;Food label reading, portion sizes and measuring food.;Carbohydrate counting;Reviewed blood glucose goals for pre and post meals and how to evaluate the patients' food intake on their blood glucose level.;Meal timing in regards to the patients' current diabetes medication.   ? Physical activity and exercise  Role of exercise on diabetes management, blood pressure control and cardiac health.   ? Medications  Reviewed patients medication for diabetes, action, purpose, timing of dose and side effects.   ? Monitoring Purpose and frequency of SMBG.;Taught/discussed recording of test results and interpretation of SMBG.;Identified appropriate SMBG and/or A1C goals.   ? Chronic complications Relationship between chronic complications and blood glucose control   ? Psychosocial adjustment Role of stress on diabetes;Identified and addressed patients feelings and concerns about diabetes   ?  ? Individualized Goals (developed by patient)  ? Reducing Risk Other (comment)   improve blood sugars, decrease medications  ?  ? Outcomes  ? Expected Outcomes Demonstrated interest in learning. Expect positive outcomes   ? Program Status Not Completed   ? ?  ?  ?Individualized Plan for Diabetes Self-Management Training:  ? ?Learning Objective:  Patient will have a greater understanding of diabetes self-management. ?Patient education plan is to attend individual and/or group sessions per assessed needs and concerns. ?  ?Plan:  ? ?Patient Instructions  ?Check blood sugars 1 x day before breakfast or 2 hrs after one meal every day ?Bring blood sugar records to the next MD appointment ? ?Exercise:  Begin walking for    10-15  minutes   3  days a week and gradually increase to 30 minutes 5 x week ? ?Eat 3 meals day,   1-2  snacks a day ?Space meals 4-6 hours apart ?Include 1 serving of protein with breakfast ?Allow 2-3 hours between meals and snacks ? ?Call back if you want to come to classes or have another appointment with the nurse or dietitian ? ?Expected Outcomes:  Demonstrated interest in learning. Expect positive outcomes ? ?Education material provided:  ?Metallurgist Guidelines ?Simple Meal Plan ?Quick and Balanced Meals ?Making Choices Using Food Labels (ADA) ? ?If problems or questions, patient to contact team via:   ?Johny Drilling, RN, [place], [place] [phone] ? ?Future DSME appointment: ?The patient doesn't want to return  for further Diabetes education at this time.  ?

## 2022-03-29 NOTE — Patient Instructions (Signed)
Check blood sugars 1 x day before breakfast or 2 hrs after one meal every day ?Bring blood sugar records to the next MD appointment ? ?Exercise:  Begin walking for    10-15  minutes   3  days a week and gradually increase to 30 minutes 5 x week ? ?Eat 3 meals day,   1-2  snacks a day ?Space meals 4-6 hours apart ?Include 1 serving of protein with breakfast ?Allow 2-3 hours between meals and snacks ? ?Call back if you want to come to classes or have another appointment with the nurse or dietitian ?

## 2022-04-05 ENCOUNTER — Encounter: Payer: Self-pay | Admitting: Family Medicine

## 2022-04-05 ENCOUNTER — Other Ambulatory Visit: Payer: Self-pay | Admitting: Family Medicine

## 2022-04-05 DIAGNOSIS — E1169 Type 2 diabetes mellitus with other specified complication: Secondary | ICD-10-CM

## 2022-04-05 DIAGNOSIS — E785 Hyperlipidemia, unspecified: Secondary | ICD-10-CM

## 2022-04-05 DIAGNOSIS — T753XXA Motion sickness, initial encounter: Secondary | ICD-10-CM

## 2022-04-05 MED ORDER — ROSUVASTATIN CALCIUM 5 MG PO TABS: 5.0000 mg | ORAL_TABLET | Freq: Every day | ORAL | 0 refills | Status: DC

## 2022-04-05 MED ORDER — SCOPOLAMINE 1 MG/3DAYS TD PT72: 1.0000 | MEDICATED_PATCH | TRANSDERMAL | 12 refills | Status: DC

## 2022-04-05 NOTE — Telephone Encounter (Signed)
RX for 90 day supply for Rosuvastatin refilled. ?Patient going on a cruise next week. What do you recommended for motion sickness? Would rather not take Benadryl. If prescription med, please call in to [facility] in [place]. ? ?Thanks. ?

## 2022-04-13 ENCOUNTER — Other Ambulatory Visit: Payer: Medicare Other

## 2022-04-20 ENCOUNTER — Ambulatory Visit
Admission: RE | Admit: 2022-04-20 | Discharge: 2022-04-20 | Disposition: A | Discharge status: Home / Self Care | Payer: Medicare Other | Source: Ambulatory Visit | Attending: Family Medicine | Admitting: Family Medicine

## 2022-04-20 DIAGNOSIS — Z1231 Encounter for screening mammogram for malignant neoplasm of breast: Secondary | ICD-10-CM | POA: Diagnosis not present

## 2022-04-20 DIAGNOSIS — M810 Age-related osteoporosis without current pathological fracture: Secondary | ICD-10-CM

## 2022-06-07 ENCOUNTER — Other Ambulatory Visit: Payer: Self-pay | Admitting: Family Medicine

## 2022-06-07 DIAGNOSIS — E785 Hyperlipidemia, unspecified: Secondary | ICD-10-CM

## 2022-06-08 NOTE — Telephone Encounter (Signed)
Requested Prescriptions  Pending Prescriptions Disp Refills  . rosuvastatin (CRESTOR) 5 MG tablet [Pharmacy Med Name: Rosuvastatin Calcium 5 MG Oral Tablet] 90 tablet 1    Sig: TAKE 1 TABLET BY MOUTH DAILY     Cardiovascular:  Antilipid - Statins 2 Failed - 06/07/2022  1:12 PM      Failed - Lipid Panel in normal range within the last 12 months    Cholesterol, Total  Date Value Ref Range Status  02/22/2022 198 100 - 199 mg/dL Final   LDL Cholesterol (Calc)  Date Value Ref Range Status  12/14/2017 113 (H) mg/dL (calc) Final    Comment:    Reference range: <100 . Desirable range <100 mg/dL for primary prevention;   <70 mg/dL for patients with CHD or diabetic patients  with > or = 2 CHD risk factors. Marland Kitchen LDL-C is now calculated using the Martin-Hopkins  calculation, which is a validated novel method providing  better accuracy than the Friedewald equation in the  estimation of LDL-C.  Cresenciano Genre et al. Annamaria Helling. 7353;299(24): 2061-2068  (http://education.[employer].com/faq/FAQ164)    LDL Chol Calc (NIH)  Date Value Ref Range Status  02/22/2022 109 (H) 0 - 99 mg/dL Final   HDL  Date Value Ref Range Status  02/22/2022 59 >39 mg/dL Final   Triglycerides  Date Value Ref Range Status  02/22/2022 174 (H) 0 - 149 mg/dL Final         Passed - Cr in normal range and within 360 days    Creat  Date Value Ref Range Status  12/14/2017 0.76 0.50 - 0.99 mg/dL Final    Comment:    For patients >46 years of age, the reference limit for Creatinine is approximately 13% higher for people identified as African-American. .    Creatinine, Ser  Date Value Ref Range Status  02/22/2022 0.98 0.57 - 1.00 mg/dL Final         Passed - Patient is not pregnant      Passed - Valid encounter within last 12 months    Recent Outpatient Visits          2 months ago Type 2 diabetes mellitus with hyperglycemia, without long-term current use of insulin [facility])   [facility] Tally Joe T, FNP   3 months ago Type 2 diabetes mellitus with hyperglycemia, without long-term current use of insulin [facility])   [facility] Tally Joe T, FNP   5 months ago Mass of soft tissue   [employer], Erin E, PA-C   10 months ago Type 2 diabetes mellitus without complication, without long-term current use of insulin [facility])   [facility] Gwyneth Sprout, FNP   1 year ago Weakness of both lower extremities   [facility], [place], [place]

## 2022-06-20 ENCOUNTER — Other Ambulatory Visit: Payer: Self-pay | Admitting: Family Medicine

## 2022-07-12 ENCOUNTER — Ambulatory Visit (INDEPENDENT_AMBULATORY_CARE_PROVIDER_SITE_OTHER): Payer: Medicare Other | Admitting: Family Medicine

## 2022-07-12 ENCOUNTER — Encounter: Payer: Self-pay | Admitting: Family Medicine

## 2022-07-12 VITALS — BP 100/87 | HR 78 | Temp 98.2°F | Resp 16 | Wt 121.7 lb

## 2022-07-12 DIAGNOSIS — E785 Hyperlipidemia, unspecified: Secondary | ICD-10-CM | POA: Diagnosis not present

## 2022-07-12 DIAGNOSIS — E1169 Type 2 diabetes mellitus with other specified complication: Secondary | ICD-10-CM

## 2022-07-12 DIAGNOSIS — E1165 Type 2 diabetes mellitus with hyperglycemia: Secondary | ICD-10-CM | POA: Diagnosis not present

## 2022-07-12 DIAGNOSIS — D171 Benign lipomatous neoplasm of skin and subcutaneous tissue of trunk: Secondary | ICD-10-CM

## 2022-07-12 DIAGNOSIS — R42 Dizziness and giddiness: Secondary | ICD-10-CM | POA: Diagnosis not present

## 2022-07-12 LAB — POCT GLYCOSYLATED HEMOGLOBIN (HGB A1C)
Est. average glucose Bld gHb Est-mCnc: 148
Hemoglobin A1C: 6.8 % — AB (ref 4.0–5.6)

## 2022-07-12 NOTE — Progress Notes (Signed)
Established patient visit  I,Holly Matthews,acting as a scribe for Gwyneth Sprout, FNP.,have documented all relevant documentation on the behalf of Gwyneth Sprout, FNP,as directed by  Gwyneth Sprout, FNP while in the presence of Gwyneth Sprout, FNP.   Patient: Holly Matthews   DOB: 10-03-1951   71 y.o. Female  MRN: [id number] Visit Date: 07/12/2022  Today's healthcare provider: Gwyneth Sprout, FNP  Re Introduced to nurse practitioner role and practice setting.  All questions answered.  Discussed provider/patient relationship and expectations.   Chief Complaint  Patient presents with   follow-up DM   Subjective    HPI  Diabetes Mellitus Type II, Follow-up  Lab Results  Component Value Date   HGBA1C 6.8 (A) 07/12/2022   HGBA1C 8.3 (H) 02/22/2022   HGBA1C 7.2 (A) 07/17/2021   Wt Readings from Last 3 Encounters:  07/12/22 121 lb 11.2 oz (55.2 kg)  03/29/22 121 lb 11.2 oz (55.2 kg)  02/22/22 137 lb (62.1 kg)   Last seen for diabetes 3 months ago.  Management since then includes none. She reports excellent compliance with treatment. She is not having side effects.  Symptoms: No fatigue No foot ulcerations  No appetite changes No nausea  No paresthesia of the feet  No polydipsia  No polyuria No visual disturbances   No vomiting     Home blood sugar records: fasting range: 130-135  Episodes of hypoglycemia? No    Current insulin regiment: none Most Recent Eye Exam: UTD Current exercise: walking Current diet habits: well balanced  Pertinent Labs: Lab Results  Component Value Date   CHOL 198 02/22/2022   HDL 59 02/22/2022   LDLCALC 109 (H) 02/22/2022   TRIG 174 (H) 02/22/2022   CHOLHDL 3.3 12/12/2019   Lab Results  Component Value Date   NA 138 02/22/2022   K 4.2 02/22/2022   CREATININE 0.98 02/22/2022   EGFR 62 02/22/2022   MICROALBUR negative 02/15/2021   LABMICR 147.0 02/22/2022      ---------------------------------------------------------------------------------------------------   Medications: Outpatient Medications Prior to Visit  Medication Sig   Accu-Chek Softclix Lancets lancets Use as instructed   blood glucose meter kit and supplies Dispense based on patient and insurance preference. Use up to four times daily as directed. (FOR ICD-10 E10.9, E11.9).   calcium-vitamin D (OSCAL WITH D) 500-200 MG-UNIT TABS tablet Take 1 tablet by mouth in the morning and at bedtime.   glucose blood test strip Use as instructed   metFORMIN (GLUCOPHAGE-XR) 750 MG 24 hr tablet Take 1 tablet (750 mg total) by mouth daily with breakfast.   pantoprazole (PROTONIX) 40 MG tablet TAKE 1 TABLET BY MOUTH  TWICE DAILY   rosuvastatin (CRESTOR) 5 MG tablet TAKE 1 TABLET BY MOUTH DAILY   [DISCONTINUED] scopolamine (TRANSDERM-SCOP) 1 MG/3DAYS Place 1 patch (1.5 mg total) onto the skin every 3 (three) days. Place behind ear; alternate sites.   No facility-administered medications prior to visit.    Review of Systems  Last CBC Lab Results  Component Value Date   WBC 4.3 02/22/2022   HGB 13.2 02/22/2022   HCT 40.2 02/22/2022   MCV 85 02/22/2022   MCH 27.8 02/22/2022   RDW 13.8 02/22/2022   PLT 285 24/26/8341   Last metabolic panel Lab Results  Component Value Date   GLUCOSE 132 (H) 02/22/2022   NA 138 02/22/2022   K 4.2 02/22/2022   CL 102 02/22/2022   CO2 21 02/22/2022   BUN  16 02/22/2022   CREATININE 0.98 02/22/2022   EGFR 62 02/22/2022   CALCIUM 10.0 02/22/2022   PHOS 2.8 (L) 05/28/2019   PROT 7.6 02/22/2022   ALBUMIN 4.4 02/22/2022   LABGLOB 3.2 02/22/2022   AGRATIO 1.4 02/22/2022   BILITOT 0.3 02/22/2022   ALKPHOS 70 02/22/2022   AST 14 02/22/2022   ALT 11 02/22/2022   ANIONGAP 11 10/14/2019   Last lipids Lab Results  Component Value Date   CHOL 198 02/22/2022   HDL 59 02/22/2022   LDLCALC 109 (H) 02/22/2022   TRIG 174 (H) 02/22/2022   CHOLHDL 3.3  12/12/2019   Last hemoglobin A1c Lab Results  Component Value Date   HGBA1C 6.8 (A) 07/12/2022   Last thyroid functions Lab Results  Component Value Date   TSH 1.180 02/22/2022   Last vitamin D Lab Results  Component Value Date   VD25OH 26.1 (L) 02/10/2021   Last vitamin B12 and Folate Lab Results  Component Value Date   VITAMINB12 632 02/10/2021       Objective    BP 100/87 (BP Location: Right Arm, Patient Position: Sitting, Cuff Size: Normal)   Pulse 78   Temp 98.2 F (36.8 C) (Oral)   Resp 16   Wt 121 lb 11.2 oz (55.2 kg)   BMI 23.77 kg/m  BP Readings from Last 3 Encounters:  07/12/22 100/87  03/29/22 98/60  03/12/22 (!) 98/59   Wt Readings from Last 3 Encounters:  07/12/22 121 lb 11.2 oz (55.2 kg)  03/29/22 121 lb 11.2 oz (55.2 kg)  02/22/22 137 lb (62.1 kg)   SpO2 Readings from Last 3 Encounters:  03/12/22 99%  02/22/22 99%  12/18/21 96%      Physical Exam Vitals and nursing note reviewed.  Constitutional:      General: She is not in acute distress.    Appearance: Normal appearance. She is normal weight. She is not ill-appearing, toxic-appearing or diaphoretic.  HENT:     Head: Normocephalic and atraumatic.  Cardiovascular:     Rate and Rhythm: Normal rate and regular rhythm.     Pulses: Normal pulses.     Heart sounds: Normal heart sounds. No murmur heard.    No friction rub. No gallop.  Pulmonary:     Effort: Pulmonary effort is normal. No respiratory distress.     Breath sounds: Normal breath sounds. No stridor. No wheezing, rhonchi or rales.  Chest:     Chest wall: No tenderness.  Abdominal:     General: Bowel sounds are normal.     Palpations: Abdomen is soft.  Musculoskeletal:        General: No swelling, tenderness, deformity or signs of injury. Normal range of motion.     Right lower leg: No edema.     Left lower leg: No edema.  Skin:    General: Skin is warm and dry.     Capillary Refill: Capillary refill takes less than 2  seconds.     Coloration: Skin is not jaundiced or pale.     Findings: Lesion present. No bruising, erythema or rash.     Comments: Small, lipoma on left mid back  Neurological:     General: No focal deficit present.     Mental Status: She is alert and oriented to person, place, and time. Mental status is at baseline.     Cranial Nerves: No cranial nerve deficit.     Sensory: No sensory deficit.     Motor: No weakness.  Coordination: Coordination normal.  Psychiatric:        Mood and Affect: Mood normal.        Behavior: Behavior normal.        Thought Content: Thought content normal.        Judgment: Judgment normal.       Results for orders placed or performed in visit on 07/12/22  POCT glycosylated hemoglobin (Hb A1C)  Result Value Ref Range   Hemoglobin A1C 6.8 (A) 4.0 - 5.6 %   Est. average glucose Bld gHb Est-mCnc 148     Assessment & Plan     Problem List Items Addressed This Visit       Endocrine   Hyperlipidemia associated with type 2 diabetes mellitus ([place])    Chronic, previous LDL elevated >100 On crestor at 5 mg Repeat lipids in 3 months recommend diet low in saturated fat and regular exercise - 30 min at least 5 times per week Goal 55-70      Type 2 diabetes mellitus with hyperglycemia, without long-term current use of insulin (HCC) - Primary    Chronic, improved Now 6.8% Continue 750 XR Metformin Continue to recommend balanced, lower carb meals. Smaller meal size, adding snacks. Choosing water as drink of choice and increasing purposeful exercise. 3 month f/u      Relevant Orders   POCT glycosylated hemoglobin (Hb A1C) (Completed)     Other   Lipoma of torso    Chronic, stable Previous seen by PA and ultrasound completed Slight pain with positional changes; encouraged use of supportive pillow Recommend use of FNA with surgical if desired; continue to defer at this time given proximity to spine       Vertigo    Acute on chronic,  stable Reports complaints when laying in bed, needs to be cautious when flipping from one side to one another Previously diagnosed with BPPV Declines referral to PT at this time; continue to monitor Focus on hydration and protein rich snacks prior to bed to assist with dehydration/ hypovolemia and hypoglycemia         Return in about 3 months (around 10/12/2022) for chonic disease management.      Vonna Kotyk, FNP, have reviewed all documentation for this visit. The documentation on 07/12/22 for the exam, diagnosis, procedures, and orders are all accurate and complete.    Gwyneth Sprout, [place] [phone] (phone) [phone] (fax)  [place]

## 2022-07-12 NOTE — Assessment & Plan Note (Signed)
Chronic, stable Previous seen by PA and ultrasound completed Slight pain with positional changes; encouraged use of supportive pillow Recommend use of FNA with surgical if desired; continue to defer at this time given proximity to spine

## 2022-07-12 NOTE — Assessment & Plan Note (Signed)
Chronic, improved Now 6.8% Continue 750 XR Metformin Continue to recommend balanced, lower carb meals. Smaller meal size, adding snacks. Choosing water as drink of choice and increasing purposeful exercise. 3 month f/u

## 2022-07-12 NOTE — Assessment & Plan Note (Addendum)
Acute on chronic, stable Reports complaints when laying in bed, needs to be cautious when flipping from one side to one another Previously diagnosed with BPPV Declines referral to PT at this time; continue to monitor Focus on hydration and protein rich snacks prior to bed to assist with dehydration/ hypovolemia and hypoglycemia

## 2022-07-12 NOTE — Assessment & Plan Note (Signed)
Chronic, previous LDL elevated >100 On crestor at 5 mg Repeat lipids in 3 months recommend diet low in saturated fat and regular exercise - 30 min at least 5 times per week Goal 55-70

## 2022-08-22 ENCOUNTER — Other Ambulatory Visit: Payer: Self-pay | Admitting: Family Medicine

## 2022-08-22 DIAGNOSIS — E1169 Type 2 diabetes mellitus with other specified complication: Secondary | ICD-10-CM

## 2022-08-22 DIAGNOSIS — E785 Hyperlipidemia, unspecified: Secondary | ICD-10-CM

## 2022-10-07 ENCOUNTER — Other Ambulatory Visit: Payer: Self-pay | Admitting: Family Medicine

## 2022-10-08 NOTE — Telephone Encounter (Signed)
last RF3/8/23 #90 3 RF  Requested Prescriptions  Refused Prescriptions Disp Refills  . metFORMIN (GLUCOPHAGE-XR) 750 MG 24 hr tablet [Pharmacy Med Name: metFORMIN HCl ER 750 MG Oral Tablet Extended Release 24 Hour] 100 tablet 2    Sig: TAKE 1 TABLET BY MOUTH DAILY  WITH BREAKFAST     Endocrinology:  Diabetes - Biguanides Passed - 10/07/2022 10:55 PM      Passed - Cr in normal range and within 360 days    Creat  Date Value Ref Range Status  12/14/2017 0.76 0.50 - 0.99 mg/dL Final    Comment:    For patients >58 years of age, the reference limit for Creatinine is approximately 13% higher for people identified as African-American. .    Creatinine, Ser  Date Value Ref Range Status  02/22/2022 0.98 0.57 - 1.00 mg/dL Final         Passed - HBA1C is between 0 and 7.9 and within 180 days    Hemoglobin A1C  Date Value Ref Range Status  07/12/2022 6.8 (A) 4.0 - 5.6 % Final   Hgb A1c MFr Bld  Date Value Ref Range Status  02/22/2022 8.3 (H) 4.8 - 5.6 % Final    Comment:             Prediabetes: 5.7 - 6.4          Diabetes: >6.4          Glycemic control for adults with diabetes: <7.0          Passed - eGFR in normal range and within 360 days    GFR, Est African American  Date Value Ref Range Status  12/14/2017 95 > OR = 60 mL/min/1.42m Final   GFR calc Af Amer  Date Value Ref Range Status  02/02/2021 67 >59 mL/min/1.73 Final    Comment:    **In accordance with recommendations from the NKF-ASN Task force,**   [facility] is in the process of updating its eGFR calculation to the   2021 CKD-EPI creatinine equation that estimates kidney function   without a race variable.    GFR, Est Non African American  Date Value Ref Range Status  12/14/2017 82 > OR = 60 mL/min/1.715mFinal   GFR calc non Af Amer  Date Value Ref Range Status  02/02/2021 58 (L) >59 mL/min/1.73 Final   eGFR  Date Value Ref Range Status  02/22/2022 62 >59 mL/min/1.73 Final         Passed - B12 Level  in normal range and within 720 days    Vitamin B-12  Date Value Ref Range Status  02/10/2021 632 232 - 1,245 pg/mL Final         Passed - Valid encounter within last 6 months    Recent Outpatient Visits          2 months ago Type 2 diabetes [facility]cemia, [facility]   7 months ago Type 2 diabetes mellitus with hyperglycemia, [facility]   7 months ago Type 2 diabetes mellitus with hyperglycemia, without long-term current use of insulin ([facility]  [facility]   9 months ago Mass of soft tissue   [employer]NAErin E, PA-C   1 year ago Type 2 diabetes mellitus without complication, without long-term current use of  insulin [facility])   [facility] Tally Joe T, FNP             Passed - CBC within normal limits and completed in the last 12 months    WBC  Date Value Ref Range Status  02/22/2022 4.3 3.4 - 10.8 x10E3/uL Final  10/14/2019 4.2 4.0 - 10.5 K/uL Final   RBC  Date Value Ref Range Status  02/22/2022 4.75 3.77 - 5.28 x10E6/uL Final  10/14/2019 4.84 3.87 - 5.11 MIL/uL Final   Hemoglobin  Date Value Ref Range Status  02/22/2022 13.2 11.1 - 15.9 g/dL Final   Hematocrit  Date Value Ref Range Status  02/22/2022 40.2 34.0 - 46.6 % Final   MCHC  Date Value Ref Range Status  02/22/2022 32.8 31.5 - 35.7 g/dL Final  10/14/2019 32.6 30.0 - 36.0 g/dL Final   [facility]  Date Value Ref Range Status  02/22/2022 27.8 26.6 - 33.0 pg Final  10/14/2019 28.7 26.0 - 34.0 pg Final   MCV  Date Value Ref Range Status  02/22/2022 85 79 - 97 fL Final   No results found for: "PLTCOUNTKUC", "LABPLAT", "POCPLA" RDW  Date Value Ref Range Status  02/22/2022 13.8 11.7 - 15.4 % Final

## 2022-10-20 DIAGNOSIS — H2513 Age-related nuclear cataract, bilateral: Secondary | ICD-10-CM | POA: Diagnosis not present

## 2022-10-20 LAB — HM DIABETES EYE EXAM

## 2022-12-03 ENCOUNTER — Other Ambulatory Visit: Payer: Self-pay | Admitting: Family Medicine

## 2022-12-06 NOTE — Telephone Encounter (Signed)
Unable to refill per protocol, Rx request is too soon. Last refill 02/24/22 for 90 and 3 refills.   Requested Prescriptions  Pending Prescriptions Disp Refills   metFORMIN (GLUCOPHAGE-XR) 750 MG 24 hr tablet [Pharmacy Med Name: metFORMIN HCl ER 750 MG Oral Tablet Extended Release 24 Hour] 60 tablet 5    Sig: TAKE 1 TABLET BY MOUTH DAILY  WITH BREAKFAST     Endocrinology:  Diabetes - Biguanides Passed - 12/03/2022 11:25 PM      Passed - Cr in normal range and within 360 days    Creat  Date Value Ref Range Status  12/14/2017 0.76 0.50 - 0.99 mg/dL Final    Comment:    For patients >54 years of age, the reference limit for Creatinine is approximately 13% higher for people identified as African-American. .    Creatinine, Ser  Date Value Ref Range Status  02/22/2022 0.98 0.57 - 1.00 mg/dL Final         Passed - HBA1C is between 0 and 7.9 and within 180 days    Hemoglobin A1C  Date Value Ref Range Status  07/12/2022 6.8 (A) 4.0 - 5.6 % Final   Hgb A1c MFr Bld  Date Value Ref Range Status  02/22/2022 8.3 (H) 4.8 - 5.6 % Final    Comment:             Prediabetes: 5.7 - 6.4          Diabetes: >6.4          Glycemic control for adults with diabetes: <7.0          Passed - eGFR in normal range and within 360 days    GFR, Est African American  Date Value Ref Range Status  12/14/2017 95 > OR = 60 mL/min/1.76m Final   GFR calc Af Amer  Date Value Ref Range Status  02/02/2021 67 >59 mL/min/1.73 Final    Comment:    **In accordance with recommendations from the NKF-ASN Task force,**   [facility] is in the process of updating its eGFR calculation to the   2021 CKD-EPI creatinine equation that estimates kidney function   without a race variable.    GFR, Est Non African American  Date Value Ref Range Status  12/14/2017 82 > OR = 60 mL/min/1.733mFinal   GFR calc non Af Amer  Date Value Ref Range Status  02/02/2021 58 (L) >59 mL/min/1.73 Final   eGFR  Date Value Ref Range  Status  02/22/2022 62 >59 mL/min/1.73 Final         Passed - B12 Level in normal range and within 720 days    Vitamin B-12  Date Value Ref Range Status  02/10/2021 632 232 - 1,245 pg/mL Final         Passed - Valid encounter within last 6 months    Recent Outpatient Visits           4 months ago Type 2 diabetes mellitus with hyperglycemia, without long-term current use of insulin ([facility]   8 months ago Type 2 diabetes mellitus with hyperglycemia, [facility] long-term current use of [facility]  [facility]   9 months ago Type 2 diabetes mellitus with hyperglycemia, without long-term current use of insulin ([facility]  [facility] Spr[facility]   11 months ago Mass of soft tissue   [facility]  1 year ago Type 2 diabetes mellitus without complication, without long-term current use of insulin [facility])   [facility] T, FNP       Future Appointments             Tomorrow Gwyneth Sprout, FNP [facility]            Passed - CBC within normal limits and completed in the last 12 months    WBC  Date Value Ref Range Status  02/22/2022 4.3 3.4 - 10.8 x10E3/uL Final  10/14/2019 4.2 4.0 - 10.5 K/uL Final   RBC  Date Value Ref Range Status  02/22/2022 4.75 3.77 - 5.28 x10E6/uL Final  10/14/2019 4.84 3.87 - 5.11 MIL/uL Final   Hemoglobin  Date Value Ref Range Status  02/22/2022 13.2 11.1 - 15.9 g/dL Final   Hematocrit  Date Value Ref Range Status  02/22/2022 40.2 34.0 - 46.6 % Final   MCHC  Date Value Ref Range Status  02/22/2022 32.8 31.5 - 35.7 g/dL Final  10/14/2019 32.6 30.0 - 36.0 g/dL Final   [facility]  Date Value Ref Range Status  02/22/2022 27.8 26.6 - 33.0 pg Final  10/14/2019 28.7 26.0 - 34.0 pg Final   MCV  Date Value Ref Range Status  02/22/2022 85 79 - 97 fL Final   No results  found for: "PLTCOUNTKUC", "LABPLAT", "POCPLA" RDW  Date Value Ref Range Status  02/22/2022 13.8 11.7 - 15.4 % Final

## 2022-12-07 ENCOUNTER — Ambulatory Visit (INDEPENDENT_AMBULATORY_CARE_PROVIDER_SITE_OTHER): Payer: Medicare Other | Admitting: Family Medicine

## 2022-12-07 ENCOUNTER — Encounter: Payer: Self-pay | Admitting: Family Medicine

## 2022-12-07 VITALS — BP 105/64 | HR 82 | Temp 97.9°F | Wt 121.7 lb

## 2022-12-07 DIAGNOSIS — E1165 Type 2 diabetes mellitus with hyperglycemia: Secondary | ICD-10-CM | POA: Diagnosis not present

## 2022-12-07 DIAGNOSIS — M25562 Pain in left knee: Secondary | ICD-10-CM

## 2022-12-07 DIAGNOSIS — D171 Benign lipomatous neoplasm of skin and subcutaneous tissue of trunk: Secondary | ICD-10-CM

## 2022-12-07 DIAGNOSIS — G8929 Other chronic pain: Secondary | ICD-10-CM

## 2022-12-07 NOTE — Assessment & Plan Note (Signed)
Small lipoma on L mid back; appears to have grown per previous US. ~3-4 cm x 1.5 cm. Pt reports occasionally discomfort with positioning. Defers referral to general surgery at this time to discuss excision

## 2022-12-07 NOTE — Progress Notes (Signed)
I,Connie R Striblin,acting as a Education administrator for Gwyneth Sprout, FNP.,have documented all relevant documentation on the behalf of Gwyneth Sprout, FNP,as directed by  Gwyneth Sprout, FNP while in the presence of Gwyneth Sprout, FNP.  Established patient visit   Patient: Holly Matthews   DOB: 11-10-1951   71 y.o. Female  MRN: [id number] Visit Date: 12/07/2022  Today's healthcare provider: Gwyneth Sprout, FNP  Re Introduced to nurse practitioner role and practice setting.  All questions answered.  Discussed provider/patient relationship and expectations.  Chief Complaint  Patient presents with   Follow-up   Subjective    HPI  Diabetes Mellitus Type II, Follow-up  Lab Results  Component Value Date   HGBA1C 6.8 (A) 07/12/2022   HGBA1C 8.3 (H) 02/22/2022   HGBA1C 7.2 (A) 07/17/2021   Wt Readings from Last 3 Encounters:  12/07/22 121 lb 11.2 oz (55.2 kg)  07/12/22 121 lb 11.2 oz (55.2 kg)  03/29/22 121 lb 11.2 oz (55.2 kg)   Last seen for diabetes 5 months ago.  Management since then includes continuing 750 XR Metformin . She reports excellent compliance with treatment. She is having side effects. Stomach discomfort  Symptoms: No fatigue No foot ulcerations  No appetite changes No nausea  No paresthesia of the feet  No polydipsia  No polyuria No visual disturbances   No vomiting     Home blood sugar records:  fasting 120-135  Episodes of hypoglycemia? No  Most Recent Eye Exam: 11/23 Current exercise: none Current diet habits: in general, a "healthy" diet    Pertinent Labs: Lab Results  Component Value Date   CHOL 198 02/22/2022   HDL 59 02/22/2022   LDLCALC 109 (H) 02/22/2022   TRIG 174 (H) 02/22/2022   CHOLHDL 3.3 12/12/2019   Lab Results  Component Value Date   NA 138 02/22/2022   K 4.2 02/22/2022   CREATININE 0.98 02/22/2022   EGFR 62 02/22/2022   MICROALBUR negative 02/15/2021   LABMICR 147.0 02/22/2022      ---------------------------------------------------------------------------------------------------   Medications: Outpatient Medications Prior to Visit  Medication Sig   Accu-Chek Softclix Lancets lancets Use as instructed   blood glucose meter kit and supplies Dispense based on patient and insurance preference. Use up to four times daily as directed. (FOR ICD-10 E10.9, E11.9).   calcium-vitamin D (OSCAL WITH D) 500-200 MG-UNIT TABS tablet Take 1 tablet by mouth in the morning and at bedtime.   glucose blood test strip Use as instructed   metFORMIN (GLUCOPHAGE-XR) 750 MG 24 hr tablet Take 1 tablet (750 mg total) by mouth daily with breakfast.   pantoprazole (PROTONIX) 40 MG tablet TAKE 1 TABLET BY MOUTH  TWICE DAILY   rosuvastatin (CRESTOR) 5 MG tablet TAKE 1 TABLET BY MOUTH DAILY   No facility-administered medications prior to visit.    Review of Systems    Objective    BP 105/64 (BP Location: Right Arm, Patient Position: Sitting, Cuff Size: Normal)   Pulse 82   Temp 97.9 F (36.6 C) (Oral)   Wt 121 lb 11.2 oz (55.2 kg)   SpO2 97%   BMI 23.77 kg/m   Physical Exam Vitals and nursing note reviewed.  Constitutional:      General: She is not in acute distress.    Appearance: Normal appearance. She is normal weight. She is not ill-appearing, toxic-appearing or diaphoretic.  HENT:     Head: Normocephalic and atraumatic.  Cardiovascular:  Rate and Rhythm: Normal rate and regular rhythm.     Pulses: Normal pulses.     Heart sounds: Normal heart sounds. No murmur heard.    No friction rub. No gallop.  Pulmonary:     Effort: Pulmonary effort is normal. No respiratory distress.     Breath sounds: Normal breath sounds. No stridor. No wheezing, rhonchi or rales.  Chest:     Chest wall: No tenderness.  Abdominal:     General: Bowel sounds are normal.     Palpations: Abdomen is soft.  Musculoskeletal:        General: Tenderness present. No swelling, deformity or signs of  injury. Normal range of motion.     Right lower leg: No edema.     Left lower leg: No edema.  Skin:    General: Skin is warm and dry.     Capillary Refill: Capillary refill takes less than 2 seconds.     Coloration: Skin is not jaundiced or pale.     Findings: Lesion present. No bruising, erythema or rash.          Comments: Small lipoma on L mid back; appears to have grown per previous US. Pt reports occasionally discomfort with positioning. Defers referral to general surgery at this time to discuss excision   Neurological:     General: No focal deficit present.     Mental Status: She is alert and oriented to person, place, and time. Mental status is at baseline.     Cranial Nerves: No cranial nerve deficit.     Sensory: No sensory deficit.     Motor: No weakness.     Coordination: Coordination normal.  Psychiatric:        Mood and Affect: Mood normal.        Behavior: Behavior normal.        Thought Content: Thought content normal.        Judgment: Judgment normal.     No results found for any visits on 12/07/22.  Assessment & Plan     Problem List Items Addressed This Visit       Endocrine   Type 2 diabetes mellitus with hyperglycemia, without long-term current use of insulin (HCC) - Primary    Chronic, has improved over the last year; elevated from previous visit however, well controlled <8%. Goal of 7.1% Metformin XR 750 mg Denies foot exam today UTD on DM eye exam 10/2022 On crestor 5 mg; previous LDL elevated >100         Other   Chronic pain of left knee    Acute on chronic, known known injury No pain at this time Burning sensation following kneeling; likely MCL strain/pull Discussed referral and follow up- defers at this time       Lipoma of torso    Small lipoma on L mid back; appears to have grown per previous US. ~3-4 cm x 1.5 cm. Pt reports occasionally discomfort with positioning. Defers referral to general surgery at this time to discuss excision       Return in about 3 months (around 03/08/2023) for annual examination.     Vonna Kotyk, FNP, have reviewed all documentation for this visit. The documentation on 12/07/22 for the exam, diagnosis, procedures, and orders are all accurate and complete.  Gwyneth Sprout, [place] [phone] (phone) [phone] (fax)  [place]

## 2022-12-07 NOTE — Assessment & Plan Note (Signed)
Acute on chronic, known known injury No pain at this time Burning sensation following kneeling; likely MCL strain/pull Discussed referral and follow up- defers at this time

## 2022-12-07 NOTE — Assessment & Plan Note (Addendum)
Chronic, has improved over the last year; elevated from previous visit however, well controlled <8%. Goal of 7.1% Metformin XR 750 mg Denies foot exam today UTD on DM eye exam 10/2022 On crestor 5 mg; previous LDL elevated >100

## 2022-12-27 ENCOUNTER — Other Ambulatory Visit: Payer: Self-pay | Admitting: Family Medicine

## 2022-12-28 NOTE — Telephone Encounter (Signed)
Unable to refill per protocol, Rx request is too soon. Last refill 06/21/22 for 90 and 2 refills. Will refuse.  Requested Prescriptions  Pending Prescriptions Disp Refills   pantoprazole (PROTONIX) 40 MG tablet [Pharmacy Med Name: Pantoprazole Sodium 40 MG Oral Tablet Delayed Release] 200 tablet 2    Sig: TAKE 1 TABLET BY MOUTH TWICE  DAILY     Gastroenterology: Proton Pump Inhibitors Passed - 12/27/2022 10:46 PM      Passed - Valid encounter within last 12 months    Recent Outpatient Visits           3 weeks ago Type 2 diabetes mellitus with hyperglycemia, without long-term current use of insulin [facility])   [facility] Tally Joe T, FNP   5 months ago Type 2 diabetes mellitus with hyperglycemia, without long-term current use of insulin [facility])   [facility] Tally Joe T, FNP   9 months ago Type 2 diabetes mellitus with hyperglycemia, without long-term current use of insulin [facility])   [facility] Tally Joe T, FNP   10 months ago Type 2 diabetes mellitus with hyperglycemia, without long-term current use of insulin [facility])   [facility] Gwyneth Sprout, FNP   1 year ago Mass of soft tissue   [facility] Mecum, Dani Gobble, PA-C

## 2023-02-03 ENCOUNTER — Other Ambulatory Visit: Payer: Self-pay | Admitting: Family Medicine

## 2023-02-06 ENCOUNTER — Other Ambulatory Visit: Payer: Self-pay | Admitting: Family Medicine

## 2023-03-04 NOTE — Progress Notes (Unsigned)
I,Sulibeya S Dimas,acting as a Education administrator for Gwyneth Sprout, FNP.,have documented all relevant documentation on the behalf of Gwyneth Sprout, FNP,as directed by  Gwyneth Sprout, FNP while in the presence of Gwyneth Sprout, FNP.  Annual Wellness Visit  Patient: Holly Matthews, Female    DOB: Oct 01, 1951, 72 y.o.   MRN: [id number] Visit Date: 03/07/2023  Today's Provider: Gwyneth Sprout, FNP  Re Introduced to nurse practitioner role and practice setting.  All questions answered.  Discussed provider/patient relationship and expectations.  Chief Complaint  Patient presents with   Medicare Wellness   Subjective    ALLICIA RUPPENTHAL is a 72 y.o. female who presents today for her Annual Wellness Visit. She reports consuming a general and diabetic diet. Home exercise routine includes walking. She generally feels well. She reports sleeping poorly. She does not have additional problems to discuss today.   HPI  Medications: Outpatient Medications Prior to Visit  Medication Sig   Accu-Chek Softclix Lancets lancets Use as instructed   blood glucose meter kit and supplies Dispense based on patient and insurance preference. Use up to four times daily as directed. (FOR ICD-10 E10.9, E11.9).   calcium-vitamin D (OSCAL WITH D) 500-200 MG-UNIT TABS tablet Take 1 tablet by mouth in the morning and at bedtime.   glucose blood test strip Use as instructed   metFORMIN (GLUCOPHAGE-XR) 750 MG 24 hr tablet TAKE 1 TABLET BY MOUTH DAILY  WITH BREAKFAST   pantoprazole (PROTONIX) 40 MG tablet TAKE 1 TABLET BY MOUTH TWICE  DAILY   rosuvastatin (CRESTOR) 5 MG tablet TAKE 1 TABLET BY MOUTH DAILY   No facility-administered medications prior to visit.    Allergies  Allergen Reactions   Codeine     Patient Care Team: Gwyneth Sprout, FNP as PCP - General (Family Medicine)  Review of Systems  All other systems reviewed and are negative.   Last CBC Lab Results  Component Value Date   WBC 4.3 02/22/2022   HGB 13.2  02/22/2022   HCT 40.2 02/22/2022   MCV 85 02/22/2022   MCH 27.8 02/22/2022   RDW 13.8 02/22/2022   PLT 285 [id number]   Last metabolic panel Lab Results  Component Value Date   GLUCOSE 132 (H) 02/22/2022   NA 138 02/22/2022   K 4.2 02/22/2022   CL 102 02/22/2022   CO2 21 02/22/2022   BUN 16 02/22/2022   CREATININE 0.98 02/22/2022   EGFR 62 02/22/2022   CALCIUM 10.0 02/22/2022   PHOS 2.8 (L) 05/28/2019   PROT 7.6 02/22/2022   ALBUMIN 4.4 02/22/2022   LABGLOB 3.2 02/22/2022   AGRATIO 1.4 02/22/2022   BILITOT 0.3 02/22/2022   ALKPHOS 70 02/22/2022   AST 14 02/22/2022   ALT 11 02/22/2022   ANIONGAP 11 10/14/2019   Last lipids Lab Results  Component Value Date   CHOL 198 02/22/2022   HDL 59 02/22/2022   LDLCALC 109 (H) 02/22/2022   TRIG 174 (H) 02/22/2022   CHOLHDL 3.3 12/12/2019   Last hemoglobin A1c Lab Results  Component Value Date   HGBA1C 6.8 (A) 07/12/2022   Last thyroid functions Lab Results  Component Value Date   TSH 1.180 02/22/2022   Last vitamin D Lab Results  Component Value Date   VD25OH 26.1 (L) 02/10/2021   Last vitamin B12 and Folate Lab Results  Component Value Date   VITAMINB12 632 02/10/2021        Objective    Vitals: BP 114/64 (  BP Location: Right Arm, Patient Position: Sitting, Cuff Size: Normal)   Pulse 60   Temp 98.2 F (36.8 C) (Oral)   Resp 16   Ht 4\' 11"  (1.499 m)   Wt 122 lb 8 oz (55.6 kg)   SpO2 100%   BMI 24.74 kg/m   BP Readings from Last 3 Encounters:  03/07/23 114/64  12/07/22 105/64  07/12/22 100/87   Wt Readings from Last 3 Encounters:  03/07/23 122 lb 8 oz (55.6 kg)  12/07/22 121 lb 11.2 oz (55.2 kg)  07/12/22 121 lb 11.2 oz (55.2 kg)   Physical Exam Vitals and nursing note reviewed.  Constitutional:      General: She is awake. She is not in acute distress.    Appearance: Normal appearance. She is well-developed, well-groomed and normal weight. She is not ill-appearing, toxic-appearing or  diaphoretic.  HENT:     Head: Normocephalic and atraumatic.     Jaw: There is normal jaw occlusion. No trismus, tenderness, swelling or pain on movement.     Right Ear: Hearing, tympanic membrane, ear canal and external ear normal. There is no impacted cerumen.     Left Ear: Hearing, tympanic membrane, ear canal and external ear normal. There is no impacted cerumen.     Nose: Nose normal. No congestion or rhinorrhea.     Right Turbinates: Not enlarged, swollen or pale.     Left Turbinates: Not enlarged, swollen or pale.     Right Sinus: No maxillary sinus tenderness or frontal sinus tenderness.     Left Sinus: No maxillary sinus tenderness or frontal sinus tenderness.     Mouth/Throat:     Lips: Pink.     Mouth: Mucous membranes are moist. No injury.     Tongue: No lesions.     Pharynx: Oropharynx is clear. Uvula midline. No pharyngeal swelling, oropharyngeal exudate, posterior oropharyngeal erythema or uvula swelling.     Tonsils: No tonsillar exudate or tonsillar abscesses.  Eyes:     General: Lids are normal. Lids are everted, no foreign bodies appreciated. Vision grossly intact. Gaze aligned appropriately. No allergic shiner or visual field deficit.       Right eye: No discharge.        Left eye: No discharge.     Extraocular Movements: Extraocular movements intact.     Conjunctiva/sclera: Conjunctivae normal.     Right eye: Right conjunctiva is not injected. No exudate.    Left eye: Left conjunctiva is not injected. No exudate.    Pupils: Pupils are equal, round, and reactive to light.  Neck:     Thyroid: No thyroid mass, thyromegaly or thyroid tenderness.     Vascular: No carotid bruit.     Trachea: Trachea normal.  Cardiovascular:     Rate and Rhythm: Normal rate and regular rhythm.     Pulses: Normal pulses.          Carotid pulses are 2+ on the right side and 2+ on the left side.      Radial pulses are 2+ on the right side and 2+ on the left side.       Dorsalis pedis  pulses are 2+ on the right side and 2+ on the left side.       Posterior tibial pulses are 2+ on the right side and 2+ on the left side.     Heart sounds: Normal heart sounds, S1 normal and S2 normal. No murmur heard.    No friction rub. No  gallop.  Pulmonary:     Effort: Pulmonary effort is normal. No respiratory distress.     Breath sounds: Normal breath sounds and air entry. No stridor. No wheezing, rhonchi or rales.  Chest:     Chest wall: No tenderness.  Abdominal:     General: Abdomen is flat. Bowel sounds are normal. There is no distension.     Palpations: Abdomen is soft. There is no mass.     Tenderness: There is no abdominal tenderness. There is no right CVA tenderness, left CVA tenderness, guarding or rebound.     Hernia: No hernia is present.  Genitourinary:    Comments: Exam deferred; denies complaints Musculoskeletal:        General: No swelling, tenderness, deformity or signs of injury. Normal range of motion.     Cervical back: Full passive range of motion without pain, normal range of motion and neck supple. No edema, rigidity or tenderness. No muscular tenderness.     Right lower leg: No edema.     Left lower leg: No edema.  Lymphadenopathy:     Cervical: No cervical adenopathy.     Right cervical: No superficial, deep or posterior cervical adenopathy.    Left cervical: No superficial, deep or posterior cervical adenopathy.  Skin:    General: Skin is warm and dry.     Capillary Refill: Capillary refill takes less than 2 seconds.     Coloration: Skin is not jaundiced or pale.     Findings: No bruising, erythema, lesion or rash.  Neurological:     General: No focal deficit present.     Mental Status: She is alert and oriented to person, place, and time. Mental status is at baseline.     GCS: GCS eye subscore is 4. GCS verbal subscore is 5. GCS motor subscore is 6.     Sensory: Sensation is intact. No sensory deficit.     Motor: Motor function is intact. No weakness.      Coordination: Coordination is intact. Coordination normal.     Gait: Gait is intact. Gait normal.  Psychiatric:        Attention and Perception: Attention and perception normal.        Mood and Affect: Mood and affect normal.        Speech: Speech normal.        Behavior: Behavior normal. Behavior is cooperative.        Thought Content: Thought content normal.        Cognition and Memory: Cognition and memory normal.        Judgment: Judgment normal.    Most recent functional status assessment:    03/07/2023    8:15 AM  In your present state of health, do you have any difficulty performing the following activities:  Hearing? 0  Vision? 0  Difficulty concentrating or making decisions? 0  Walking or climbing stairs? 0  Dressing or bathing? 0  Doing errands, shopping? 0   Most recent fall risk assessment:    03/07/2023    8:14 AM  Fall Risk   Falls in the past year? 0  Number falls in past yr: 0  Injury with Fall? 0  Risk for fall due to : No Fall Risks  Follow up Falls evaluation completed    Most recent depression screenings:    03/07/2023    8:14 AM 07/12/2022    8:06 AM  PHQ 2/9 Scores  PHQ - 2 Score 0 0  PHQ- 9  Score 1    Most recent cognitive screening:    03/07/2023    8:15 AM  6CIT Screen  What Year? 0 points  What month? 0 points  What time? 0 points  Count back from 20 0 points  Months in reverse 0 points  Repeat phrase 0 points  Total Score 0 points   Most recent Audit-C alcohol use screening    03/07/2023    8:15 AM  Alcohol Use Disorder Test (AUDIT)  1. How often do you have a drink containing alcohol? 0  2. How many drinks containing alcohol do you have on a typical day when you are drinking? 0  3. How often do you have six or more drinks on one occasion? 0  AUDIT-C Score 0   A score of 3 or more in women, and 4 or more in men indicates increased risk for alcohol abuse, EXCEPT if all of the points are from question 1   No results found  for any visits on 03/07/23.  Assessment & Plan     Annual wellness visit done today including the all of the following: Reviewed patient's Family Medical History Reviewed and updated list of patient's medical providers Assessment of cognitive impairment was done Assessed patient's functional ability Established a written schedule for health screening [place] Completed and Reviewed  Exercise Activities and Dietary recommendations  Goals      Exercise 3x per week (30 min per time)     Recommend to start back exercising for 3 days a week for 30-45 minutes.         Immunization History  Administered Date(s) Administered   Influenza Split 10/25/2011   Influenza, High Dose Seasonal PF 11/24/2016, 11/25/2017   Influenza,inj,Quad PF,6+ Mos 12/06/2018, 10/03/2019   Moderna Sars-Covid-2 Vaccination 01/02/2020, 01/30/2020   Pneumococcal Conjugate-13 11/24/2016   Pneumococcal Polysaccharide-23 05/16/2018    Health Maintenance  Topic Date Due   DTaP/Tdap/Td (1 - Tdap) Never done   Zoster Vaccines- Shingrix (1 of 2) Never done   FOOT EXAM  12/07/2019   COVID-19 Vaccine (3 - Moderna risk series) 02/27/2020   HEMOGLOBIN A1C  01/12/2023   Diabetic kidney evaluation - eGFR measurement  02/23/2023   Diabetic kidney evaluation - Urine ACR  02/23/2023   INFLUENZA VACCINE  03/20/2023 (Originally 07/20/2022)   OPHTHALMOLOGY EXAM  10/21/2023   COLONOSCOPY (Pts 45-50yrs Insurance coverage will need to be confirmed)  11/07/2023   Medicare Annual Wellness (AWV)  03/06/2024   MAMMOGRAM  04/20/2024   Pneumonia Vaccine 58+ Years old  Completed   DEXA SCAN  Completed   Hepatitis C Screening  Completed   HPV VACCINES  Aged Out     Discussed health benefits of physical activity, and encouraged her to engage in regular exercise appropriate for her age and condition.    Problem List Items Addressed This Visit       Endocrine   Hyperlipidemia associated with type 2  diabetes mellitus ([place])    Chronic, elevated Repeat LP Goal <70 with concurrent DM recommend diet low in saturated fat and regular exercise - 30 min at least 5 times per week Remains on Crestor 5 mg       Relevant Orders   Lipid Panel With LDL/HDL Ratio   Comprehensive metabolic panel   Type 2 diabetes mellitus with hyperglycemia, without long-term current use of insulin (HCC)    Chronic, stable Goal <7% A1c Repeat A1c Reports GI side effects with Metformin XR 750 mg  Continue to recommend balanced, lower carb meals. Smaller meal size, adding snacks. Choosing water as drink of choice and increasing purposeful exercise. Urine micro completed today      Relevant Orders   Hemoglobin A1c   Microalbumin / creatinine urine ratio   Comprehensive metabolic panel     Other   Annual physical exam   Colon cancer screening   Relevant Orders   Ambulatory referral to Gastroenterology   Medicare annual wellness visit, subsequent - Primary    Due for both mammogram and colonoscopy later this year Reports UTD on vision and dental Has been under stress with selling her father's home Things to do to keep yourself healthy  - Exercise at least 30-45 minutes a day, 3-4 days a week.  - Eat a low-fat diet with lots of fruits and vegetables, up to 7-9 servings per day.  - Seatbelts can save your life. Wear them always.  - Smoke detectors on every level of your home, check batteries every year.  - Eye Doctor - have an eye exam every 1-2 years  - Safe sex - if you may be exposed to STDs, use a condom.  - Alcohol -  If you drink, do it moderately, less than 2 drinks per day.  - [place]. Choose someone to speak for you if you are not able.  - Depression is common in our stressful world.If you're feeling down or losing interest in things you normally enjoy, please come in for a visit.  - Violence - If anyone is threatening or hurting you, please call immediately.       Relevant  Orders   Hemoglobin A1c   Lipid Panel With LDL/HDL Ratio   VITAMIN D 25 Hydroxy (Vit-D Deficiency, Fractures)   Microalbumin / creatinine urine ratio   Comprehensive metabolic panel   Screening mammogram for breast cancer   Relevant Orders   MM 3D SCREENING MAMMOGRAM BILATERAL BREAST   Vitamin D deficiency    Chronic, previously low On Vit D supplements with Calcium Repeat Vit D panel      Relevant Orders   VITAMIN D 25 Hydroxy (Vit-D Deficiency, Fractures)   Return in about 6 months (around 09/07/2023) for chonic disease management.    Vonna Kotyk, FNP, have reviewed all documentation for this visit. The documentation on 03/07/23 for the exam, diagnosis, procedures, and orders are all accurate and complete.  Gwyneth Sprout, [place] [phone] (phone) [phone] (fax)  [place]

## 2023-03-07 ENCOUNTER — Ambulatory Visit (INDEPENDENT_AMBULATORY_CARE_PROVIDER_SITE_OTHER): Payer: Medicare Other | Admitting: Family Medicine

## 2023-03-07 ENCOUNTER — Encounter: Payer: Self-pay | Admitting: Family Medicine

## 2023-03-07 VITALS — BP 114/64 | HR 60 | Temp 98.2°F | Resp 16 | Ht 59.0 in | Wt 122.5 lb

## 2023-03-07 DIAGNOSIS — E785 Hyperlipidemia, unspecified: Secondary | ICD-10-CM

## 2023-03-07 DIAGNOSIS — E1165 Type 2 diabetes mellitus with hyperglycemia: Secondary | ICD-10-CM

## 2023-03-07 DIAGNOSIS — Z1211 Encounter for screening for malignant neoplasm of colon: Secondary | ICD-10-CM | POA: Insufficient documentation

## 2023-03-07 DIAGNOSIS — E559 Vitamin D deficiency, unspecified: Secondary | ICD-10-CM

## 2023-03-07 DIAGNOSIS — Z7984 Long term (current) use of oral hypoglycemic drugs: Secondary | ICD-10-CM

## 2023-03-07 DIAGNOSIS — Z0000 Encounter for general adult medical examination without abnormal findings: Secondary | ICD-10-CM

## 2023-03-07 DIAGNOSIS — E1169 Type 2 diabetes mellitus with other specified complication: Secondary | ICD-10-CM

## 2023-03-07 DIAGNOSIS — Z1231 Encounter for screening mammogram for malignant neoplasm of breast: Secondary | ICD-10-CM | POA: Insufficient documentation

## 2023-03-07 NOTE — Assessment & Plan Note (Signed)
Chronic, previously low On Vit D supplements with Calcium Repeat Vit D panel

## 2023-03-07 NOTE — Assessment & Plan Note (Addendum)
Chronic, elevated Repeat LP Goal <70 with concurrent DM recommend diet low in saturated fat and regular exercise - 30 min at least 5 times per week Remains on Crestor 5 mg

## 2023-03-07 NOTE — Assessment & Plan Note (Signed)
Due for both mammogram and colonoscopy later this year Reports UTD on vision and dental Has been under stress with selling her father's home Things to do to keep yourself healthy  - Exercise at least 30-45 minutes a day, 3-4 days a week.  - Eat a low-fat diet with lots of fruits and vegetables, up to 7-9 servings per day.  - Seatbelts can save your life. Wear them always.  - Smoke detectors on every level of your home, check batteries every year.  - Eye Doctor - have an eye exam every 1-2 years  - Safe sex - if you may be exposed to STDs, use a condom.  - Alcohol -  If you drink, do it moderately, less than 2 drinks per day.  - [place]. Choose someone to speak for you if you are not able.  - Depression is common in our stressful world.If you're feeling down or losing interest in things you normally enjoy, please come in for a visit.  - Violence - If anyone is threatening or hurting you, please call immediately.

## 2023-03-07 NOTE — Assessment & Plan Note (Signed)
Chronic, stable Goal <7% A1c Repeat A1c Reports GI side effects with Metformin XR 750 mg Continue to recommend balanced, lower carb meals. Smaller meal size, adding snacks. Choosing water as drink of choice and increasing purposeful exercise. Urine micro completed today

## 2023-03-08 LAB — COMPREHENSIVE METABOLIC PANEL
ALT: 10 IU/L (ref 0–32)
AST: 14 IU/L (ref 0–40)
Albumin/Globulin Ratio: 1.5 (ref 1.2–2.2)
Albumin: 4.4 g/dL (ref 3.8–4.8)
Alkaline Phosphatase: 57 IU/L (ref 44–121)
BUN/Creatinine Ratio: 17 (ref 12–28)
BUN: 16 mg/dL (ref 8–27)
Bilirubin Total: 0.3 mg/dL (ref 0.0–1.2)
CO2: 22 mmol/L (ref 20–29)
Calcium: 9.7 mg/dL (ref 8.7–10.3)
Chloride: 106 mmol/L (ref 96–106)
Creatinine, Ser: 0.95 mg/dL (ref 0.57–1.00)
Globulin, Total: 3 g/dL (ref 1.5–4.5)
Glucose: 127 mg/dL — ABNORMAL HIGH (ref 70–99)
Potassium: 4.3 mmol/L (ref 3.5–5.2)
Sodium: 143 mmol/L (ref 134–144)
Total Protein: 7.4 g/dL (ref 6.0–8.5)
eGFR: 64 mL/min/{1.73_m2} (ref >59)

## 2023-03-08 LAB — VITAMIN D 25 HYDROXY (VIT D DEFICIENCY, FRACTURES): Vit D, 25-Hydroxy: 38.7 ng/mL (ref 30.0–100.0)

## 2023-03-08 LAB — LIPID PANEL WITH LDL/HDL RATIO
Cholesterol, Total: 184 mg/dL (ref 100–199)
HDL: 59 mg/dL (ref >39)
LDL Chol Calc (NIH): 101 mg/dL — ABNORMAL HIGH (ref 0–99)
LDL/HDL Ratio: 1.7 ratio (ref 0.0–3.2)
Triglycerides: 136 mg/dL (ref 0–149)
VLDL Cholesterol Cal: 24 mg/dL (ref 5–40)

## 2023-03-08 LAB — MICROALBUMIN / CREATININE URINE RATIO
Creatinine, Urine: 114.5 mg/dL
Microalb/Creat Ratio: 140 mg/g creat — ABNORMAL HIGH (ref 0–29)
Microalbumin, Urine: 159.8 ug/mL

## 2023-03-08 LAB — HEMOGLOBIN A1C
Est. average glucose Bld gHb Est-mCnc: 166 mg/dL
Hgb A1c MFr Bld: 7.4 % — ABNORMAL HIGH (ref 4.8–5.6)

## 2023-03-08 NOTE — Progress Notes (Signed)
Slight increase again in A1c. Continue to recommend balanced, lower carb meals. Smaller meal size, adding snacks. Choosing water as drink of choice and increasing purposeful exercise. Please let me know your preference to try additional medication vs lifestyle management given noted side effects from metformin.  Cholesterol is improved; however, LDL/bad cholesterol remains above goal of 70 mg/dL. I continue to recommend diet low in saturated fat and regular exercise - 30 min at least 5 times per week. We can increase the Crestor to 2-5 mg (10 mg) tablets a day and recheck in another 2-3 months, if desired vs lifestyle changes alone.  Vit D is improved.  Urine pending.

## 2023-03-15 ENCOUNTER — Encounter: Payer: Self-pay | Admitting: *Deleted

## 2023-04-29 ENCOUNTER — Ambulatory Visit (INDEPENDENT_AMBULATORY_CARE_PROVIDER_SITE_OTHER): Payer: Medicare Other | Admitting: Family Medicine

## 2023-04-29 VITALS — BP 107/59 | HR 92 | Temp 98.3°F | Resp 14 | Ht 59.0 in | Wt 124.1 lb

## 2023-04-29 DIAGNOSIS — H10022 Other mucopurulent conjunctivitis, left eye: Secondary | ICD-10-CM

## 2023-04-29 DIAGNOSIS — J020 Streptococcal pharyngitis: Secondary | ICD-10-CM | POA: Diagnosis not present

## 2023-04-29 MED ORDER — ERYTHROMYCIN 5 MG/GM OP OINT: 1.0000 | TOPICAL_OINTMENT | Freq: Every day | OPHTHALMIC | 0 refills | Status: DC

## 2023-04-29 MED ORDER — AMOXICILLIN-POT CLAVULANATE 875-125 MG PO TABS: 1.0000 | ORAL_TABLET | Freq: Two times a day (BID) | ORAL | 0 refills | Status: DC

## 2023-04-29 NOTE — Progress Notes (Signed)
I,Vanessa  Vital,acting as a Neurosurgeon for Jacky Kindle, FNP.,have documented all relevant documentation on the behalf of Jacky Kindle, FNP,as directed by  Jacky Kindle, FNP while in the presence of Jacky Kindle, FNP.   Established patient visit  Patient: Holly Matthews   DOB: [birth year]   72 y.o. Female  MRN: [id number] Visit Date: 04/29/2023  Today's healthcare provider: Jacky Kindle, FNP   Re Introduced to nurse practitioner role and practice setting.  All questions answered.  Discussed provider/patient relationship and expectations.  Subjective    HPI HPI     Sore Throat    Additional comments: Patient as not taking anything, states sore throat started early this week, states that it has gotten worse. Reports is now feeling congested and has a cough.       Last edited by Lubertha Basque, CMA on 04/29/2023  8:49 AM.      Patient is here today for a sore throat that started beginning of the week. Patient reports it has gotten worse and also now has a cough. Woke up the past two days with Left eye matted shut with discharge.  Medications: Outpatient Medications Prior to Visit  Medication Sig   Accu-Chek Softclix Lancets lancets Use as instructed   blood glucose meter kit and supplies Dispense based on patient and insurance preference. Use up to four times daily as directed. (FOR ICD-10 E10.9, E11.9).   calcium-vitamin D (OSCAL WITH D) 500-200 MG-UNIT TABS tablet Take 1 tablet by mouth in the morning and at bedtime.   glucose blood test strip Use as instructed   metFORMIN (GLUCOPHAGE-XR) 750 MG 24 hr tablet TAKE 1 TABLET BY MOUTH DAILY  WITH BREAKFAST   pantoprazole (PROTONIX) 40 MG tablet TAKE 1 TABLET BY MOUTH TWICE  DAILY   rosuvastatin (CRESTOR) 5 MG tablet TAKE 1 TABLET BY MOUTH DAILY   No facility-administered medications prior to visit.    Review of Systems    Objective    BP (!) 107/59 (BP Location: Right Arm, Patient Position: Sitting, Cuff Size: Normal)    Pulse 92   Temp 98.3 F (36.8 C) (Oral)   Resp 14   Ht 4\' 11"  (1.499 m)   Wt 124 lb 1.6 oz (56.3 kg)   SpO2 100%   BMI 25.07 kg/m   Physical Exam Vitals and nursing note reviewed.  Constitutional:      General: She is not in acute distress.    Appearance: Normal appearance. She is well-developed and normal weight. She is ill-appearing. She is not toxic-appearing or diaphoretic.  HENT:     Head: Normocephalic and atraumatic.     Right Ear: Tympanic membrane and ear canal normal.     Left Ear: Tympanic membrane and ear canal normal.     Nose: No congestion or rhinorrhea.     Mouth/Throat:     Mouth: Mucous membranes are moist.     Tonsils: Tonsillar exudate and tonsillar abscess present. 4+ on the right. 4+ on the left.  Eyes:     Comments: L eye with conjunctival irritation and reports of being matted shut x 2 days   Cardiovascular:     Rate and Rhythm: Normal rate and regular rhythm.     Pulses: Normal pulses.     Heart sounds: Normal heart sounds. No murmur heard.    No friction rub. No gallop.  Pulmonary:     Effort: Pulmonary effort is normal. No respiratory distress.  Breath sounds: Normal breath sounds. No stridor. No wheezing, rhonchi or rales.  Chest:     Chest wall: No tenderness.  Musculoskeletal:        General: No swelling, tenderness, deformity or signs of injury. Normal range of motion.     Right lower leg: No edema.     Left lower leg: No edema.  Skin:    General: Skin is warm and dry.     Capillary Refill: Capillary refill takes less than 2 seconds.     Coloration: Skin is not jaundiced or pale.     Findings: No bruising, erythema, lesion or rash.  Neurological:     General: No focal deficit present.     Mental Status: She is alert and oriented to person, place, and time. Mental status is at baseline.     Cranial Nerves: No cranial nerve deficit.     Sensory: No sensory deficit.     Motor: No weakness.     Coordination: Coordination normal.   Psychiatric:        Mood and Affect: Mood normal.        Behavior: Behavior normal.        Thought Content: Thought content normal.        Judgment: Judgment normal.     No results found for any visits on 04/29/23.  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     Leilani Merl, FNP, have reviewed all documentation for this visit. The documentation on 04/29/23 for the exam, diagnosis, procedures, and orders are all accurate and complete.  Jacky Kindle, FNP  [facility] [phone] (phone) [phone] (fax)  [facility]

## 2023-04-29 NOTE — Assessment & Plan Note (Signed)
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

## 2023-04-29 NOTE — Assessment & Plan Note (Signed)
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

## 2023-05-02 ENCOUNTER — Other Ambulatory Visit: Payer: Self-pay | Admitting: Family Medicine

## 2023-05-02 DIAGNOSIS — E785 Hyperlipidemia, unspecified: Secondary | ICD-10-CM

## 2023-05-03 NOTE — Telephone Encounter (Signed)
Requested Prescriptions  Pending Prescriptions Disp Refills   rosuvastatin (CRESTOR) 5 MG tablet [Pharmacy Med Name: Rosuvastatin Calcium 5 MG Oral Tablet] 100 tablet 2    Sig: TAKE 1 TABLET BY MOUTH DAILY     Cardiovascular:  Antilipid - Statins 2 Failed - 05/02/2023 10:01 PM      Failed - Lipid Panel in normal range within the last 12 months    Cholesterol, Total  Date Value Ref Range Status  03/07/2023 184 100 - 199 mg/dL Final   LDL Cholesterol (Calc)  Date Value Ref Range Status  12/14/2017 113 (H) mg/dL (calc) Final    Comment:    Reference range: <100 . Desirable range <100 mg/dL for primary prevention;   <70 mg/dL for patients with CHD or diabetic patients  with > or = 2 CHD risk factors. Marland Kitchen LDL-C is now calculated using the Martin-Hopkins  calculation, which is a validated novel method providing  better accuracy than the Friedewald equation in the  estimation of LDL-C.  Horald Pollen et al. Lenox Ahr. 4098;119(14): 2061-2068  (http://education.[employer].com/faq/FAQ164)    LDL Chol Calc (NIH)  Date Value Ref Range Status  03/07/2023 101 (H) 0 - 99 mg/dL Final   HDL  Date Value Ref Range Status  03/07/2023 59 >39 mg/dL Final   Triglycerides  Date Value Ref Range Status  03/07/2023 136 0 - 149 mg/dL Final         Passed - Cr in normal range and within 360 days    Creat  Date Value Ref Range Status  12/14/2017 0.76 0.50 - 0.99 mg/dL Final    Comment:    For patients >31 years of age, the reference limit for Creatinine is approximately 13% higher for people identified as African-American. .    Creatinine, Ser  Date Value Ref Range Status  03/07/2023 0.95 0.57 - 1.00 mg/dL Final         Passed - Patient is not pregnant      Passed - Valid encounter within last 12 months    Recent Outpatient Visits           4 days ago Strep pharyngitis   [place] [facility] Merita Norton T, FNP   4 months ago Type 2 diabetes mellitus with  hyperglycemia, without long-term current use of insulin [facility])   [facility] Merita Norton T, FNP   9 months ago Type 2 diabetes mellitus with hyperglycemia, without long-term current use of insulin [facility])   [place] [facility] Merita Norton T, FNP   1 year ago Type 2 diabetes mellitus with hyperglycemia, without long-term current use of insulin [facility])   [place] [facility] Merita Norton T, FNP   1 year ago Type 2 diabetes mellitus with hyperglycemia, without long-term current use of insulin [facility])   [facility] Jacky Kindle, [place]

## 2023-05-24 ENCOUNTER — Ambulatory Visit
Admission: RE | Admit: 2023-05-24 | Discharge: 2023-05-24 | Disposition: A | Discharge status: Home / Self Care | Payer: Medicare Other | Source: Ambulatory Visit | Attending: Family Medicine | Admitting: Family Medicine

## 2023-05-24 DIAGNOSIS — Z1231 Encounter for screening mammogram for malignant neoplasm of breast: Secondary | ICD-10-CM | POA: Diagnosis not present

## 2023-05-25 NOTE — Progress Notes (Signed)
Hi Holly Matthews  Normal mammogram; repeat in 1 year.  Please let us know if you have any questions.  Thank you,  Merita Norton, FNP

## 2023-05-27 ENCOUNTER — Ambulatory Visit: Payer: Self-pay

## 2023-05-27 NOTE — Patient Outreach (Signed)
  Care Coordination   Initial Visit Note   05/27/2023 Name: KARLYNN FURROW MRN: [id number] DOB: 1951/07/06  Roddie Mc Slavens is a 73 y.o. year old female who sees Jacky Kindle, FNP for primary care. I spoke with  Roddie Mc Barsamian by phone today.  What matters to the patients health and wellness today?  CM educated patient on [facility] services.  Patient declines services and feels that she is able to manage her medical needs.  Patient agreed to contact her provider in the future if she needs [facility] services.    Goals Addressed   None     SDOH assessments and interventions completed:  No     Care Coordination Interventions:  No, not indicated   Follow up plan: No further intervention required.   Encounter Outcome:  Pt. Refused

## 2023-06-21 ENCOUNTER — Encounter: Payer: Self-pay | Admitting: Family Medicine

## 2023-06-21 ENCOUNTER — Ambulatory Visit (INDEPENDENT_AMBULATORY_CARE_PROVIDER_SITE_OTHER): Payer: Medicare Other | Admitting: Family Medicine

## 2023-06-21 ENCOUNTER — Ambulatory Visit: Payer: Medicare Other

## 2023-06-21 ENCOUNTER — Ambulatory Visit
Admission: RE | Admit: 2023-06-21 | Discharge: 2023-06-21 | Disposition: A | Discharge status: Home / Self Care | Payer: Medicare Other | Source: Ambulatory Visit | Attending: Family Medicine | Admitting: Family Medicine

## 2023-06-21 VITALS — BP 117/71 | HR 96 | Ht 59.0 in | Wt 123.7 lb

## 2023-06-21 DIAGNOSIS — I700 Atherosclerosis of aorta: Secondary | ICD-10-CM | POA: Diagnosis not present

## 2023-06-21 DIAGNOSIS — K118 Other diseases of salivary glands: Secondary | ICD-10-CM | POA: Diagnosis not present

## 2023-06-21 DIAGNOSIS — H6122 Impacted cerumen, left ear: Secondary | ICD-10-CM | POA: Insufficient documentation

## 2023-06-21 DIAGNOSIS — J351 Hypertrophy of tonsils: Secondary | ICD-10-CM | POA: Insufficient documentation

## 2023-06-21 DIAGNOSIS — G527 Disorders of multiple cranial nerves: Secondary | ICD-10-CM | POA: Insufficient documentation

## 2023-06-21 DIAGNOSIS — E041 Nontoxic single thyroid nodule: Secondary | ICD-10-CM | POA: Diagnosis not present

## 2023-06-21 DIAGNOSIS — E042 Nontoxic multinodular goiter: Secondary | ICD-10-CM | POA: Diagnosis not present

## 2023-06-21 DIAGNOSIS — I6523 Occlusion and stenosis of bilateral carotid arteries: Secondary | ICD-10-CM | POA: Diagnosis not present

## 2023-06-21 LAB — POCT I-STAT CREATININE: Creatinine, Ser: 1 mg/dL (ref 0.44–1.00)

## 2023-06-21 MED ORDER — AZITHROMYCIN 500 MG PO TABS: 500.0000 mg | ORAL_TABLET | Freq: Every day | ORAL | 0 refills | Status: DC

## 2023-06-21 MED ORDER — DOXYCYCLINE HYCLATE 100 MG PO TABS
100.0000 mg | ORAL_TABLET | Freq: Two times a day (BID) | ORAL | 0 refills | Status: AC
Start: 2023-06-21 — End: ?

## 2023-06-21 MED ORDER — IOHEXOL 300 MG/ML  SOLN
75.0000 mL | Freq: Once | INTRAMUSCULAR | Status: AC | PRN
  Administered 2023-06-21: 75 mL via INTRAVENOUS

## 2023-06-21 NOTE — Patient Instructions (Signed)
Please let us know if you are not contacted for your CT scans

## 2023-06-21 NOTE — Assessment & Plan Note (Signed)
Acute; with associated L facial mass c/f parotid gland mass Continue to recommend basic hygiene protocols and use of debrox as needed

## 2023-06-21 NOTE — Assessment & Plan Note (Signed)
3+ week presence of pt reported mass; visible to eye given asymmetry and ongoing concerns with dysphagia  Mom with hx of parotid cancer; recommend follow up CT scan at this time given location of mass

## 2023-06-21 NOTE — Assessment & Plan Note (Signed)
Acute, ongoing complaint since treatment for strep in May; active exudate present again; however, on R tonsil with associated mass on L face near ear

## 2023-06-21 NOTE — Progress Notes (Signed)
Established patient visit   Patient: Holly Matthews   DOB: Dec 13, 1951   72 y.o. Female  MRN: [id number] Visit Date: 06/21/2023  Today's healthcare provider: Jacky Kindle, FNP  Re Introduced to nurse practitioner role and practice setting.  All questions answered.  Discussed provider/patient relationship and expectations.  Chief Complaint  Patient presents with   Mass    Patient reports lump in neck X 2.5 weeks located under her left ear. Patient reports a minor change in size and associated with edema. She reports she was looking in the mirror one day so she started to keep an eye on it and realized it has not gone away.    Subjective    HPI HPI     Mass    Additional comments: Patient reports lump in neck X 2.5 weeks located under her left ear. Patient reports a minor change in size and associated with edema. She reports she was looking in the mirror one day so she started to keep an eye on it and realized it has not gone away.       Last edited by Acey Lav, CMA on 06/21/2023  8:54 AM.       Medications: Outpatient Medications Prior to Visit  Medication Sig   Accu-Chek Softclix Lancets lancets Use as instructed   blood glucose meter kit and supplies Dispense based on patient and insurance preference. Use up to four times daily as directed. (FOR ICD-10 E10.9, E11.9).   calcium-vitamin D (OSCAL WITH D) 500-200 MG-UNIT TABS tablet Take 1 tablet by mouth in the morning and at bedtime.   glucose blood test strip Use as instructed   metFORMIN (GLUCOPHAGE-XR) 750 MG 24 hr tablet TAKE 1 TABLET BY MOUTH DAILY  WITH BREAKFAST   pantoprazole (PROTONIX) 40 MG tablet TAKE 1 TABLET BY MOUTH TWICE  DAILY   rosuvastatin (CRESTOR) 5 MG tablet TAKE 1 TABLET BY MOUTH DAILY   erythromycin ophthalmic ointment Place 1 Application into the left eye at bedtime.   [DISCONTINUED] amoxicillin-clavulanate (AUGMENTIN) 875-125 MG tablet Take 1 tablet by mouth 2 (two) times daily.   No  facility-administered medications prior to visit.    Review of Systems     Objective    BP 117/71 (BP Location: Left Arm, Patient Position: Sitting, Cuff Size: Normal)   Pulse 96   Ht 4\' 11"  (1.499 m)   Wt 123 lb 11.2 oz (56.1 kg)   BMI 24.98 kg/m    Physical Exam Vitals and nursing note reviewed.  Constitutional:      General: She is not in acute distress.    Appearance: Normal appearance. She is overweight. She is not ill-appearing, toxic-appearing or diaphoretic.  HENT:     Head: Normocephalic and atraumatic.     Right Ear: Tympanic membrane, ear canal and external ear normal.     Left Ear: Tympanic membrane, ear canal and external ear normal.     Ears:     Comments: Excessive cerumen L>R    Nose: Nose normal.     Mouth/Throat:     Pharynx: Oropharyngeal exudate and posterior oropharyngeal erythema present.  Neck:     Trachea: Trachea and phonation normal.   Cardiovascular:     Rate and Rhythm: Normal rate and regular rhythm.     Pulses: Normal pulses.     Heart sounds: Normal heart sounds. No murmur heard.    No friction rub. No gallop.  Pulmonary:     Effort: Pulmonary effort  is normal. No respiratory distress.     Breath sounds: Normal breath sounds. No stridor. No wheezing, rhonchi or rales.  Chest:     Chest wall: No tenderness.  Musculoskeletal:        General: No swelling, tenderness, deformity or signs of injury. Normal range of motion.     Cervical back: Full passive range of motion without pain and normal range of motion. Edema present.     Right lower leg: No edema.     Left lower leg: No edema.  Lymphadenopathy:     Cervical: Cervical adenopathy present.     Left cervical: Superficial cervical adenopathy, deep cervical adenopathy and posterior cervical adenopathy present.  Skin:    General: Skin is warm and dry.     Capillary Refill: Capillary refill takes less than 2 seconds.     Coloration: Skin is not jaundiced or pale.     Findings: No  bruising, erythema, lesion or rash.  Neurological:     General: No focal deficit present.     Mental Status: She is alert and oriented to person, place, and time. Mental status is at baseline.     Cranial Nerves: No cranial nerve deficit.     Sensory: No sensory deficit.     Motor: No weakness.     Coordination: Coordination normal.  Psychiatric:        Mood and Affect: Mood normal.        Behavior: Behavior normal.        Thought Content: Thought content normal.        Judgment: Judgment normal.     No results found for any visits on 06/21/23.  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     Leilani Merl, FNP, have reviewed all documentation for this visit. The documentation on 06/21/23 for the exam, diagnosis, procedures, and orders are all accurate and complete.  Jacky Kindle, FNP  [facility] [phone] (phone) [phone] (fax)  [facility]

## 2023-06-24 ENCOUNTER — Ambulatory Visit: Payer: Medicare Other | Admitting: Family Medicine

## 2023-07-11 DIAGNOSIS — R221 Localized swelling, mass and lump, neck: Secondary | ICD-10-CM | POA: Diagnosis not present

## 2023-08-10 ENCOUNTER — Other Ambulatory Visit: Payer: Self-pay | Admitting: Family Medicine

## 2023-08-10 DIAGNOSIS — E1169 Type 2 diabetes mellitus with other specified complication: Secondary | ICD-10-CM

## 2023-08-10 DIAGNOSIS — E785 Hyperlipidemia, unspecified: Secondary | ICD-10-CM

## 2023-09-14 ENCOUNTER — Other Ambulatory Visit: Payer: Self-pay | Admitting: Family Medicine

## 2023-09-19 ENCOUNTER — Ambulatory Visit (INDEPENDENT_AMBULATORY_CARE_PROVIDER_SITE_OTHER): Payer: Medicare Other | Admitting: Family Medicine

## 2023-09-19 ENCOUNTER — Encounter: Payer: Self-pay | Admitting: Family Medicine

## 2023-09-19 VITALS — BP 110/65 | HR 68 | Ht 59.0 in | Wt 124.4 lb

## 2023-09-19 DIAGNOSIS — Z7984 Long term (current) use of oral hypoglycemic drugs: Secondary | ICD-10-CM

## 2023-09-19 DIAGNOSIS — E1165 Type 2 diabetes mellitus with hyperglycemia: Secondary | ICD-10-CM

## 2023-09-19 LAB — POCT GLYCOSYLATED HEMOGLOBIN (HGB A1C): Hemoglobin A1C: 7.5 % — AB (ref 4.0–5.6)

## 2023-09-19 NOTE — Progress Notes (Signed)
Established patient visit   Patient: Holly Matthews   DOB: October 03, 1951   72 y.o. Female  MRN: [id number] Visit Date: 09/19/2023  Today's healthcare provider: Jacky Kindle, FNP  Introduced to nurse practitioner role and practice setting.  All questions answered.  Discussed provider/patient relationship and expectations.  Subjective    HPI HPI     Medical Management of Chronic Issues    Additional comments: 6 month follow-up.       Last edited by Acey Lav, CMA on 09/19/2023  9:39 AM.      Medications: Outpatient Medications Prior to Visit  Medication Sig   Accu-Chek Softclix Lancets lancets USE UP TO FOUR TIMES DAILY AS DIRECTED   blood glucose meter kit and supplies Dispense based on patient and insurance preference. Use up to four times daily as directed. (FOR ICD-10 E10.9, E11.9).   calcium-vitamin D (OSCAL WITH D) 500-200 MG-UNIT TABS tablet Take 1 tablet by mouth in the morning and at bedtime.   glucose blood (ACCU-CHEK GUIDE) test strip USE UP TO FOUR TIMES DAILY AS INSTRUCTED   metFORMIN (GLUCOPHAGE-XR) 750 MG 24 hr tablet TAKE 1 TABLET BY MOUTH DAILY  WITH BREAKFAST   pantoprazole (PROTONIX) 40 MG tablet TAKE 1 TABLET BY MOUTH TWICE  DAILY   rosuvastatin (CRESTOR) 5 MG tablet TAKE 1 TABLET BY MOUTH DAILY   [DISCONTINUED] azithromycin (ZITHROMAX) 500 MG tablet Take 1 tablet (500 mg total) by mouth daily.   [DISCONTINUED] doxycycline (VIBRA-TABS) 100 MG tablet Take 1 tablet (100 mg total) by mouth 2 (two) times daily.   No facility-administered medications prior to visit.     Objective    BP 110/65 (BP Location: Right Arm, Patient Position: Sitting, Cuff Size: Normal)   Pulse 68   Ht 4\' 11"  (1.499 m)   Wt 124 lb 6.4 oz (56.4 kg)   SpO2 98%   BMI 25.13 kg/m   Physical Exam Vitals and nursing note reviewed.  Constitutional:      General: She is not in acute distress.    Appearance: Normal appearance. She is normal weight. She is not ill-appearing,  toxic-appearing or diaphoretic.  HENT:     Head: Normocephalic and atraumatic.  Cardiovascular:     Rate and Rhythm: Normal rate and regular rhythm.     Pulses: Normal pulses.     Heart sounds: Normal heart sounds. No murmur heard.    No friction rub. No gallop.  Pulmonary:     Effort: Pulmonary effort is normal. No respiratory distress.     Breath sounds: Normal breath sounds. No stridor. No wheezing, rhonchi or rales.  Chest:     Chest wall: No tenderness.  Abdominal:     General: Bowel sounds are normal.     Palpations: Abdomen is soft.  Musculoskeletal:        General: No swelling, tenderness, deformity or signs of injury. Normal range of motion.     Right lower leg: No edema.     Left lower leg: No edema.  Skin:    General: Skin is warm and dry.     Capillary Refill: Capillary refill takes less than 2 seconds.     Coloration: Skin is not jaundiced or pale.     Findings: No bruising, erythema, lesion or rash.  Neurological:     General: No focal deficit present.     Mental Status: She is alert and oriented to person, place, and time. Mental status is at baseline.  Cranial Nerves: No cranial nerve deficit.     Sensory: No sensory deficit.     Motor: No weakness.     Coordination: Coordination normal.  Psychiatric:        Mood and Affect: Mood normal.        Behavior: Behavior normal.        Thought Content: Thought content normal.        Judgment: Judgment normal.     Results for orders placed or performed in visit on 09/19/23  POCT glycosylated hemoglobin (Hb A1C)  Result Value Ref Range   Hemoglobin A1C 7.5 (A) 4.0 - 5.6 %   HbA1c POC (<> result, manual entry)     HbA1c, POC (prediabetic range)     HbA1c, POC (controlled diabetic range)      Assessment & Plan     Problem List Items Addressed This Visit       Endocrine   Type 2 diabetes mellitus with hyperglycemia, without long-term current use of insulin (HCC) - Primary    Chronic, now at 7.5% Continue  to recommend balanced, lower carb meals. Smaller meal size, adding snacks. Choosing water as drink of choice and increasing purposeful exercise. Continue on metformin 750 mg with breakfast Goal of <7% for best secondary prevention; defers additional treatment at this time. Plans to continue diet/exercise to assist labs      Relevant Orders   POCT glycosylated hemoglobin (Hb A1C) (Completed)   Return in about 6 months (around 03/18/2024) for annual examination.     Leilani Merl, FNP, have reviewed all documentation for this visit. The documentation on 09/19/23 for the exam, diagnosis, procedures, and orders are all accurate and complete.  Jacky Kindle, FNP  [facility] [phone] (phone) [phone] (fax)  [facility]

## 2023-09-19 NOTE — Assessment & Plan Note (Signed)
Chronic, now at 7.5% Continue to recommend balanced, lower carb meals. Smaller meal size, adding snacks. Choosing water as drink of choice and increasing purposeful exercise. Continue on metformin 750 mg with breakfast Goal of <7% for best secondary prevention; defers additional treatment at this time. Plans to continue diet/exercise to assist labs

## 2023-10-20 ENCOUNTER — Other Ambulatory Visit: Payer: Self-pay | Admitting: Family Medicine

## 2023-10-21 NOTE — Telephone Encounter (Signed)
Requested Prescriptions  Pending Prescriptions Disp Refills   metFORMIN (GLUCOPHAGE-XR) 750 MG 24 hr tablet [Pharmacy Med Name: metFORMIN HCl ER 750 MG Oral Tablet Extended Release 24 Hour] 100 tablet 0    Sig: TAKE 1 TABLET BY MOUTH DAILY  WITH BREAKFAST     Endocrinology:  Diabetes - Biguanides Failed - 10/20/2023 10:29 PM      Failed - B12 Level in normal range and within 720 days    Vitamin B-12  Date Value Ref Range Status  02/10/2021 632 232 - 1,245 pg/mL Final         Failed - CBC within normal limits and completed in the last 12 months    WBC  Date Value Ref Range Status  02/22/2022 4.3 3.4 - 10.8 x10E3/uL Final  10/14/2019 4.2 4.0 - 10.5 K/uL Final   RBC  Date Value Ref Range Status  02/22/2022 4.75 3.77 - 5.28 x10E6/uL Final  10/14/2019 4.84 3.87 - 5.11 MIL/uL Final   Hemoglobin  Date Value Ref Range Status  02/22/2022 13.2 11.1 - 15.9 g/dL Final   Hematocrit  Date Value Ref Range Status  02/22/2022 40.2 34.0 - 46.6 % Final   MCHC  Date Value Ref Range Status  02/22/2022 32.8 31.5 - 35.7 g/dL Final  16/09/9603 54.0 30.0 - 36.0 g/dL Final   [facility]  Date Value Ref Range Status  02/22/2022 27.8 26.6 - 33.0 pg Final  10/14/2019 28.7 26.0 - 34.0 pg Final   MCV  Date Value Ref Range Status  02/22/2022 85 79 - 97 fL Final   No results found for: "PLTCOUNTKUC", "LABPLAT", "POCPLA" RDW  Date Value Ref Range Status  02/22/2022 13.8 11.7 - 15.4 % Final         Passed - Cr in normal range and within 360 days    Creat  Date Value Ref Range Status  12/14/2017 0.76 0.50 - 0.99 mg/dL Final    Comment:    For patients >32 years of age, the reference limit for Creatinine is approximately 13% higher for people identified as African-American. .    Creatinine, Ser  Date Value Ref Range Status  06/21/2023 1.00 0.44 - 1.00 mg/dL Final         Passed - HBA1C is between 0 and 7.9 and within 180 days    Hemoglobin A1C  Date Value Ref Range Status  09/19/2023 7.5  (A) 4.0 - 5.6 % Corrected   Hgb A1c MFr Bld  Date Value Ref Range Status  03/07/2023 7.4 (H) 4.8 - 5.6 % Final    Comment:             Prediabetes: 5.7 - 6.4          Diabetes: >6.4          Glycemic control for adults with diabetes: <7.0          Passed - eGFR in normal range and within 360 days    GFR, Est African American  Date Value Ref Range Status  12/14/2017 95 > OR = 60 mL/min/1.66m2 Final   GFR calc Af Amer  Date Value Ref Range Status  02/02/2021 67 >59 mL/min/1.73 Final    Comment:    **In accordance with recommendations from the NKF-ASN Task force,**   [facility] is in the process of updating its eGFR calculation to the   2021 CKD-EPI creatinine equation that estimates kidney function   without a race variable.    GFR, Est Non African American  Date Value  Ref Range Status  12/14/2017 82 > OR = 60 mL/min/1.37m2 Final   GFR calc non Af Amer  Date Value Ref Range Status  02/02/2021 58 (L) >59 mL/min/1.73 Final   eGFR  Date Value Ref Range Status  03/07/2023 64 >59 mL/min/1.73 Final         Passed - Valid encounter within last 6 months    Recent Outpatient Visits           1 month ago Type 2 diabetes mellitus with hyperglycemia, without long-term current use of insulin [facility])   [facility] Merita Norton T, FNP   4 months ago Mass of left parotid gland   [facility] Jacky Kindle, FNP   5 months ago Strep pharyngitis   [facility] Merita Norton T, FNP   10 months ago Type 2 diabetes mellitus with hyperglycemia, without long-term current use of insulin [facility])   [facility] Merita Norton T, FNP   1 year ago Type 2 diabetes mellitus with hyperglycemia, without long-term current use of insulin [facility])   [facility] Jacky Kindle, [place]

## 2023-10-25 DIAGNOSIS — E119 Type 2 diabetes mellitus without complications: Secondary | ICD-10-CM | POA: Diagnosis not present

## 2023-10-25 DIAGNOSIS — H2513 Age-related nuclear cataract, bilateral: Secondary | ICD-10-CM | POA: Diagnosis not present

## 2023-10-25 DIAGNOSIS — H25013 Cortical age-related cataract, bilateral: Secondary | ICD-10-CM | POA: Diagnosis not present

## 2023-10-25 LAB — HM DIABETES EYE EXAM

## 2023-11-09 ENCOUNTER — Other Ambulatory Visit: Payer: Self-pay | Admitting: Family Medicine

## 2023-11-14 ENCOUNTER — Encounter: Payer: Self-pay | Admitting: Family Medicine

## 2023-12-30 ENCOUNTER — Telehealth: Payer: Self-pay | Admitting: Family Medicine

## 2023-12-30 MED ORDER — METFORMIN HCL ER 750 MG PO TB24: 750.0000 mg | ORAL_TABLET | Freq: Every day | ORAL | 0 refills | Status: DC

## 2023-12-30 NOTE — Telephone Encounter (Signed)
 [facility] faxed refill request for the following medications:   metFORMIN (GLUCOPHAGE-XR) 750 MG 24 hr tablet    Please advise.

## 2024-01-16 DIAGNOSIS — N200 Calculus of kidney: Secondary | ICD-10-CM | POA: Diagnosis not present

## 2024-01-16 DIAGNOSIS — N130 Hydronephrosis with ureteropelvic junction obstruction: Secondary | ICD-10-CM | POA: Diagnosis not present

## 2024-01-16 DIAGNOSIS — R1031 Right lower quadrant pain: Secondary | ICD-10-CM | POA: Diagnosis not present

## 2024-01-18 ENCOUNTER — Telehealth: Payer: Self-pay | Admitting: Family Medicine

## 2024-01-18 NOTE — Telephone Encounter (Signed)
[facility] faxed refill request for the following medications:  pantoprazole (PROTONIX) 40 MG tablet   Please advise.

## 2024-01-18 NOTE — Telephone Encounter (Signed)
Pt requesting refill too soon [facility] 11/09/23 #100 2RF  E-Prescribing Status: Receipt confirmed by pharmacy (11/09/2023  3:31 PM EST)

## 2024-01-28 ENCOUNTER — Ambulatory Visit
Admission: EM | Admit: 2024-01-28 | Discharge: 2024-01-28 | Disposition: A | Discharge status: Home / Self Care | Payer: Medicare Other | Attending: Emergency Medicine | Admitting: Emergency Medicine

## 2024-01-28 DIAGNOSIS — J069 Acute upper respiratory infection, unspecified: Secondary | ICD-10-CM | POA: Diagnosis not present

## 2024-01-28 LAB — POC RSV: RSV Antigen, POC: NEGATIVE

## 2024-01-28 MED ORDER — BENZONATATE 100 MG PO CAPS: 100.0000 mg | ORAL_CAPSULE | Freq: Three times a day (TID) | ORAL | 0 refills | Status: DC | PRN

## 2024-01-28 MED ORDER — AZITHROMYCIN 250 MG PO TABS: 250.0000 mg | ORAL_TABLET | Freq: Every day | ORAL | 0 refills | Status: DC

## 2024-01-28 NOTE — Discharge Instructions (Addendum)
 Take the Zithromax and Tessalon Perles as directed.    Follow up with your primary care provider if your symptoms are not improving.

## 2024-01-28 NOTE — ED Provider Notes (Signed)
 CAY RALPH PELT    CSN: [id number] Arrival date & time: 01/28/24  0831      History   Chief Complaint Chief Complaint  Patient presents with   Cough    HPI Holly Matthews is a 73 y.o. female.  Patient presents with 5-6 day history of postnasal drip, runny nose, cough.  Her cough is worse at night.  Last night she felt short of breath while coughing.  She had a negative at home COVID and flu test this morning.  She has been treating her cough with Robitussin.  No fever, chest pain, vomiting, diarrhea.  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  Her medical history includes diabetes, hyperlipidemia, osteoporosis.  The history is provided by the patient and medical records.    Past Medical History:  Diagnosis Date   Adenomatous polyp 05/06/2015   Had repeat colonoscopy 2014.    Repeat in 5 years.   Allergy    Diabetes mellitus without complication (HCC)    Diet controlled   GERD (gastroesophageal reflux disease)    History of colon polyps 05/06/2015   Hyperlipidemia    Serum cholesterol elevated     Patient Active Problem List   Diagnosis Date Noted   Screening mammogram for breast cancer 03/07/2023   Colon cancer screening 03/07/2023   Chronic pain of left knee 12/07/2022   Vertigo 07/12/2022   Lipoma of torso 07/12/2022   Hyperlipidemia associated with type 2 diabetes mellitus (HCC) 03/15/2022   Hyperlipidemia LDL goal <70 03/15/2022   Type 2 diabetes mellitus with hyperglycemia, without long-term current use of insulin  (HCC) 02/22/2022   Age-related osteoporosis without current pathological fracture 02/22/2022   Tachycardia 02/22/2022   Encounter for screening mammogram for malignant neoplasm of breast 02/22/2022   Medicare annual wellness visit, subsequent 02/22/2022   Annual physical exam 02/22/2022   Gastroesophageal reflux disease with esophagitis without hemorrhage 02/22/2022   Vitamin D  deficiency 04/14/2021   Hypercholesteremia 05/06/2015    Past Surgical History:   Procedure Laterality Date   ABDOMINAL HYSTERECTOMY  1980   APPENDECTOMY  1967   COLONOSCOPY     COLONOSCOPY WITH PROPOFOL  N/A 11/06/2018   Procedure: COLONOSCOPY WITH PROPOFOL ;  Surgeon: Gaylyn Gladis PENNER, MD;  Location: [facility] ENDOSCOPY;  Service: Endoscopy;  Laterality: N/A;   ESOPHAGOGASTRODUODENOSCOPY     ESOPHAGOGASTRODUODENOSCOPY (EGD) WITH PROPOFOL  N/A 11/06/2018   Procedure: ESOPHAGOGASTRODUODENOSCOPY (EGD) WITH PROPOFOL ;  Surgeon: Gaylyn Gladis PENNER, MD;  Location: [facility] ENDOSCOPY;  Service: Endoscopy;  Laterality: N/A;    OB History     Gravida  2   Para  2   Term      Preterm      AB      Living         SAB      IAB      Ectopic      Multiple      Live Births               Home Medications    Prior to Admission medications   Medication Sig Start Date End Date Taking? Authorizing Provider  azithromycin  (ZITHROMAX ) 250 MG tablet Take 1 tablet (250 mg total) by mouth daily. Take first 2 tablets together, then 1 every day until finished. 01/28/24  Yes Corlis Burnard DEL, NP  benzonatate  (TESSALON ) 100 MG capsule Take 1 capsule (100 mg total) by mouth 3 (three) times daily as needed for cough. 01/28/24  Yes Corlis Burnard DEL, NP  Accu-Chek Softclix Lancets lancets USE UP  TO FOUR TIMES DAILY AS DIRECTED 09/14/23   Emilio Colston Pyle DASEN, FNP  blood glucose meter kit and supplies Dispense based on patient and insurance preference. Use up to four times daily as directed. (FOR ICD-10 E10.9, E11.9). 03/12/22   Emilio Heyden Jaber DASEN, FNP  calcium -vitamin D  (OSCAL WITH D) 500-200 MG-UNIT TABS tablet Take 1 tablet by mouth in the morning and at bedtime.    [provider]  glucose blood (ACCU-CHEK GUIDE) test strip USE UP TO FOUR TIMES DAILY AS INSTRUCTED 09/14/23   Emilio Kjirsten Bloodgood DASEN, FNP  metFORMIN  (GLUCOPHAGE -XR) 750 MG 24 hr tablet Take 1 tablet (750 mg total) by mouth daily with breakfast. 12/30/23   Pardue, Lauraine SAILOR, DO  pantoprazole  (PROTONIX ) 40 MG tablet TAKE 1 TABLET BY MOUTH TWICE   DAILY 11/09/23   Emilio Jamarea Selner T, FNP  rosuvastatin  (CRESTOR ) 5 MG tablet TAKE 1 TABLET BY MOUTH DAILY 08/10/23   Emilio Danissa Rundle DASEN, FNP    Family History Family History  Problem Relation Age of Onset   Hypertension Father    CVA Father    Healthy Sister    Healthy Sister    Diabetes Brother    Healthy Brother    Diabetes Son    Colon cancer Maternal Aunt    Breast cancer Neg Hx    Ovarian cancer Neg Hx     Social History Social History   Tobacco Use   Smoking status: Never   Smokeless tobacco: Never  Vaping Use   Vaping status: Never Used  Substance Use Topics   Alcohol use: No   Drug use: No     Allergies   Codeine and Doxycycline    Review of Systems Review of Systems  Constitutional:  Negative for chills and fever.  HENT:  Positive for postnasal drip and rhinorrhea. Negative for ear pain and sore throat.   Respiratory:  Positive for cough and shortness of breath.      Physical Exam Triage Vital Signs ED Triage Vitals  Encounter Vitals Group     BP 01/28/24 0844 110/60     Systolic BP Percentile --      Diastolic BP Percentile --      Pulse Rate 01/28/24 0844 100     Resp 01/28/24 0844 18     Temp 01/28/24 0844 99.1 F (37.3 C)     Temp src --      SpO2 01/28/24 0844 95 %     Weight --      Height --      Head Circumference --      Peak Flow --      Pain Score 01/28/24 0845 0     Pain Loc --      Pain Education --      Exclude from Growth Chart --    No data found.  Updated Vital Signs BP 110/60   Pulse 100   Temp 99.1 F (37.3 C)   Resp 18   SpO2 95%   Visual Acuity Right Eye Distance:   Left Eye Distance:   Bilateral Distance:    Right Eye Near:   Left Eye Near:    Bilateral Near:     Physical Exam Constitutional:      General: She is not in acute distress. HENT:     Right Ear: Tympanic membrane normal.     Left Ear: Tympanic membrane normal.     Nose: Nose normal.     Mouth/Throat:     Mouth: Mucous membranes  are moist.      Pharynx: Oropharynx is clear.     Comments: Clear PND Cardiovascular:     Rate and Rhythm: Normal rate and regular rhythm.     Heart sounds: Normal heart sounds.  Pulmonary:     Effort: Pulmonary effort is normal. No respiratory distress.     Breath sounds: Normal breath sounds.  Neurological:     Mental Status: She is alert.      UC Treatments / Results  Labs (all labs ordered are listed, but only abnormal results are displayed) Labs Reviewed  POC RSV    EKG   Radiology No results found.  Procedures Procedures (including critical care time)  Medications Ordered in UC Medications - No data to display  Initial Impression / Assessment and Plan / UC Course  I have reviewed the triage vital signs and the nursing notes.  Pertinent labs & imaging results that were available during my care of the patient were reviewed by me and considered in my medical decision making (see chart for details).   Acute upper respiratory infection.  No respiratory distress.  O2 sat 95% on room air. Patient had a negative COVID and flu test at home this morning.  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  RSV done here and is negative.  Treating today with Zithromax  and Tessalon  Perles.  Instructed patient to follow-up with her PCP next week if she is not improving.  Education provided on upper respiratory infection.  She agrees to plan of care.  Final Clinical Impressions(s) / UC Diagnoses   Final diagnoses:  Acute upper respiratory infection     Discharge Instructions      Take the Zithromax  and Tessalon  Perles as directed.  Follow-up with your primary care provider if your symptoms are not improving.      ED Prescriptions     Medication Sig Dispense Auth. Provider   azithromycin  (ZITHROMAX ) 250 MG tablet Take 1 tablet (250 mg total) by mouth daily. Take first 2 tablets together, then 1 every day until finished. 6 tablet Corlis Burnard DEL, NP   benzonatate  (TESSALON ) 100 MG capsule  Take 1 capsule (100 mg total) by mouth 3 (three) times daily as needed for cough. 21 capsule Corlis Burnard DEL, NP      PDMP not reviewed this encounter.   Corlis Burnard DEL, NP 01/28/24 [phone]

## 2024-01-28 NOTE — ED Triage Notes (Signed)
 Patient to Urgent Care with complaints of cough. Intermittently productive- clear mucus. Feels like symptoms are worsening. Works with children.   Symptoms started 5-6 days ago. Some SHOB last night. Negative for Covid/ flu this morning.  Meds: Robitussin.

## 2024-02-03 ENCOUNTER — Other Ambulatory Visit: Payer: Self-pay | Admitting: Family Medicine

## 2024-02-03 DIAGNOSIS — K2100 Gastro-esophageal reflux disease with esophagitis, without bleeding: Secondary | ICD-10-CM

## 2024-02-03 MED ORDER — PANTOPRAZOLE SODIUM 40 MG PO TBEC
40.0000 mg | DELAYED_RELEASE_TABLET | Freq: Two times a day (BID) | ORAL | 3 refills | Status: DC
Start: 2024-02-03 — End: 2024-10-08

## 2024-02-13 DIAGNOSIS — K824 Cholesterolosis of gallbladder: Secondary | ICD-10-CM | POA: Diagnosis not present

## 2024-02-13 DIAGNOSIS — Z23 Encounter for immunization: Secondary | ICD-10-CM | POA: Diagnosis not present

## 2024-02-13 DIAGNOSIS — Z860101 Personal history of adenomatous and serrated colon polyps: Secondary | ICD-10-CM | POA: Diagnosis not present

## 2024-02-13 DIAGNOSIS — R1013 Epigastric pain: Secondary | ICD-10-CM | POA: Diagnosis not present

## 2024-02-14 ENCOUNTER — Other Ambulatory Visit: Payer: Self-pay | Admitting: Gastroenterology

## 2024-02-14 DIAGNOSIS — K824 Cholesterolosis of gallbladder: Secondary | ICD-10-CM

## 2024-02-20 ENCOUNTER — Ambulatory Visit
Admission: RE | Admit: 2024-02-20 | Discharge: 2024-02-20 | Disposition: A | Discharge status: Home / Self Care | Payer: Medicare Other | Source: Ambulatory Visit | Attending: Gastroenterology | Admitting: Gastroenterology

## 2024-02-20 DIAGNOSIS — N132 Hydronephrosis with renal and ureteral calculous obstruction: Secondary | ICD-10-CM | POA: Diagnosis not present

## 2024-02-20 DIAGNOSIS — K824 Cholesterolosis of gallbladder: Secondary | ICD-10-CM | POA: Insufficient documentation

## 2024-03-08 ENCOUNTER — Other Ambulatory Visit: Payer: Self-pay | Admitting: Family Medicine

## 2024-03-15 ENCOUNTER — Encounter: Payer: Self-pay | Admitting: Urology

## 2024-03-15 ENCOUNTER — Ambulatory Visit: Admitting: Urology

## 2024-03-15 VITALS — BP 111/57 | Ht 59.0 in | Wt 122.0 lb

## 2024-03-15 DIAGNOSIS — N130 Hydronephrosis with ureteropelvic junction obstruction: Secondary | ICD-10-CM | POA: Diagnosis not present

## 2024-03-15 DIAGNOSIS — R1031 Right lower quadrant pain: Secondary | ICD-10-CM | POA: Diagnosis not present

## 2024-03-15 DIAGNOSIS — N200 Calculus of kidney: Secondary | ICD-10-CM

## 2024-03-15 LAB — URINALYSIS, COMPLETE
Bilirubin, UA: NEGATIVE
Glucose, UA: NEGATIVE
Ketones, UA: NEGATIVE
Nitrite, UA: NEGATIVE
Specific Gravity, UA: 1.025 (ref 1.005–1.030)
Urobilinogen, Ur: 0.2 mg/dL (ref 0.2–1.0)
pH, UA: 5.5 (ref 5.0–7.5)

## 2024-03-15 LAB — MICROSCOPIC EXAMINATION: WBC, UA: >30 /HPF — AB (ref 0–5)

## 2024-03-15 NOTE — Patient Instructions (Signed)
Laser Therapy for Kidney Stones Laser therapy for kidney stones is a procedure to break up rock-like masses that form inside the kidneys (kidney stones). It is done using a device that beams a strong light (laser) on the kidney stones. This breaks the stones up into small pieces. These small pieces may leave your body when you pee (urinate) or may be taken out during the procedure.  You may need laser therapy if you have kidney stones that are painful or that are stopping you from being able to pee. Tell a health care provider about: Any allergies you have. All medicines you are taking, including vitamins, herbs, eye drops, creams, and over-the-counter medicines. Any problems you or family members have had with anesthesia. Any bleeding problems you have. Any surgeries you have had. Any medical conditions you have. Whether you are pregnant or may be pregnant. What are the risks? Your health care provider will talk with you about risks. These may include: Infection. Bleeding. Allergic reactions to medicines. Damage to: The part of your body that drains pee (urine) from the bladder (urethra). The bladder. The tube that connects the bladder to the kidneys (ureter). Urinary tract infection (UTI). Urethral stricture. This is when the urethra is narrowed by scarring. Trouble peeing. Blockage of the kidney. This may be caused by a piece of kidney stone. What happens before the procedure? When to stop eating and drinking Follow instructions from your provider about what you may eat and drink. These may include: 8 hours before the procedure Stop eating most foods. Do not eat meat, fried foods, or fatty foods. Eat only light foods, such as toast or crackers. All liquids are okay except energy drinks and alcohol. 6 hours before the procedure Stop eating. Drink only clear liquids, such as water, clear fruit juice, black coffee, plain tea, and sports drinks. Do not drink energy drinks or  alcohol. 2 hours before the procedure Stop drinking all liquids. You may be allowed to take medicines with small sips of water. If you do not follow your provider's instructions, your procedure may be delayed or canceled. Medicines Ask your provider about: Changing or stopping your regular medicines. These include any diabetes medicines or blood thinners you take. Taking medicines such as aspirin and ibuprofen. These medicines can thin your blood. Do not take them unless your provider tells you to. Taking over-the-counter medicines, vitamins, herbs, and supplements. Tests You may have a physical exam before the procedure. You may also have tests done. These may include: Imaging tests. Blood or pee tests. Surgery safety Ask your provider: How your surgery site will be marked. What steps will be taken to help prevent infection. These steps may include: Removing hair at the surgery site. Washing skin with a soap that kills germs. Taking antibiotics. General instructions Do not use any products that contain nicotine or tobacco for at least 4 weeks before the procedure. These products include cigarettes, chewing tobacco, and vaping devices, such as e-cigarettes. If you need help quitting, ask your provider. If you will be going home right after the procedure, plan to have a responsible adult: Take you home from the hospital or clinic. You will not be allowed to drive. Care for you for the time you are told. What happens during the procedure?  An IV will be inserted into one of your veins. You will be given: A sedative. This helps you relax. Anesthesia. This keeps you from feeling pain. It will make you fall asleep for surgery. A tool  with a camera on the end (ureteroscope) will be put into your urethra. It will be moved through your bladder to your kidney. It will send pictures to a screen in the operating room. This will show what parts of your kidney need to be treated. A tube will be  put through the ureteroscope. It will be moved into your kidney. The laser device will be put into your kidney through the tube. The laser will be used to break up the kidney stones. A tool with a tiny wire basket may be put through the tube into your kidney. This can help remove the small pieces of the kidney stone. A small mesh tube (stent) may be placed to allow your kidney to drain. The tube and ureteroscope will be taken out at the end of the surgery. The procedure may vary among providers and hospitals. What happens after the procedure? Your blood pressure, heart rate, breathing rate, and blood oxygen level will be monitored until you leave the hospital or clinic. If you had a stent placed, it may have a string that will be secured to your skin. This helps your provider remove the stent. You may be given a strainer to collect any stone pieces that you pass in your pee. Your provider may have these tested. This information is not intended to replace advice given to you by your health care provider. Make sure you discuss any questions you have with your health care provider. Document Revised: 08/06/2022 Document Reviewed: 08/06/2022 Elsevier Patient Education  2024 [employer].

## 2024-03-15 NOTE — Progress Notes (Signed)
 03/15/24 2:01 PM   Holly Matthews 05-08-1951 [id number]  CC: Right hydronephrosis, right-sided nephrolithiasis  HPI: 73 year old female who underwent a abdominal ultrasound for reported gallbladder polyps with incidental finding of large right renal stones with mild right hydronephrosis.  There is no recent cross-sectional imaging to review.  She denies any flank pain, no gross hematuria, no urinary symptoms.  She does have a prior history of kidney stones that she passed spontaneously.  No recent renal function to review, but she has upcoming lab work on April 5 with her PCP.   PMH: Past Medical History:  Diagnosis Date   Adenomatous polyp 05/06/2015   Had repeat colonoscopy 2014.    Repeat in 5 years.   Allergy    Diabetes mellitus without complication (HCC)    Diet controlled   GERD (gastroesophageal reflux disease)    History of colon polyps 05/06/2015   Hyperlipidemia    Serum cholesterol elevated     Surgical History: Past Surgical History:  Procedure Laterality Date   ABDOMINAL HYSTERECTOMY  1980   APPENDECTOMY  1967   COLONOSCOPY     COLONOSCOPY WITH PROPOFOL N/A 11/06/2018   Procedure: COLONOSCOPY WITH PROPOFOL;  Surgeon: Christena Deem, MD;  Location: [facility] ENDOSCOPY;  Service: Endoscopy;  Laterality: N/A;   ESOPHAGOGASTRODUODENOSCOPY     ESOPHAGOGASTRODUODENOSCOPY (EGD) WITH PROPOFOL N/A 11/06/2018   Procedure: ESOPHAGOGASTRODUODENOSCOPY (EGD) WITH PROPOFOL;  Surgeon: Christena Deem, MD;  Location: [facility] ENDOSCOPY;  Service: Endoscopy;  Laterality: N/A;    Family History: Family History  Problem Relation Age of Onset   Hypertension Father    CVA Father    Healthy Sister    Healthy Sister    Diabetes Brother    Healthy Brother    Diabetes Son    Colon cancer Maternal Aunt    Breast cancer Neg Hx    Ovarian cancer Neg Hx     Social History:  reports that she has never smoked. She has never used smokeless tobacco. She reports that she does not  drink alcohol and does not use drugs.  Physical Exam: BP (!) 111/57   Ht 4\' 11"  (1.499 m)   Wt 122 lb (55.3 kg)   BMI 24.64 kg/m    Constitutional:  Alert and oriented, No acute distress. Cardiovascular: No clubbing, cyanosis, or edema. Respiratory: Normal respiratory effort, no increased work of breathing. GI: Abdomen is soft, nontender, nondistended, no abdominal masses   Pertinent Imaging: I have personally viewed and interpreted the abdominal ultrasound showing possible right-sided renal stones and mild hydronephrosis  Assessment & Plan:   73 year old female with prior history of stones, recent abdominal ultrasound for gallbladder polyps with incidental finding of reported large right renal stones and possible mild right hydronephrosis.  She is asymptomatic, no recent renal function to review, and she has upcoming blood work next week with her PCP.  We discussed various treatment options for urolithiasis including observation with or without medical expulsive therapy, shockwave lithotripsy (SWL), ureteroscopy and laser lithotripsy with stent placement, and percutaneous nephrolithotomy.We discussed that management is based on stone size, location, density, patient co-morbidities, and patient preference. Stones <73mm in size have a >80% spontaneous passage rate. Data surrounding the use of tamsulosin for medical expulsive therapy is controversial, but meta analyses suggests it is most efficacious for distal stones between 5-74mm in size. Possible side effects include dizziness/lightheadedness, and retrograde ejaculation.SWL has a lower stone free rate in a single procedure, but also a lower complication rate compared to ureteroscopy  and avoids a stent and associated stent related symptoms. Possible complications include renal hematoma, steinstrasse, and need for additional treatment.Ureteroscopy with laser lithotripsy and stent placement has a higher stone free rate than SWL in a single  procedure, however increased complication rate including possible infection, ureteral injury, bleeding, and stent related morbidity. Common stent related symptoms include dysuria, urgency/frequency, and flank pain.PCNL is the favored treatment for stones >2cm. It involves a small incision in the flank, with complete fragmentation of stones and removal. It has the highest stone free rate, but also the highest complication rate. Possible complications include bleeding, infection/sepsis, injury to surrounding organs including the pleura, and collecting system injury.   CT stone protocol for further evaluation of reported right sided renal stones Consider ureteroscopy and laser lithotripsy pending CT results  Legrand Rams, MD 03/15/2024  [facility] [address], [address] [place], [place] [address] [phone]

## 2024-03-19 ENCOUNTER — Encounter: Payer: Medicare Other | Admitting: Family Medicine

## 2024-03-19 LAB — CULTURE, URINE COMPREHENSIVE

## 2024-03-23 ENCOUNTER — Inpatient Hospital Stay
Admission: EM | Admit: 2024-03-23 | Discharge: 2024-03-25 | DRG: 871 | Disposition: A | Discharge status: Home / Self Care | Attending: Osteopathic Medicine | Admitting: Osteopathic Medicine

## 2024-03-23 ENCOUNTER — Other Ambulatory Visit: Payer: Self-pay

## 2024-03-23 ENCOUNTER — Inpatient Hospital Stay

## 2024-03-23 ENCOUNTER — Emergency Department

## 2024-03-23 ENCOUNTER — Ambulatory Visit
Admission: EM | Admit: 2024-03-23 | Discharge: 2024-03-23 | Disposition: A | Discharge status: Home / Self Care | Attending: Emergency Medicine | Admitting: Emergency Medicine

## 2024-03-23 DIAGNOSIS — D509 Iron deficiency anemia, unspecified: Secondary | ICD-10-CM | POA: Diagnosis present

## 2024-03-23 DIAGNOSIS — R0902 Hypoxemia: Secondary | ICD-10-CM

## 2024-03-23 DIAGNOSIS — R509 Fever, unspecified: Secondary | ICD-10-CM

## 2024-03-23 DIAGNOSIS — N132 Hydronephrosis with renal and ureteral calculous obstruction: Secondary | ICD-10-CM | POA: Diagnosis not present

## 2024-03-23 DIAGNOSIS — E119 Type 2 diabetes mellitus without complications: Secondary | ICD-10-CM

## 2024-03-23 DIAGNOSIS — J189 Pneumonia, unspecified organism: Principal | ICD-10-CM | POA: Diagnosis present

## 2024-03-23 DIAGNOSIS — N200 Calculus of kidney: Secondary | ICD-10-CM

## 2024-03-23 DIAGNOSIS — K219 Gastro-esophageal reflux disease without esophagitis: Secondary | ICD-10-CM | POA: Diagnosis present

## 2024-03-23 DIAGNOSIS — E1122 Type 2 diabetes mellitus with diabetic chronic kidney disease: Secondary | ICD-10-CM

## 2024-03-23 DIAGNOSIS — A419 Sepsis, unspecified organism: Principal | ICD-10-CM | POA: Diagnosis present

## 2024-03-23 DIAGNOSIS — Z79899 Other long term (current) drug therapy: Secondary | ICD-10-CM

## 2024-03-23 DIAGNOSIS — E7800 Pure hypercholesterolemia, unspecified: Secondary | ICD-10-CM | POA: Diagnosis not present

## 2024-03-23 DIAGNOSIS — Z881 Allergy status to other antibiotic agents status: Secondary | ICD-10-CM

## 2024-03-23 DIAGNOSIS — J9601 Acute respiratory failure with hypoxia: Secondary | ICD-10-CM | POA: Diagnosis not present

## 2024-03-23 DIAGNOSIS — Z794 Long term (current) use of insulin: Secondary | ICD-10-CM | POA: Diagnosis not present

## 2024-03-23 DIAGNOSIS — N390 Urinary tract infection, site not specified: Secondary | ICD-10-CM | POA: Diagnosis not present

## 2024-03-23 DIAGNOSIS — R059 Cough, unspecified: Secondary | ICD-10-CM | POA: Diagnosis not present

## 2024-03-23 DIAGNOSIS — Z800 Family history of malignant neoplasm of digestive organs: Secondary | ICD-10-CM

## 2024-03-23 DIAGNOSIS — N1330 Unspecified hydronephrosis: Secondary | ICD-10-CM | POA: Diagnosis not present

## 2024-03-23 DIAGNOSIS — Z885 Allergy status to narcotic agent status: Secondary | ICD-10-CM | POA: Diagnosis not present

## 2024-03-23 DIAGNOSIS — N182 Chronic kidney disease, stage 2 (mild): Secondary | ICD-10-CM | POA: Diagnosis present

## 2024-03-23 DIAGNOSIS — R6520 Severe sepsis without septic shock: Secondary | ICD-10-CM | POA: Diagnosis not present

## 2024-03-23 DIAGNOSIS — K449 Diaphragmatic hernia without obstruction or gangrene: Secondary | ICD-10-CM | POA: Diagnosis not present

## 2024-03-23 DIAGNOSIS — Z90710 Acquired absence of both cervix and uterus: Secondary | ICD-10-CM

## 2024-03-23 DIAGNOSIS — Z823 Family history of stroke: Secondary | ICD-10-CM

## 2024-03-23 DIAGNOSIS — R0602 Shortness of breath: Secondary | ICD-10-CM

## 2024-03-23 DIAGNOSIS — Z7984 Long term (current) use of oral hypoglycemic drugs: Secondary | ICD-10-CM

## 2024-03-23 DIAGNOSIS — Z1152 Encounter for screening for COVID-19: Secondary | ICD-10-CM | POA: Diagnosis not present

## 2024-03-23 DIAGNOSIS — Z8249 Family history of ischemic heart disease and other diseases of the circulatory system: Secondary | ICD-10-CM | POA: Diagnosis not present

## 2024-03-23 DIAGNOSIS — R000 Tachycardia, unspecified: Secondary | ICD-10-CM | POA: Diagnosis not present

## 2024-03-23 DIAGNOSIS — Z833 Family history of diabetes mellitus: Secondary | ICD-10-CM | POA: Diagnosis not present

## 2024-03-23 DIAGNOSIS — I5032 Chronic diastolic (congestive) heart failure: Secondary | ICD-10-CM | POA: Diagnosis present

## 2024-03-23 DIAGNOSIS — I5030 Unspecified diastolic (congestive) heart failure: Secondary | ICD-10-CM | POA: Diagnosis present

## 2024-03-23 DIAGNOSIS — R918 Other nonspecific abnormal finding of lung field: Secondary | ICD-10-CM | POA: Diagnosis not present

## 2024-03-23 HISTORY — DX: Pneumonia, unspecified organism: J18.9

## 2024-03-23 HISTORY — DX: Sepsis, unspecified organism: A41.9

## 2024-03-23 HISTORY — DX: Acute respiratory failure with hypoxia: J96.01

## 2024-03-23 LAB — HEPATIC FUNCTION PANEL
ALT: 11 U/L (ref 0–44)
AST: 14 U/L — ABNORMAL LOW (ref 15–41)
Albumin: 3.6 g/dL (ref 3.5–5.0)
Alkaline Phosphatase: 47 U/L (ref 38–126)
Bilirubin, Direct: <0.1 mg/dL (ref 0.0–0.2)
Total Bilirubin: 0.8 mg/dL (ref 0.0–1.2)
Total Protein: 7.7 g/dL (ref 6.5–8.1)

## 2024-03-23 LAB — PROTIME-INR
INR: 1.1 (ref 0.8–1.2)
Prothrombin Time: 14.8 s (ref 11.4–15.2)

## 2024-03-23 LAB — URINALYSIS, W/ REFLEX TO CULTURE (INFECTION SUSPECTED)
Bilirubin Urine: NEGATIVE
Glucose, UA: NEGATIVE mg/dL
Ketones, ur: NEGATIVE mg/dL
Nitrite: NEGATIVE
Protein, ur: 100 mg/dL — AB
Specific Gravity, Urine: 1.017 (ref 1.005–1.030)
WBC, UA: >50 WBC/hpf (ref 0–5)
pH: 5 (ref 5.0–8.0)

## 2024-03-23 LAB — RESP PANEL BY RT-PCR (RSV, FLU A&B, COVID)  RVPGX2
Influenza A by PCR: NEGATIVE
Influenza B by PCR: NEGATIVE
Resp Syncytial Virus by PCR: NEGATIVE
SARS Coronavirus 2 by RT PCR: NEGATIVE

## 2024-03-23 LAB — CBG MONITORING, ED: Glucose-Capillary: 146 mg/dL — ABNORMAL HIGH (ref 70–99)

## 2024-03-23 LAB — CBC
HCT: 31 % — ABNORMAL LOW (ref 36.0–46.0)
Hemoglobin: 9.9 g/dL — ABNORMAL LOW (ref 12.0–15.0)
MCH: 25.1 pg — ABNORMAL LOW (ref 26.0–34.0)
MCHC: 31.9 g/dL (ref 30.0–36.0)
MCV: 78.7 fL — ABNORMAL LOW (ref 80.0–100.0)
Platelets: 267 10*3/uL (ref 150–400)
RBC: 3.94 MIL/uL (ref 3.87–5.11)
RDW: 16.9 % — ABNORMAL HIGH (ref 11.5–15.5)
WBC: 7.6 10*3/uL (ref 4.0–10.5)
nRBC: 0 % (ref 0.0–0.2)

## 2024-03-23 LAB — BASIC METABOLIC PANEL WITH GFR
Anion gap: 9 (ref 5–15)
BUN: 15 mg/dL (ref 8–23)
CO2: 23 mmol/L (ref 22–32)
Calcium: 9.2 mg/dL (ref 8.9–10.3)
Chloride: 105 mmol/L (ref 98–111)
Creatinine, Ser: 1.13 mg/dL — ABNORMAL HIGH (ref 0.44–1.00)
GFR, Estimated: 51 mL/min — ABNORMAL LOW (ref >60)
Glucose, Bld: 161 mg/dL — ABNORMAL HIGH (ref 70–99)
Potassium: 3.7 mmol/L (ref 3.5–5.1)
Sodium: 137 mmol/L (ref 135–145)

## 2024-03-23 LAB — BRAIN NATRIURETIC PEPTIDE: B Natriuretic Peptide: 12 pg/mL (ref 0.0–100.0)

## 2024-03-23 LAB — LACTIC ACID, PLASMA
Lactic Acid, Venous: 1.3 mmol/L (ref 0.5–1.9)
Lactic Acid, Venous: 1.1 mmol/L (ref 0.5–1.9)

## 2024-03-23 LAB — TROPONIN I (HIGH SENSITIVITY): Troponin I (High Sensitivity): 3 ng/L (ref <18)

## 2024-03-23 LAB — APTT: aPTT: 33 s (ref 24–36)

## 2024-03-23 MED ORDER — LACTATED RINGERS IV BOLUS
500.0000 mL | Freq: Once | INTRAVENOUS | Status: AC
  Administered 2024-03-23: 500 mL via INTRAVENOUS

## 2024-03-23 MED ORDER — ALBUTEROL SULFATE (2.5 MG/3ML) 0.083% IN NEBU: 2.5000 mg | INHALATION_SOLUTION | RESPIRATORY_TRACT | Status: DC | PRN

## 2024-03-23 MED ORDER — IBUPROFEN 400 MG PO TABS
200.0000 mg | ORAL_TABLET | Freq: Once | ORAL | Status: DC
  Filled 2024-03-23: qty 1

## 2024-03-23 MED ORDER — ALBUTEROL SULFATE (2.5 MG/3ML) 0.083% IN NEBU
2.5000 mg | INHALATION_SOLUTION | RESPIRATORY_TRACT | Status: AC
  Administered 2024-03-23: 2.5 mg via RESPIRATORY_TRACT

## 2024-03-23 MED ORDER — ONDANSETRON HCL 4 MG/2ML IJ SOLN: 4.0000 mg | Freq: Three times a day (TID) | INTRAMUSCULAR | Status: DC | PRN

## 2024-03-23 MED ORDER — DM-GUAIFENESIN ER 30-600 MG PO TB12
1.0000 | ORAL_TABLET | Freq: Two times a day (BID) | ORAL | Status: DC | PRN
  Administered 2024-03-24: 1 via ORAL
  Filled 2024-03-23 (×2): qty 1

## 2024-03-23 MED ORDER — SODIUM CHLORIDE 0.9 % IV SOLN
500.0000 mg | INTRAVENOUS | Status: DC
  Filled 2024-03-23: qty 5

## 2024-03-23 MED ORDER — SODIUM CHLORIDE 0.9 % IV SOLN
2.0000 g | INTRAVENOUS | Status: DC
Start: 2024-03-24 — End: 2024-03-25
  Administered 2024-03-24: 2 g via INTRAVENOUS
  Filled 2024-03-23 (×2): qty 20

## 2024-03-23 MED ORDER — INSULIN ASPART 100 UNIT/ML IJ SOLN
0.0000 [IU] | Freq: Three times a day (TID) | INTRAMUSCULAR | Status: DC
  Administered 2024-03-24: 3 [IU] via SUBCUTANEOUS
  Administered 2024-03-24: 2 [IU] via SUBCUTANEOUS
  Administered 2024-03-24: 3 [IU] via SUBCUTANEOUS
  Administered 2024-03-25: 4 [IU] via SUBCUTANEOUS
  Administered 2024-03-25: 2 [IU] via SUBCUTANEOUS
  Filled 2024-03-23 (×6): qty 1

## 2024-03-23 MED ORDER — ROSUVASTATIN CALCIUM 5 MG PO TABS
5.0000 mg | ORAL_TABLET | Freq: Every day | ORAL | Status: DC
  Administered 2024-03-24 – 2024-03-25 (×2): 5 mg via ORAL
  Filled 2024-03-23 (×3): qty 1

## 2024-03-23 MED ORDER — HEPARIN SODIUM (PORCINE) 5000 UNIT/ML IJ SOLN: 5000.0000 [IU] | Freq: Three times a day (TID) | INTRAMUSCULAR | Status: DC

## 2024-03-23 MED ORDER — OYSTER SHELL CALCIUM/D3 500-5 MG-MCG PO TABS
1.0000 | ORAL_TABLET | Freq: Two times a day (BID) | ORAL | Status: DC
  Administered 2024-03-23 – 2024-03-25 (×4): 1 via ORAL
  Filled 2024-03-23 (×4): qty 1

## 2024-03-23 MED ORDER — METHYLPREDNISOLONE SODIUM SUCC 125 MG IJ SOLR
80.0000 mg | Freq: Every day | INTRAMUSCULAR | Status: DC
  Administered 2024-03-23 – 2024-03-24 (×2): 80 mg via INTRAVENOUS
  Filled 2024-03-23 (×2): qty 2

## 2024-03-23 MED ORDER — INSULIN ASPART 100 UNIT/ML IJ SOLN
0.0000 [IU] | Freq: Every day | INTRAMUSCULAR | Status: DC
  Administered 2024-03-24: 2 [IU] via SUBCUTANEOUS
  Filled 2024-03-23: qty 1

## 2024-03-23 MED ORDER — PANTOPRAZOLE SODIUM 40 MG PO TBEC
40.0000 mg | DELAYED_RELEASE_TABLET | Freq: Two times a day (BID) | ORAL | Status: DC
  Administered 2024-03-24 – 2024-03-25 (×3): 40 mg via ORAL
  Filled 2024-03-23 (×3): qty 1

## 2024-03-23 MED ORDER — IBUPROFEN 600 MG PO TABS
600.0000 mg | ORAL_TABLET | Freq: Once | ORAL | Status: AC
  Administered 2024-03-23: 600 mg via ORAL

## 2024-03-23 MED ORDER — LACTATED RINGERS IV BOLUS (SEPSIS)
500.0000 mL | Freq: Once | INTRAVENOUS | Status: AC
  Administered 2024-03-23: 500 mL via INTRAVENOUS

## 2024-03-23 MED ORDER — LACTATED RINGERS IV BOLUS
1000.0000 mL | Freq: Once | INTRAVENOUS | Status: AC
  Administered 2024-03-23: 1000 mL via INTRAVENOUS

## 2024-03-23 MED ORDER — LACTATED RINGERS IV SOLN: INTRAVENOUS | Status: DC

## 2024-03-23 MED ORDER — ACETAMINOPHEN 325 MG PO TABS: 650.0000 mg | ORAL_TABLET | Freq: Four times a day (QID) | ORAL | Status: DC | PRN

## 2024-03-23 MED ORDER — SODIUM CHLORIDE 0.9 % IV SOLN
2.0000 g | Freq: Once | INTRAVENOUS | Status: AC
  Administered 2024-03-23: 2 g via INTRAVENOUS
  Filled 2024-03-23: qty 20

## 2024-03-23 MED ORDER — SODIUM CHLORIDE 0.9 % IV SOLN
500.0000 mg | Freq: Once | INTRAVENOUS | Status: AC
  Administered 2024-03-23: 500 mg via INTRAVENOUS
  Filled 2024-03-23: qty 5

## 2024-03-23 NOTE — ED Triage Notes (Signed)
 Patient to Urgent Care with complaints of dry cough/ nasal congestion/ shortness of breath.  Symptoms started approx one week ago. Worse over the last two days.   Has been taking delsym/ otc allergy medication/ throat lozenges.

## 2024-03-23 NOTE — Consult Note (Signed)
 CODE SEPSIS - PHARMACY COMMUNICATION  **Broad Spectrum Antibiotics should be administered within 1 hour of Sepsis diagnosis**  Time Code Sepsis Called/Page Received: 1656  Antibiotics Ordered: ceftriaxone  Time of 1st antibiotic administration: 1727  Additional action taken by pharmacy: None      Ronnald Ramp ,PharmD Clinical Pharmacist  03/23/2024  5:31 PM

## 2024-03-23 NOTE — ED Notes (Signed)
Pt ambulated to bathroom with strong steady gait.  ?

## 2024-03-23 NOTE — ED Notes (Signed)
EMS contacted  

## 2024-03-23 NOTE — ED Notes (Signed)
 Central monitoring called and cardiac monitoring initiated

## 2024-03-23 NOTE — ED Notes (Signed)
 EMS at bedside

## 2024-03-23 NOTE — ED Notes (Signed)
 CCMD called to place pt on monitor

## 2024-03-23 NOTE — ED Notes (Signed)
 Pt given box lunch.

## 2024-03-23 NOTE — ED Provider Notes (Signed)
 Holly Matthews    CSN: [id number] Arrival date & time: 03/23/24  1354      History   Chief Complaint Chief Complaint  Patient presents with   Cough    HPI Holly Matthews is a 73 y.o. female.   Patient presents with 1 week history of congestion, cough, shortness of breath.  Her symptoms are worse x 2 days.  Treatment attempted with OTC cough syrup and allergy medication.  She denies fever.  No history of respiratory disease.  The history is provided by the patient and medical records.    Past Medical History:  Diagnosis Date   Adenomatous polyp 05/06/2015   Had repeat colonoscopy 2014.    Repeat in 5 years.   Allergy    Diabetes mellitus without complication (HCC)    Diet controlled   GERD (gastroesophageal reflux disease)    History of colon polyps 05/06/2015   Hyperlipidemia    Serum cholesterol elevated     Patient Active Problem List   Diagnosis Date Noted   Screening mammogram for breast cancer 03/07/2023   Colon cancer screening 03/07/2023   Chronic pain of left knee 12/07/2022   Vertigo 07/12/2022   Lipoma of torso 07/12/2022   Hyperlipidemia associated with type 2 diabetes mellitus (HCC) 03/15/2022   Hyperlipidemia LDL goal <70 03/15/2022   Type 2 diabetes mellitus with hyperglycemia, without long-term current use of insulin (HCC) 02/22/2022   Age-related osteoporosis without current pathological fracture 02/22/2022   Tachycardia 02/22/2022   Encounter for screening mammogram for malignant neoplasm of breast 02/22/2022   Medicare annual wellness visit, subsequent 02/22/2022   Annual physical exam 02/22/2022   Gastroesophageal reflux disease with esophagitis without hemorrhage 02/22/2022   Vitamin D deficiency 04/14/2021   Hypercholesteremia 05/06/2015    Past Surgical History:  Procedure Laterality Date   ABDOMINAL HYSTERECTOMY  1980   APPENDECTOMY  1967   COLONOSCOPY     COLONOSCOPY WITH PROPOFOL N/A 11/06/2018   Procedure: COLONOSCOPY WITH  PROPOFOL;  Surgeon: Christena Deem, MD;  Location: [facility] ENDOSCOPY;  Service: Endoscopy;  Laterality: N/A;   ESOPHAGOGASTRODUODENOSCOPY     ESOPHAGOGASTRODUODENOSCOPY (EGD) WITH PROPOFOL N/A 11/06/2018   Procedure: ESOPHAGOGASTRODUODENOSCOPY (EGD) WITH PROPOFOL;  Surgeon: Christena Deem, MD;  Location: [facility] ENDOSCOPY;  Service: Endoscopy;  Laterality: N/A;    OB History     Gravida  2   Para  2   Term      Preterm      AB      Living         SAB      IAB      Ectopic      Multiple      Live Births               Home Medications    Prior to Admission medications   Medication Sig Start Date End Date Taking? Authorizing Provider  Accu-Chek Softclix Lancets lancets USE UP TO FOUR TIMES DAILY AS DIRECTED 09/14/23   Jacky Kindle, FNP  blood glucose meter kit and supplies Dispense based on patient and insurance preference. Use up to four times daily as directed. (FOR ICD-10 E10.9, E11.9). 03/12/22   Jacky Kindle, FNP  calcium-vitamin D (OSCAL WITH D) 500-200 MG-UNIT TABS tablet Take 1 tablet by mouth in the morning and at bedtime.    [provider]  glucose blood (ACCU-CHEK GUIDE) test strip USE UP TO FOUR TIMES DAILY AS INSTRUCTED 09/14/23   Suzie Portela,  Daryl Eastern, FNP  metFORMIN (GLUCOPHAGE-XR) 750 MG 24 hr tablet TAKE 1 TABLET BY MOUTH DAILY  WITH BREAKFAST 03/12/24   Pardue, Monico Blitz, DO  pantoprazole (PROTONIX) 40 MG tablet Take 1 tablet (40 mg total) by mouth 2 (two) times daily. 02/03/24   Sherlyn Hay, DO  rosuvastatin (CRESTOR) 5 MG tablet TAKE 1 TABLET BY MOUTH DAILY 08/10/23   Jacky Kindle, FNP    Family History Family History  Problem Relation Age of Onset   Hypertension Father    CVA Father    Healthy Sister    Healthy Sister    Diabetes Brother    Healthy Brother    Diabetes Son    Colon cancer Maternal Aunt    Breast cancer Neg Hx    Ovarian cancer Neg Hx     Social History Social History   Tobacco Use   Smoking status: Never    Smokeless tobacco: Never  Vaping Use   Vaping status: Never Used  Substance Use Topics   Alcohol use: No   Drug use: No     Allergies   Codeine and Doxycycline   Review of Systems Review of Systems  Constitutional:  Negative for chills and fever.  HENT:  Positive for congestion. Negative for ear pain and sore throat.   Respiratory:  Positive for cough and shortness of breath.   Cardiovascular:  Negative for chest pain and palpitations.     Physical Exam Triage Vital Signs ED Triage Vitals  Encounter Vitals Group     BP 03/23/24 1411 112/60     Systolic BP Percentile --      Diastolic BP Percentile --      Pulse Rate 03/23/24 1411 (!) 112     Resp 03/23/24 1411 18     Temp 03/23/24 1411 98.9 F (37.2 C)     Temp src --      SpO2 03/23/24 1411 90 %     Weight --      Height --      Head Circumference --      Peak Flow --      Pain Score 03/23/24 1410 0     Pain Loc --      Pain Education --      Exclude from Growth Chart --    No data found.  Updated Vital Signs BP 112/60   Pulse (!) 112   Temp 98.9 F (37.2 C)   Resp (!) 25   SpO2 (!) 86%   Visual Acuity Right Eye Distance:   Left Eye Distance:   Bilateral Distance:    Right Eye Near:   Left Eye Near:    Bilateral Near:     Physical Exam Constitutional:      General: She is not in acute distress. HENT:     Mouth/Throat:     Mouth: Mucous membranes are moist.  Cardiovascular:     Rate and Rhythm: Regular rhythm. Tachycardia present.     Heart sounds: Normal heart sounds.  Pulmonary:     Effort: Pulmonary effort is normal. Tachypnea present. No respiratory distress.     Breath sounds: Normal breath sounds. Decreased air movement present.     Comments: Frequent nonproductive cough Neurological:     Mental Status: She is alert.      UC Treatments / Results  Labs (all labs ordered are listed, but only abnormal results are displayed) Labs Reviewed  POC SARS CORONAVIRUS 2 AG -  ED  POC  RSV    EKG   Radiology No results found.  Procedures Procedures (including critical care time)  Medications Ordered in UC Medications  albuterol (PROVENTIL) (2.5 MG/3ML) 0.083% nebulizer solution 2.5 mg (2.5 mg Nebulization Given 03/23/24 1417)    Initial Impression / Assessment and Plan / UC Course  I have reviewed the triage vital signs and the nursing notes.  Pertinent labs & imaging results that were available during my care of the patient were reviewed by me and considered in my medical decision making (see chart for details).   Hypoxia, shortness of breath.  O2 sat 90% upon arrival.  Albuterol nebulizer treatment given here.  After nebulizer treatment, patient remained short of breath and her O2 sat is 86-87% on room air.  O2 4 L via nasal cannula applied and O2 sat improved to 92%.  Sending to the ED via EMS.    Final Clinical Impressions(s) / UC Diagnoses   Final diagnoses:  Hypoxia  Shortness of breath   Discharge Instructions   None    ED Prescriptions   None    PDMP not reviewed this encounter.   Mickie Bail, NP 03/23/24 1438

## 2024-03-23 NOTE — ED Triage Notes (Signed)
 Pt to ED via [facility] from UC. RA at UC 88%. Pt given duoneb PTA. 92-33% on 4L [place]. Pt reports cough, nasal congestion and SOB. PT reports works in [employer].

## 2024-03-23 NOTE — H&P (Signed)
 History and Physical    Holly Matthews BJY:[id number] DOB: 04-Dec-1951 DOA: 03/23/2024  Referring MD/NP/PA:   PCP: Sherlyn Hay, DO   Patient coming from:  The patient is coming from home.     Chief Complaint: cough, SOB, fever  HPI: Holly Matthews is a 73 y.o. female with medical history significant of DM, HLD, dCHF (2D echo on 11/02/2018 showed EF > 55% with grade 1 diastolic dysfunction), vertigo, CKD stage II, GERD, recently diagnosed with kidney stone with mild right hydronephrosis, who presents with cough, SOB, fever  Patient states that she has been sick for almost 1 week, including cough with little mucus production, nasal congestion, fever, chills, SOB, which has been progressively worsening.  No chest pain.  No nausea, vomiting, diarrhea or abdominal pain.  Denies symptoms of UTI or hematuria.  No flank pain.  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   Patient is not using oxygen normally, but was found to have moderate respiratory distress, oxygen desaturation to 88% on room air, which improved to 92-94% on 4 L oxygen ED.  Of note, pt recently underwent a abdominal ultrasound for reported gallbladder polyps with incidental finding of large right renal stones with mild right hydronephrosis.  Pt was seen by Dr. Richardo Hanks of urology on 03/15/24, who ordered CT-renal stone, which is not done yet.   RUQ-US 02/20/24  1. Multiple gallbladder polyps measuring up to 3 mm. No imaging follow-up needed. 2. Incidentally identified multiple large right renal stones with mild right hydronephrosis. 3. Mildly increased hepatic parenchymal echogenicity suggestive of steatosis.   Data reviewed independently and ED Course: pt was found to have WBC 7.6, negative PCR for COVID, flu and RSV, UA (turbid appearance, large amount of leukocyte, many bacteria, WBC> 50, squamous cells 6-10), troponin 3, lactic acid 1.3.  Temperature 100.8, blood pressure 128/62 --> 99/64, heart rate of 130, RR 28.  Patient is  admitted to PCU as inpatient.  CXR: Mild left posterior basilar opacity is noted concerning for possible pneumonia or atelectasis. Followup PA and lateral chest X-ray is recommended in 3-4 weeks following trial of antibiotic therapy to ensure resolution and exclude underlying malignancy.  CT-renal stone: 1. New right staghorn calculus with mild right hydronephrosis.  The 2. Punctate nonobstructing left renal calculi. 3. Moderate size hiatal hernia. 4. Bilateral multifocal lower lobe airspace disease compatible with multiple focal pneumonia. Recommend follow-up PA and lateral chest x-ray to confirm resolution in 4-6 weeks.    EKG: I have personally reviewed.  Sinus rhythm, QTc 463, right bundle blockade, low voltage, heart rate of 120   Review of Systems:   General: has fevers, chills, no body weight gain, has fatigue HEENT: no blurry vision, hearing changes or sore throat Respiratory: has dyspnea, coughing, no wheezing CV: no chest pain, no palpitations GI: no nausea, vomiting, abdominal pain, diarrhea, constipation GU: no dysuria, burning on urination, increased urinary frequency, hematuria  Ext: no leg edema Neuro: no unilateral weakness, numbness, or tingling, no vision change or hearing loss Skin: no rash, no skin tear. MSK: No muscle spasm, no deformity, no limitation of range of movement in spin Heme: No easy bruising.  Travel history: No recent long distant travel.   Allergy:  Allergies  Allergen Reactions   Codeine    Doxycycline Other (See Comments)    Diarrhea/ abdominal cramping    Past Medical History:  Diagnosis Date   Adenomatous polyp 05/06/2015   Had repeat colonoscopy 2014.    Repeat in 5  years.   Allergy    Diabetes mellitus without complication (HCC)    Diet controlled   GERD (gastroesophageal reflux disease)    History of colon polyps 05/06/2015   Hyperlipidemia    Serum cholesterol elevated     Past Surgical History:  Procedure Laterality  Date   ABDOMINAL HYSTERECTOMY  1980   APPENDECTOMY  1967   COLONOSCOPY     COLONOSCOPY WITH PROPOFOL N/A 11/06/2018   Procedure: COLONOSCOPY WITH PROPOFOL;  Surgeon: Christena Deem, MD;  Location[facility] ENDOSCOPY;  Service: Endoscopy;  Laterality: N/A;   ESOPHAGOGASTRODUODENOSCOPY     ESOPHAGOGASTRODUODENOSCOPY (EGD) WITH PROPOFOL N/A 11/06/2018   Procedure: ESOPHAGOGASTRODUODENOSCOPY (EGD) WITH PROPOFOL;  Surgeon: Christena Deem, MD;  Location: [facility] ENDOSCOPY;  Service: Endoscopy;  Laterality: N/A;    Social History:  reports that she has never smoked. She has never used smokeless tobacco. She reports that she does not drink alcohol and does not use drugs.  Family History:  Family History  Problem Relation Age of Onset   Hypertension Father    CVA Father    Healthy Sister    Healthy Sister    Diabetes Brother    Healthy Brother    Diabetes Son    Colon cancer Maternal Aunt    Breast cancer Neg Hx    Ovarian cancer Neg Hx      Prior to Admission medications   Medication Sig Start Date End Date Taking? Authorizing Provider  Accu-Chek Softclix Lancets lancets USE UP TO FOUR TIMES DAILY AS DIRECTED 09/14/23   Jacky Kindle, FNP  blood glucose meter kit and supplies Dispense based on patient and insurance preference. Use up to four times daily as directed. (FOR ICD-10 E10.9, E11.9). 03/12/22   Jacky Kindle, FNP  calcium-vitamin D (OSCAL WITH D) 500-200 MG-UNIT TABS tablet Take 1 tablet by mouth in the morning and at bedtime.    [provider]  glucose blood (ACCU-CHEK GUIDE) test strip USE UP TO FOUR TIMES DAILY AS INSTRUCTED 09/14/23   Merita Norton T, FNP  metFORMIN (GLUCOPHAGE-XR) 750 MG 24 hr tablet TAKE 1 TABLET BY MOUTH DAILY  WITH BREAKFAST 03/12/24   Pardue, Monico Blitz, DO  pantoprazole (PROTONIX) 40 MG tablet Take 1 tablet (40 mg total) by mouth 2 (two) times daily. 02/03/24   Sherlyn Hay, DO  rosuvastatin (CRESTOR) 5 MG tablet TAKE 1 TABLET BY MOUTH DAILY  08/10/23   Jacky Kindle, FNP    Physical Exam: Vitals:   03/23/24 1514  BP: 128/62  Pulse: (!) 130  Resp: (!) 28  Temp: (!) 100.8 F (38.2 C)  TempSrc: Oral  SpO2: 94%   General: Has moderate acute respiratory distress HEENT:       Eyes: PERRL, EOMI, no jaundice       ENT: No discharge from the ears and nose, no pharynx injection, no tonsillar enlargement.        Neck: No JVD, no bruit, no mass felt. Heme: No neck lymph node enlargement. Cardiac: S1/S2, RRR, No murmurs, No gallops or rubs. Respiratory: has coarse breath sound bilaterally. GI: Soft, nondistended, nontender, no rebound pain, no organomegaly, BS present. GU: No hematuria Ext: No pitting leg edema bilaterally. 1+DP/PT pulse bilaterally. Musculoskeletal: No joint deformities, No joint redness or warmth, no limitation of ROM in spin. Skin: No rashes.  Neuro: Alert, oriented X3, cranial nerves II-XII grossly intact, moves all extremities normally. Psych: Patient is not psychotic, no suicidal or hemocidal ideation.  Labs on  Admission: I have personally reviewed following labs and imaging studies  CBC: Recent Labs  Lab 03/23/24 1514  WBC 7.6  HGB 9.9*  HCT 31.0*  MCV 78.7*  PLT 267   Basic Metabolic Panel: Recent Labs  Lab 03/23/24 1514  NA 137  K 3.7  CL 105  CO2 23  GLUCOSE 161*  BUN 15  CREATININE 1.13*  CALCIUM 9.2   GFR: Estimated Creatinine Clearance: 33.6 mL/min (A) (by C-G formula based on SCr of 1.13 mg/dL (H)). Liver Function Tests: Recent Labs  Lab 03/23/24 1724  AST 14*  ALT 11  ALKPHOS 47  BILITOT 0.8  PROT 7.7  ALBUMIN 3.6   No results for input(s): "LIPASE", "AMYLASE" in the last 168 hours. No results for input(s): "AMMONIA" in the last 168 hours. Coagulation Profile: Recent Labs  Lab 03/23/24 1724  INR 1.1   Cardiac Enzymes: No results for input(s): "CKTOTAL", "CKMB", "CKMBINDEX", "TROPONINI" in the last 168 hours. BNP (last 3 results) No results for input(s):  "PROBNP" in the last 8760 hours. HbA1C: No results for input(s): "HGBA1C" in the last 72 hours. CBG: No results for input(s): "GLUCAP" in the last 168 hours. Lipid Profile: No results for input(s): "CHOL", "HDL", "LDLCALC", "TRIG", "CHOLHDL", "LDLDIRECT" in the last 72 hours. Thyroid Function Tests: No results for input(s): "TSH", "T4TOTAL", "FREET4", "T3FREE", "THYROIDAB" in the last 72 hours. Anemia Panel: No results for input(s): "VITAMINB12", "FOLATE", "FERRITIN", "TIBC", "IRON", "RETICCTPCT" in the last 72 hours. Urine analysis:    Component Value Date/Time   COLORURINE YELLOW (A) 03/23/2024 1724   APPEARANCEUR TURBID (A) 03/23/2024 1724   APPEARANCEUR Cloudy (A) 03/15/2024 1410   LABSPEC 1.017 03/23/2024 1724   PHURINE 5.0 03/23/2024 1724   GLUCOSEU NEGATIVE 03/23/2024 1724   HGBUR MODERATE (A) 03/23/2024 1724   BILIRUBINUR NEGATIVE 03/23/2024 1724   BILIRUBINUR Negative 03/15/2024 1410   KETONESUR NEGATIVE 03/23/2024 1724   PROTEINUR 100 (A) 03/23/2024 1724   UROBILINOGEN 0.2 12/12/2019 0933   NITRITE NEGATIVE 03/23/2024 1724   LEUKOCYTESUR LARGE (A) 03/23/2024 1724   Sepsis Labs: @LABRCNTIP (procalcitonin:4,lacticidven:4) ) Recent Results (from the past 240 hours)  Microscopic Examination     Status: Abnormal   Collection Time: 03/15/24  2:10 PM   Urine  Result Value Ref Range Status   WBC, UA >30 (A) 0 - 5 /hpf Final   RBC, Urine 11-30 (A) 0 - 2 /hpf Final   Epithelial Cells (non renal) 0-10 0 - 10 /hpf Final   Bacteria, UA Many (A) None seen/Few Final  CULTURE, URINE COMPREHENSIVE     Status: Abnormal   Collection Time: 03/15/24  4:13 PM   Specimen: Urine   UR  Result Value Ref Range Status   Urine Culture, Comprehensive Final report (A)  Final   Organism ID, Bacteria Klebsiella oxytoca (A)  Final    Comment: Greater than 100,000 colony forming units per mL   ANTIMICROBIAL SUSCEPTIBILITY Comment  Final    Comment:       ** S = Susceptible; I = Intermediate;  R = Resistant **                    P = Positive; N = Negative             MICS are expressed in micrograms per mL    Antibiotic                 RSLT#1    RSLT#2    RSLT#3  RSLT#4 Amoxicillin/Clavulanic Acid    S Ampicillin                     R Cefepime                       S Cefoxitin                      S Cefpodoxime                    S Ceftriaxone                    S Ciprofloxacin                  S Ertapenem                      S Gentamicin                     S Levofloxacin                   S Meropenem                      S Nitrofurantoin                 S Tetracycline                   S Tobramycin                     S Trimethoprim/Sulfa             S   Resp panel by RT-PCR (RSV, Flu A&B, Covid) Anterior Nasal Swab     Status: None   Collection Time: 03/23/24  5:24 PM   Specimen: Anterior Nasal Swab  Result Value Ref Range Status   SARS Coronavirus 2 by RT PCR NEGATIVE NEGATIVE Final    Comment: (NOTE) SARS-CoV-2 target nucleic acids are NOT DETECTED.  The SARS-CoV-2 RNA is generally detectable in upper respiratory specimens during the acute phase of infection. The lowest concentration of SARS-CoV-2 viral copies this assay can detect is 138 copies/mL. A negative result does not preclude SARS-Cov-2 infection and should not be used as the sole basis for treatment or other patient management decisions. A negative result may occur with  improper specimen collection/handling, submission of specimen other than nasopharyngeal swab, presence of viral mutation(s) within the areas targeted by this assay, and inadequate number of viral copies(<138 copies/mL). A negative result must be combined with clinical observations, patient history, and epidemiological information. The expected result is Negative.  Fact Sheet for Patients:  BloggerCourse.com  Fact Sheet for Healthcare Providers:  SeriousBroker.it  This test is  no t yet approved or cleared by the [place] FDA and  has been authorized for detection and/or diagnosis of SARS-CoV-2 by FDA under an Emergency Use Authorization (EUA). This EUA will remain  in effect (meaning this test can be used) for the duration of the COVID-19 declaration under Section 564(b)(1) of the Act, 21 U.S.C.section 360bbb-3(b)(1), unless the authorization is terminated  or revoked sooner.       Influenza A by PCR NEGATIVE NEGATIVE Final   Influenza B by PCR NEGATIVE NEGATIVE Final    Comment: (NOTE) The Xpert Xpress SARS-CoV-2/FLU/RSV plus assay is intended as an aid in the diagnosis of  influenza from Nasopharyngeal swab specimens and should not be used as a sole basis for treatment. Nasal washings and aspirates are unacceptable for Xpert Xpress SARS-CoV-2/FLU/RSV testing.  Fact Sheet for Patients: BloggerCourse.com  Fact Sheet for Healthcare Providers: SeriousBroker.it  This test is not yet approved or cleared by the [place] FDA and has been authorized for detection and/or diagnosis of SARS-CoV-2 by FDA under an Emergency Use Authorization (EUA). This EUA will remain in effect (meaning this test can be used) for the duration of the COVID-19 declaration under Section 564(b)(1) of the Act, 21 U.S.C. section 360bbb-3(b)(1), unless the authorization is terminated or revoked.     Resp Syncytial Virus by PCR NEGATIVE NEGATIVE Final    Comment: (NOTE) Fact Sheet for Patients: BloggerCourse.com  Fact Sheet for Healthcare Providers: SeriousBroker.it  This test is not yet approved or cleared by the [place] FDA and has been authorized for detection and/or diagnosis of SARS-CoV-2 by FDA under an Emergency Use Authorization (EUA). This EUA will remain in effect (meaning this test can be used) for the duration of the COVID-19 declaration under Section  564(b)(1) of the Act, 21 U.S.C. section 360bbb-3(b)(1), unless the authorization is terminated or revoked.  Performed at [facility], [address] [place], [place] [address]      Radiological Exams on Admission:   Assessment/Plan Principal Problem:   CAP (community acquired pneumonia) Active Problems:   Acute respiratory failure with hypoxia (HCC)   Sepsis (HCC)   Kidney stone   Hydronephrosis of right kidney   CKD (chronic kidney disease) stage 2, GFR 60-89 ml/min   Hypercholesteremia   Diabetes mellitus without complication (HCC)   Microcytic anemia   Chronic diastolic CHF (congestive heart failure) (HCC)   Assessment and Plan:  Acute respiratory failure with hypoxia due to CAP (community acquired pneumonia): Patient has 4L of new oxygen requirement, with moderate acute respiratory distress. CXR showed mild left posterior basilar opacity, indicating possible pneumonia.  Patient does not have leukocytosis, viral pneumonia is a potential differential diagnosis.  PCR negative for COVID, flu and RSV.  Will check RVP.  - Will admit to PCU as inpt - IV Rocephin and azithromycin - Incentive spirometry - Solu-Medrol 80 mg daily, empirically - Mucinex for cough  - Bronchodilators - Urine legionella and S. pneumococcal antigen - Follow up blood culture x2, sputum culture - Check respiratory virus panel -IVF as below for sepsis  Kidney stone and hydronephrosis of right kidney: recent US on 02/20/24 showed multiple large right renal stones with mild right hydronephrosis.  Patient denies flank pain.  Denies symptoms of UTI.  Denies hematuria.  Urinalysis showed turbid appearance, large amount of leukocyte, many bacteria, WBC> 50, with squamous cells 6/10, cannot completely rule out possibility of complicated UTI. - Follow-up urine culture - Pain is on Rocephin as above - f/u CT-renal stone now --> showed new right staghorn calculus with mild right hydronephrosis.   -consulted Dr. Alvester Morin of urology  Sepsis [facility]): Patient meets criteria for sepsis with fever 100.8, heart rate 130, RR 28.  Lactic acid 1.3.  Currently blood pressure is soft, but hemodynamically stable.  Likely due to CAP, but cannot completely rule out possibility of complicated UTI. - Antibiotics as above - Trend lactic acid level per sepsis protocol - IVF: total of 2L of LR bolus, followed by 125 mL per hour of NS  CKD (chronic kidney disease) stage 2, GFR 60-89 ml/min: Kidney function slightly worsening than baseline.  Recent baseline creatinine 1.0  on 06/21/2023.  Her creatinine is 1.13, BUN 15, GFR 51 - Avoid using renal toxic medications - IV fluid as above  Hypercholesteremia -Crestor  Diabetes mellitus without complication [facility]): Recent A1c 7.5, poorly controlled.  Patient taking metformin -SSI  Microcytic anemia: Hemoglobin 9.9 (her hemoglobin was 13.2 on 02/22/2022), no active bleeding.  Chart review revealed that patient had colonoscopy on 10/27/2018, which showed diverticulosis and polyps (removed). - Check anemia panel - Pt may need to f/u with GI for repeating colonoscopy as outpatient  Chronic diastolic CHF: 2D echo on 11/02/2018 showed EF > 55% with grade 1 diastolic dysfunction.  Patient does not have leg edema JVD.  CHF is compensated.  BNP 12.4. - Watch volume status closely    DVT ppx: SCD  Code Status: Full code   Family Communication:    Yes, patient's son and grandson daughter at bed side.  Disposition Plan:  Anticipate discharge back to previous environment  Consults called: consulted Dr. Alvester Morin of urology  Admission status and Level of care: Progressive:  as inpt        Dispo: The patient is from: Home              Anticipated d/c is to: Home              Anticipated d/c date is: 2 days              Patient currently is not medically stable to d/c.    Severity of Illness:  The appropriate patient status for this patient is INPATIENT. Inpatient  status is judged to be reasonable and necessary in order to provide the required intensity of service to ensure the patient's safety. The patient's presenting symptoms, physical exam findings, and initial radiographic and laboratory data in the context of their chronic comorbidities is felt to place them at high risk for further clinical deterioration. Furthermore, it is not anticipated that the patient will be medically stable for discharge from the hospital within 2 midnights of admission.   * I certify that at the point of admission it is my clinical judgment that the patient will require inpatient hospital care spanning beyond 2 midnights from the point of admission due to high intensity of service, high risk for further deterioration and high frequency of surveillance required.*       Date of Service 03/23/2024    Lorretta Harp [facility]   If 7PM-7AM, please contact night-coverage [email or web address] 03/23/2024, 7:22 PM

## 2024-03-23 NOTE — ED Notes (Signed)
 Patient is being discharged from the Urgent Care and sent to the Emergency Department via [facility] . Per Wendee Beavers NP, patient is in need of higher level of care due to hypoxia. Patient is aware and verbalizes understanding of plan of care.  Vitals:   03/23/24 1437 03/23/24 1438  BP:    Pulse:    Resp:  (!) 26  Temp:    SpO2: (!) 86% 92%

## 2024-03-23 NOTE — ED Provider Notes (Signed)
 Holly Matthews Provider Note    Event Date/Time   First MD Initiated Contact with Patient 03/23/24 1642     (approximate)   History   Shortness of Breath   HPI  Holly Matthews is a 73 y.o. female with history of GERD, diabetes, hyperlipidemia, no prior history of smoking, COPD, asthma, CHF presenting with shortness of breath and cough.  Cough has been ongoing for about a week, she denies any chest pain, shortness of breath started yesterday.  No nausea, vomiting, diarrhea, abdominal pain, urinary symptoms.  No fever at home.  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  Patient also states that she has a UTI but is not antibiotics at that time,  Independent history obtained from EMS, patient was sent in from urgent care where she was satting 88% on room air.  She was given a DuoNeb prior to arrival, was on 4 L satting 92 to 93%.  On independent chart review, she had a urine culture that was positive from the 27 March, susceptible to ceftriaxone.   Physical Exam   Triage Vital Signs: ED Triage Vitals  Encounter Vitals Group     BP 03/23/24 1514 128/62     Systolic BP Percentile --      Diastolic BP Percentile --      Pulse Rate 03/23/24 1514 (!) 130     Resp 03/23/24 1514 (!) 28     Temp 03/23/24 1514 (!) 100.8 F (38.2 C)     Temp Source 03/23/24 1514 Oral     SpO2 03/23/24 1514 94 %     Weight --      Height --      Head Circumference --      Peak Flow --      Pain Score 03/23/24 1512 0     Pain Loc --      Pain Education --      Exclude from Growth Chart --     Most recent vital signs: Vitals:   03/23/24 1514  BP: 128/62  Pulse: (!) 130  Resp: (!) 28  Temp: (!) 100.8 F (38.2 C)  SpO2: 94%     General: Awake, no distress.  CV:  Good peripheral perfusion.  Resp:  Normal effort.  Clear Abd:  No distention.  Soft nontender Other:  No lower extremity edema   ED Results / Procedures / Treatments   Labs (all labs ordered are listed, but  only abnormal results are displayed) Labs Reviewed  CBC - Abnormal; Notable for the following components:      Result Value   Hemoglobin 9.9 (*)    HCT 31.0 (*)    MCV 78.7 (*)    MCH 25.1 (*)    RDW 16.9 (*)    All other components within normal limits  BASIC METABOLIC PANEL WITH GFR - Abnormal; Notable for the following components:   Glucose, Bld 161 (*)    Creatinine, Ser 1.13 (*)    GFR, Estimated 51 (*)    All other components within normal limits  URINALYSIS, W/ REFLEX TO CULTURE (INFECTION SUSPECTED) - Abnormal; Notable for the following components:   Color, Urine YELLOW (*)    APPearance TURBID (*)    Hgb urine dipstick MODERATE (*)    Protein, ur 100 (*)    Leukocytes,Ua LARGE (*)    Bacteria, UA MANY (*)    All other components within normal limits  HEPATIC FUNCTION PANEL - Abnormal; Notable for  the following components:   AST 14 (*)    All other components within normal limits  RESP PANEL BY RT-PCR (RSV, FLU A&B, COVID)  RVPGX2  CULTURE, BLOOD (ROUTINE X 2)  CULTURE, BLOOD (ROUTINE X 2)  LACTIC ACID, PLASMA  PROTIME-INR  LACTIC ACID, PLASMA  TROPONIN I (HIGH SENSITIVITY)     EKG  EKG shows sinus tachycardia, rate 120, normal QTc, prolonged QRS, right bundle branch block, no ischemic ST elevation, T wave flattening in aVL, not significantly compared to prior   RADIOLOGY Chest x-ray on my independent interpretation shows left basilar opacity more prominent on the lateral view.   PROCEDURES:  Critical Care performed: Yes, see critical care procedure note(s)  .Critical Care  Performed by: Claybon Jabs, MD Authorized by: Claybon Jabs, MD   Critical care provider statement:    Critical care time (minutes):  40   Critical care was necessary to treat or prevent imminent or life-threatening deterioration of the following conditions:  Sepsis and respiratory failure   Critical care was time spent personally by me on the following activities:  Development of  treatment plan with patient or surrogate, discussions with consultants, evaluation of patient's response to treatment, examination of patient, ordering and review of laboratory studies, ordering and review of radiographic studies, ordering and performing treatments and interventions, pulse oximetry, re-evaluation of patient's condition and review of old charts Ultrasound ED Echo  Date/Time: 03/23/2024 5:13 PM  Performed by: Claybon Jabs, MD Authorized by: Claybon Jabs, MD   Procedure details:    Indications: dyspnea     Views: subxiphoid, parasternal long axis view and IVC view   Findings:    Pericardium comment:  Possible trace pericardial effusion   LV Function: normal (>50% EF)     RV Diameter: normal     IVC: normal   Impression:    Impression comment:  Possible trace pericardial effusion otherwise normal contractility, IVC is normal and collapsing, no evidence of RV enlargement Ultrasound ED Thoracic  Date/Time: 03/23/2024 5:14 PM  Performed by: Claybon Jabs, MD Authorized by: Claybon Jabs, MD   Procedure details:    Indications: dyspnea     Assessment for:  Interstitial syndrome   Left lung pleural:  Visualized   Right lung pleural:  Visualized Findings:    A-lines noted throughout: identified     B-lines noted throughout: not identified   Impression:    Impression: none      MEDICATIONS ORDERED IN ED: Medications  lactated ringers bolus 500 mL (has no administration in time range)  azithromycin (ZITHROMAX) 500 mg in sodium chloride 0.9 % 250 mL IVPB (500 mg Intravenous New Bag/Given 03/23/24 1758)  ibuprofen (ADVIL) tablet 600 mg (600 mg Oral Given 03/23/24 1524)  cefTRIAXone (ROCEPHIN) 2 g in sodium chloride 0.9 % 100 mL IVPB (0 g Intravenous Stopped 03/23/24 1756)  lactated ringers bolus 1,000 mL (1,000 mLs Intravenous New Bag/Given 03/23/24 1728)     IMPRESSION / MDM / ASSESSMENT AND PLAN / ED COURSE  I reviewed the triage vital signs and the nursing notes.                               Differential diagnosis includes, but is not limited to, sepsis, pneumonia, COVID, influenza, RSV, viral illness, considered CHF but patient does not appear volume overloaded, considered ACS, considered PE but she has no unilateral calf swelling or tenderness, with the  fever, cough, more likely diagnosis is infectious.  Will get labs, EKG, troponin, chest x-ray, blood cultures, lactic acid.  Will give her some IV fluids.  Will treat her empirically for pneumonia.  Patient's presentation is most consistent with acute presentation with potential threat to life or bodily function.  Independent review of labs and imaging are below.  Bedside ultrasound showed no B-lines, IVC is normal and collapsing.  Given the hypoxia, tachycardia, fever, findings of pneumonia as well as UTI, patient is at high risk and will need to be admitted for further management.  Shared decision making with patient and she is agreeable plan.  Consulted hospitalist who is agreeable with plan for admission will evaluate the patient.  She is admitted.  Clinical Course as of 03/23/24 1825  Fri Mar 23, 2024  1711 DG Chest 2 View IMPRESSION: Mild left posterior basilar opacity is noted concerning for possible pneumonia or atelectasis. Followup PA and lateral chest X-ray is recommended in 3-4 weeks following trial of antibiotic therapy to ensure resolution and exclude underlying malignancy.   [TT]  1750 Independent review of your labs, UA is consistent with UTI, no leukocytosis, electrolytes not severely deranged, she has a very mild AKI [TT]  1818 Hemoglobin(!): 9.9 Hemoglobin is lower compared to prior, she denies any GI bleed, no melena, hematochezia, hematemesis, hematuria, vaginal bleed. [TT]    Clinical Course User Index [TT] Claybon Jabs, MD     FINAL CLINICAL IMPRESSION(S) / ED DIAGNOSES   Final diagnoses:  Pneumonia of left lower lobe due to infectious organism  Fever, unspecified fever cause   Hypoxia  Cough, unspecified type  Shortness of breath     Rx / DC Orders   ED Discharge Orders     None        Note:  This document was prepared using Dragon voice recognition software and may include unintentional dictation errors.    Claybon Jabs, MD 03/23/24 [phone]

## 2024-03-23 NOTE — Sepsis Progress Note (Signed)
 Sepsis protocol monitored by eLink ?

## 2024-03-24 DIAGNOSIS — J189 Pneumonia, unspecified organism: Secondary | ICD-10-CM | POA: Diagnosis not present

## 2024-03-24 LAB — CBC
HCT: 28.2 % — ABNORMAL LOW (ref 36.0–46.0)
Hemoglobin: 8.9 g/dL — ABNORMAL LOW (ref 12.0–15.0)
MCH: 24.9 pg — ABNORMAL LOW (ref 26.0–34.0)
MCHC: 31.6 g/dL (ref 30.0–36.0)
MCV: 78.8 fL — ABNORMAL LOW (ref 80.0–100.0)
Platelets: 247 10*3/uL (ref 150–400)
RBC: 3.58 MIL/uL — ABNORMAL LOW (ref 3.87–5.11)
RDW: 16.9 % — ABNORMAL HIGH (ref 11.5–15.5)
WBC: 7.7 10*3/uL (ref 4.0–10.5)
nRBC: 0 % (ref 0.0–0.2)

## 2024-03-24 LAB — BASIC METABOLIC PANEL WITH GFR
Anion gap: 7 (ref 5–15)
BUN: 12 mg/dL (ref 8–23)
CO2: 20 mmol/L — ABNORMAL LOW (ref 22–32)
Calcium: 8.6 mg/dL — ABNORMAL LOW (ref 8.9–10.3)
Chloride: 109 mmol/L (ref 98–111)
Creatinine, Ser: 0.94 mg/dL (ref 0.44–1.00)
GFR, Estimated: >60 mL/min (ref >60)
Glucose, Bld: 220 mg/dL — ABNORMAL HIGH (ref 70–99)
Potassium: 3.8 mmol/L (ref 3.5–5.1)
Sodium: 136 mmol/L (ref 135–145)

## 2024-03-24 LAB — RESPIRATORY PANEL BY PCR

## 2024-03-24 LAB — EXPECTORATED SPUTUM ASSESSMENT W GRAM STAIN, RFLX TO RESP C

## 2024-03-24 LAB — GLUCOSE, CAPILLARY
Glucose-Capillary: 180 mg/dL — ABNORMAL HIGH (ref 70–99)
Glucose-Capillary: 228 mg/dL — ABNORMAL HIGH (ref 70–99)
Glucose-Capillary: 206 mg/dL — ABNORMAL HIGH (ref 70–99)
Glucose-Capillary: 230 mg/dL — ABNORMAL HIGH (ref 70–99)

## 2024-03-24 LAB — STREP PNEUMONIAE URINARY ANTIGEN: Strep Pneumo Urinary Antigen: POSITIVE — AB

## 2024-03-24 MED ORDER — ENOXAPARIN SODIUM 40 MG/0.4ML IJ SOSY
40.0000 mg | PREFILLED_SYRINGE | INTRAMUSCULAR | Status: DC
  Filled 2024-03-24: qty 0.4

## 2024-03-24 MED ORDER — MENTHOL 3 MG MT LOZG
1.0000 | LOZENGE | OROMUCOSAL | Status: DC | PRN
  Administered 2024-03-24 – 2024-03-25 (×2): 3 mg via ORAL
  Filled 2024-03-24: qty 9

## 2024-03-24 MED ORDER — GUAIFENESIN-DM 100-10 MG/5ML PO SYRP
5.0000 mL | ORAL_SOLUTION | ORAL | Status: DC | PRN
  Administered 2024-03-24 – 2024-03-25 (×2): 5 mL via ORAL
  Filled 2024-03-24 (×3): qty 10

## 2024-03-24 MED ORDER — PREDNISONE 50 MG PO TABS
50.0000 mg | ORAL_TABLET | Freq: Every day | ORAL | Status: DC
  Administered 2024-03-25: 50 mg via ORAL
  Filled 2024-03-24: qty 1

## 2024-03-24 NOTE — Hospital Course (Addendum)
 Hospital course / significant events:   HPI: Holly Matthews is a 73 y.o. female with medical history significant of DM, HLD, dCHF (2D echo on 11/02/2018 showed EF > 55% with grade 1 diastolic dysfunction), vertigo, CKD stage II, GERD, recently diagnosed with kidney stone with mild right hydronephrosis, who presents to ED 04/04 with cough, SOB, fever x1 week. She was sent from urgent care where O2 sat 90% upon arrival.  Albuterol nebulizer treatment given here.  After nebulizer treatment, patient remained short of breath and her O2 sat is 86-87% on room air.  O2 4 L via nasal cannula applied and O2 sat improved to 92%.  Sending to the ED via EMS.   04/04: to ED from UC.  Temp 100.8, blood pressure 128/62 --> 99/64, heart rate of 130, RR 28. WBC 7.6, negative PCR for COVID, flu and RSV, UA (turbid appearance, large amount of leukocyte, many bacteria, WBC> 50, squamous cells 6-10), troponin 3, lactic acid 1.3. CXR Mild left posterior basilar opacity. CT renal  right staghorn calculus with mild right hydronephrosis, punctate nonobstructing left renal calculi, moderate hiatal hernia, Bilateral multifocal lower lobe airspace disease compatible with multiple focal pneumonia. Admitted to hosptialist. Per urology, can follow outpatient.  04/05: (+)strep pneumo Ag - abx deescalated to rocephin only, await BCx which has showed no growth thus far, pt still orthostatic tachcyardia and SOB/cough but wenaing to room air      Consultants:  none  Procedures/Surgeries: none      ASSESSMENT & PLAN:   Acute respiratory failure with hypoxia due to CAP Patient has 4L of new oxygen requirement, with moderate acute respiratory distress.  O2 support wean as able --> RA Incentive spirometry Solu-Medrol 80 mg daily --> po prednisone  Mucinex for cough  Bronchodilators   CAP (community acquired pneumonia): Strep pneumo CXR showed mild left posterior basilar opacity, indicating possible pneumonia.  Patient does not  have leukocytosis, viral pneumonia is a potential differential diagnosis.  PCR negative for COVID, flu and RSV.  Will check RVP. IV Rocephin IV fluids d/c Follow blood cultures  Urine legionella and S. pneumococcal antigen, RVP --> (+)strep pneumo  Severe Sepsis d/t CAP Sepsis ~ Temp 100.33F, Hr 130 (though this improved quickly), RR 28 (also improved) Severe ~ respiratory failure  Treat CAP as above Monitor sepsis parameters - sepsis resolved as of 03/24/24   Kidney stone and hydronephrosis of right kidney:  recent US on 02/20/24 showed multiple large right renal stones with mild right hydronephrosis.  Patient denies flank pain.  Denies symptoms of UTI.  Denies hematuria.  Urinalysis showed turbid appearance, large amount of leukocyte, many bacteria, WBC> 50, with squamous cells 6/10, cannot completely rule out possibility of complicated UTI. Follow-up urine culture Pain is on Rocephin as above f/u CT-renal stone now --> showed new right staghorn calculus with mild right hydronephrosis. --> follow outpatient per Dr Alvester Morin   CKD (chronic kidney disease) stage 2, GFR 60-89 ml/min Kidney function slightly worsening than baseline on admission  Recent baseline creatinine 1.0 on 06/21/2023.  Her creatinine was 1.13, BUN 15, GFR 51 Avoid using renal toxic medications Monitor BMP   Hypercholesteremia Crestor   Diabetes mellitus without complication  Recent A1c 7.5, poorly controlled.  Patient taking metformin SSI   Microcytic anemia Hemoglobin 9.9 (was 13.2 on 02/22/2022), no active bleeding.  Chart review revealed that patient had colonoscopy on 10/27/2018, which showed diverticulosis and polyps (removed). Check anemia panel Pt may need to f/u with GI for  repeating colonoscopy as outpatient   Chronic diastolic CHF 2D echo on 11/02/2018 showed EF > 55% with grade 1 diastolic dysfunction.  Patient does not have leg edema JVD.  CHF is compensated.  BNP 12.4. Watch volume status closely      No  concerns based on BMI: Body mass index is 24.11 kg/m.  Underweight - under 18  overweight - 25 to 29 obese - 30 or more Class 1 obesity: BMI of 30.0 to 34 Class 2 obesity: BMI of 35.0 to 39 Class 3 obesity: BMI of 40.0 to 49 Super Morbid Obesity: BMI 50-59 Super-super Morbid Obesity: BMI 60+ Significantly low or high BMI is associated with higher medical risk.  Weight management advised as adjunct to other disease management and risk reduction treatments    DVT prophylaxis: lovenox  IV fluids: no continuous IV fluids  Nutrition: cardiac/carb diet  Central lines / other devices: none  Code Status: FULL CODE ACP documentation reviewed:  none on file in [facility]  TOC needs: none at this time Medical barriers to dispo: await blood cultures. Expected medical readiness for discharge tomorrow if BCx remain negative, if tachycardia/SOB improved

## 2024-03-24 NOTE — Progress Notes (Signed)
 PROGRESS NOTE    Holly Matthews   ZOX:[id number] DOB: [birth year]  DOA: 03/23/2024 Date of Service: 03/24/24 which is hospital day 1  PCP: Sherlyn Hay, [facility] course / significant events:   HPI: WILLO Matthews is a 73 y.o. female with medical history significant of DM, HLD, dCHF (2D echo on 11/02/2018 showed EF > 55% with grade 1 diastolic dysfunction), vertigo, CKD stage II, GERD, recently diagnosed with kidney stone with mild right hydronephrosis, who presents to ED 04/04 with cough, SOB, fever x1 week. She was sent from urgent care where O2 sat 90% upon arrival.  Albuterol nebulizer treatment given here.  After nebulizer treatment, patient remained short of breath and her O2 sat is 86-87% on room air.  O2 4 L via nasal cannula applied and O2 sat improved to 92%.  Sending to the ED via EMS.   04/04: to ED from UC.  Temp 100.8, blood pressure 128/62 --> 99/64, heart rate of 130, RR 28. WBC 7.6, negative PCR for COVID, flu and RSV, UA (turbid appearance, large amount of leukocyte, many bacteria, WBC> 50, squamous cells 6-10), troponin 3, lactic acid 1.3. CXR Mild left posterior basilar opacity. CT renal  right staghorn calculus with mild right hydronephrosis, punctate nonobstructing left renal calculi, moderate hiatal hernia, Bilateral multifocal lower lobe airspace disease compatible with multiple focal pneumonia. Admitted to hosptialist. Per urology, can follow outpatient.  04/05: (+)strep pneumo Ag - abx deescalated to rocephin only, await BCx which has showed no growth thus far, pt still orthostatic tachcyardia and SOB/cough but wenaing to room air      Consultants:  none  Procedures/Surgeries: none      ASSESSMENT & PLAN:   Acute respiratory failure with hypoxia due to CAP Patient has 4L of new oxygen requirement, with moderate acute respiratory distress.  O2 support wean as able --> RA Incentive spirometry Solu-Medrol 80 mg daily --> po prednisone  Mucinex for  cough  Bronchodilators   CAP (community acquired pneumonia): Strep pneumo CXR showed mild left posterior basilar opacity, indicating possible pneumonia.  Patient does not have leukocytosis, viral pneumonia is a potential differential diagnosis.  PCR negative for COVID, flu and RSV.  Will check RVP. IV Rocephin IV fluids d/c Follow blood cultures  Urine legionella and S. pneumococcal antigen, RVP --> (+)strep pneumo  Severe Sepsis d/t CAP Sepsis ~ Temp 100.51F, Hr 130 (though this improved quickly), RR 28 (also improved) Severe ~ respiratory failure  Treat CAP as above Monitor sepsis parameters - sepsis resolved as of 03/24/24   Kidney stone and hydronephrosis of right kidney:  recent US on 02/20/24 showed multiple large right renal stones with mild right hydronephrosis.  Patient denies flank pain.  Denies symptoms of UTI.  Denies hematuria.  Urinalysis showed turbid appearance, large amount of leukocyte, many bacteria, WBC> 50, with squamous cells 6/10, cannot completely rule out possibility of complicated UTI. Follow-up urine culture Pain is on Rocephin as above f/u CT-renal stone now --> showed new right staghorn calculus with mild right hydronephrosis. --> follow outpatient per Dr Alvester Morin   CKD (chronic kidney disease) stage 2, GFR 60-89 ml/min Kidney function slightly worsening than baseline on admission  Recent baseline creatinine 1.0 on 06/21/2023.  Her creatinine was 1.13, BUN 15, GFR 51 Avoid using renal toxic medications Monitor BMP   Hypercholesteremia Crestor   Diabetes mellitus without complication  Recent A1c 7.5, poorly controlled.  Patient taking metformin SSI   Microcytic anemia Hemoglobin 9.9 (  was 13.2 on 02/22/2022), no active bleeding.  Chart review revealed that patient had colonoscopy on 10/27/2018, which showed diverticulosis and polyps (removed). Check anemia panel Pt may need to f/u with GI for repeating colonoscopy as outpatient   Chronic diastolic CHF 2D echo on  11/02/2018 showed EF > 55% with grade 1 diastolic dysfunction.  Patient does not have leg edema JVD.  CHF is compensated.  BNP 12.4. Watch volume status closely      No concerns based on BMI: Body mass index is 24.11 kg/m.  Underweight - under 18  overweight - 25 to 29 obese - 30 or more Class 1 obesity: BMI of 30.0 to 34 Class 2 obesity: BMI of 35.0 to 39 Class 3 obesity: BMI of 40.0 to 49 Super Morbid Obesity: BMI 50-59 Super-super Morbid Obesity: BMI 60+ Significantly low or high BMI is associated with higher medical risk.  Weight management advised as adjunct to other disease management and risk reduction treatments    DVT prophylaxis: lovenox  IV fluids: no continuous IV fluids  Nutrition: cardiac/carb diet  Central lines / other devices: none  Code Status: FULL CODE ACP documentation reviewed:  none on file in [facility]  TOC needs: none at this time Medical barriers to dispo: await blood cultures. Expected medical readiness for discharge tomorrow if BCx remain negative, if tachycardia/SOB improved              Subjective / Brief ROS:  Patient reports coughing and SOB on ambulation Denies CP/SOB.  Pain controlled.  Denies new weakness.  Tolerating diet.  Reports no concerns w/ urination/defecation.   Family Communication: family at bedside on rounds     Objective Findings:  Vitals:   03/24/24 0822 03/24/24 1010 03/24/24 1015 03/24/24 1135  BP: 117/62   128/62  Pulse: 81   94  Resp: 16 (!) 29 19 19   Temp: 98.4 F (36.9 C)   98.4 F (36.9 C)  TempSrc: Oral   Oral  SpO2: 93%   93%  Weight:      Height:        Intake/Output Summary (Last 24 hours) at 03/24/2024 1431 Last data filed at 03/24/2024 1049 Gross per 24 hour  Intake 4006.59 ml  Output --  Net 4006.59 ml   Filed Weights   03/24/24 0553  Weight: 56 kg    Examination:  Physical Exam Constitutional:      General: She is not in acute distress.    Appearance: She is not  ill-appearing.  Cardiovascular:     Rate and Rhythm: Normal rate and regular rhythm.  Pulmonary:     Effort: Pulmonary effort is normal.     Breath sounds: Examination of the right-lower field reveals decreased breath sounds. Examination of the left-lower field reveals decreased breath sounds. Decreased breath sounds present. No wheezing or rhonchi.  Musculoskeletal:     Right lower leg: No edema.     Left lower leg: No edema.  Skin:    General: Skin is warm and dry.  Neurological:     General: No focal deficit present.     Mental Status: She is alert and oriented to person, place, and time.  Psychiatric:        Mood and Affect: Mood normal.        Behavior: Behavior normal.          Scheduled Medications:   calcium-vitamin D  1 tablet Oral BID   enoxaparin (LOVENOX) injection  40 mg Subcutaneous Q24H  insulin aspart  0-5 Units Subcutaneous QHS   insulin aspart  0-9 Units Subcutaneous TID WC   pantoprazole  40 mg Oral BID   [START ON 03/25/2024] predniSONE  50 mg Oral Q breakfast   rosuvastatin  5 mg Oral Daily    Continuous Infusions:  cefTRIAXone (ROCEPHIN)  IV      PRN Medications:  acetaminophen, albuterol, dextromethorphan-guaiFENesin, menthol-cetylpyridinium, ondansetron (ZOFRAN) IV  Antimicrobials from admission:  Anti-infectives (From admission, onward)    Start     Dose/Rate Route Frequency Ordered Stop   03/24/24 1600  azithromycin (ZITHROMAX) 500 mg in sodium chloride 0.9 % 250 mL IVPB  Status:  Discontinued        500 mg 250 mL/hr over 60 Minutes Intravenous Every 24 hours 03/23/24 1928 03/24/24 1429   03/24/24 1600  cefTRIAXone (ROCEPHIN) 2 g in sodium chloride 0.9 % 100 mL IVPB        2 g 200 mL/hr over 30 Minutes Intravenous Every 24 hours 03/23/24 1928     03/23/24 1645  cefTRIAXone (ROCEPHIN) 2 g in sodium chloride 0.9 % 100 mL IVPB        2 g 200 mL/hr over 30 Minutes Intravenous Once 03/23/24 1633 03/23/24 1756   03/23/24 1645  azithromycin  (ZITHROMAX) 500 mg in sodium chloride 0.9 % 250 mL IVPB        500 mg 250 mL/hr over 60 Minutes Intravenous  Once 03/23/24 1633 03/23/24 1906           Data Reviewed:  I have personally reviewed the following...  CBC: Recent Labs  Lab 03/23/24 1514 03/24/24 0444  WBC 7.6 7.7  HGB 9.9* 8.9*  HCT 31.0* 28.2*  MCV 78.7* 78.8*  PLT 267 247   Basic Metabolic Panel: Recent Labs  Lab 03/23/24 1514 03/24/24 0444  NA 137 136  K 3.7 3.8  CL 105 109  CO2 23 20*  GLUCOSE 161* 220*  BUN 15 12  CREATININE 1.13* 0.94  CALCIUM 9.2 8.6*   GFR: Estimated Creatinine Clearance: 41.8 mL/min (by C-G formula based on SCr of 0.94 mg/dL). Liver Function Tests: Recent Labs  Lab 03/23/24 1724  AST 14*  ALT 11  ALKPHOS 47  BILITOT 0.8  PROT 7.7  ALBUMIN 3.6   No results for input(s): "LIPASE", "AMYLASE" in the last 168 hours. No results for input(s): "AMMONIA" in the last 168 hours. Coagulation Profile: Recent Labs  Lab 03/23/24 1724  INR 1.1   Cardiac Enzymes: No results for input(s): "CKTOTAL", "CKMB", "CKMBINDEX", "TROPONINI" in the last 168 hours. BNP (last 3 results) No results for input(s): "PROBNP" in the last 8760 hours. HbA1C: No results for input(s): "HGBA1C" in the last 72 hours. CBG: Recent Labs  Lab 03/23/24 2118 03/24/24 0828 03/24/24 1132  GLUCAP 146* 180* 228*   Lipid Profile: No results for input(s): "CHOL", "HDL", "LDLCALC", "TRIG", "CHOLHDL", "LDLDIRECT" in the last 72 hours. Thyroid Function Tests: No results for input(s): "TSH", "T4TOTAL", "FREET4", "T3FREE", "THYROIDAB" in the last 72 hours. Anemia Panel: No results for input(s): "VITAMINB12", "FOLATE", "FERRITIN", "TIBC", "IRON", "RETICCTPCT" in the last 72 hours. Most Recent Urinalysis On File:     Component Value Date/Time   COLORURINE YELLOW (A) 03/23/2024 1724   APPEARANCEUR TURBID (A) 03/23/2024 1724   APPEARANCEUR Cloudy (A) 03/15/2024 1410   LABSPEC 1.017 03/23/2024 1724    PHURINE 5.0 03/23/2024 1724   GLUCOSEU NEGATIVE 03/23/2024 1724   HGBUR MODERATE (A) 03/23/2024 1724   BILIRUBINUR NEGATIVE 03/23/2024 1724  BILIRUBINUR Negative 03/15/2024 1410   KETONESUR NEGATIVE 03/23/2024 1724   PROTEINUR 100 (A) 03/23/2024 1724   UROBILINOGEN 0.2 12/12/2019 0933   NITRITE NEGATIVE 03/23/2024 1724   LEUKOCYTESUR LARGE (A) 03/23/2024 1724   Sepsis Labs: @LABRCNTIP (procalcitonin:4,lacticidven:4) Microbiology: Recent Results (from the past 240 hours)  Microscopic Examination     Status: Abnormal   Collection Time: 03/15/24  2:10 PM   Urine  Result Value Ref Range Status   WBC, UA >30 (A) 0 - 5 /hpf Final   RBC, Urine 11-30 (A) 0 - 2 /hpf Final   Epithelial Cells (non renal) 0-10 0 - 10 /hpf Final   Bacteria, UA Many (A) None seen/Few Final  CULTURE, URINE COMPREHENSIVE     Status: Abnormal   Collection Time: 03/15/24  4:13 PM   Specimen: Urine   UR  Result Value Ref Range Status   Urine Culture, Comprehensive Final report (A)  Final   Organism ID, Bacteria Klebsiella oxytoca (A)  Final    Comment: Greater than 100,000 colony forming units per mL   ANTIMICROBIAL SUSCEPTIBILITY Comment  Final    Comment:       ** S = Susceptible; I = Intermediate; R = Resistant **                    P = Positive; N = Negative             MICS are expressed in micrograms per mL    Antibiotic                 RSLT#1    RSLT#2    RSLT#3    RSLT#4 Amoxicillin/Clavulanic Acid    S Ampicillin                     R Cefepime                       S Cefoxitin                      S Cefpodoxime                    S Ceftriaxone                    S Ciprofloxacin                  S Ertapenem                      S Gentamicin                     S Levofloxacin                   S Meropenem                      S Nitrofurantoin                 S Tetracycline                   S Tobramycin                     S Trimethoprim/Sulfa             S   Resp panel by RT-PCR (RSV, Flu  A&B, Covid) Anterior Nasal Swab  Status: None   Collection Time: 03/23/24  5:24 PM   Specimen: Anterior Nasal Swab  Result Value Ref Range Status   SARS Coronavirus 2 by RT PCR NEGATIVE NEGATIVE Final    Comment: (NOTE) SARS-CoV-2 target nucleic acids are NOT DETECTED.  The SARS-CoV-2 RNA is generally detectable in upper respiratory specimens during the acute phase of infection. The lowest concentration of SARS-CoV-2 viral copies this assay can detect is 138 copies/mL. A negative result does not preclude SARS-Cov-2 infection and should not be used as the sole basis for treatment or other patient management decisions. A negative result may occur with  improper specimen collection/handling, submission of specimen other than nasopharyngeal swab, presence of viral mutation(s) within the areas targeted by this assay, and inadequate number of viral copies(<138 copies/mL). A negative result must be combined with clinical observations, patient history, and epidemiological information. The expected result is Negative.  Fact Sheet for Patients:  BloggerCourse.com  Fact Sheet for Healthcare Providers:  SeriousBroker.it  This test is no t yet approved or cleared by the [place] FDA and  has been authorized for detection and/or diagnosis of SARS-CoV-2 by FDA under an Emergency Use Authorization (EUA). This EUA will remain  in effect (meaning this test can be used) for the duration of the COVID-19 declaration under Section 564(b)(1) of the Act, 21 U.S.C.section 360bbb-3(b)(1), unless the authorization is terminated  or revoked sooner.       Influenza A by PCR NEGATIVE NEGATIVE Final   Influenza B by PCR NEGATIVE NEGATIVE Final    Comment: (NOTE) The Xpert Xpress SARS-CoV-2/FLU/RSV plus assay is intended as an aid in the diagnosis of influenza from Nasopharyngeal swab specimens and should not be used as a sole basis for treatment.  Nasal washings and aspirates are unacceptable for Xpert Xpress SARS-CoV-2/FLU/RSV testing.  Fact Sheet for Patients: BloggerCourse.com  Fact Sheet for Healthcare Providers: SeriousBroker.it  This test is not yet approved or cleared by the [place] FDA and has been authorized for detection and/or diagnosis of SARS-CoV-2 by FDA under an Emergency Use Authorization (EUA). This EUA will remain in effect (meaning this test can be used) for the duration of the COVID-19 declaration under Section 564(b)(1) of the Act, 21 U.S.C. section 360bbb-3(b)(1), unless the authorization is terminated or revoked.     Resp Syncytial Virus by PCR NEGATIVE NEGATIVE Final    Comment: (NOTE) Fact Sheet for Patients: BloggerCourse.com  Fact Sheet for Healthcare Providers: SeriousBroker.it  This test is not yet approved or cleared by the [place] FDA and has been authorized for detection and/or diagnosis of SARS-CoV-2 by FDA under an Emergency Use Authorization (EUA). This EUA will remain in effect (meaning this test can be used) for the duration of the COVID-19 declaration under Section 564(b)(1) of the Act, 21 U.S.C. section 360bbb-3(b)(1), unless the authorization is terminated or revoked.  Performed at [facility], [address] [place], [place] [address]   Blood Culture (routine x 2)     Status: None (Preliminary result)   Collection Time: 03/23/24  5:24 PM   Specimen: BLOOD LEFT ARM  Result Value Ref Range Status   Specimen Description BLOOD LEFT ARM  Final   Special Requests   Final    BOTTLES DRAWN AEROBIC AND ANAEROBIC Blood Culture results may not be optimal due to an inadequate volume of blood received in culture bottles   Culture   Final    NO GROWTH < 12 HOURS Performed at [facility], [address] [place], [place] [address]    Report Status PENDING   Incomplete  Blood Culture (routine x 2)     Status: None (Preliminary result)   Collection Time: 03/23/24  5:24 PM   Specimen: BLOOD RIGHT ARM  Result Value Ref Range Status   Specimen Description BLOOD RIGHT ARM  Final   Special Requests   Final    BOTTLES DRAWN AEROBIC AND ANAEROBIC Blood Culture results may not be optimal due to an inadequate volume of blood received in culture bottles   Culture   Final    NO GROWTH < 12 HOURS Performed at [facility], [address] [place], [place] [address]    Report Status PENDING  Incomplete  Expectorated Sputum Assessment w Gram Stain, Rflx to Resp Cult     Status: None   Collection Time: 03/24/24  9:21 AM   Specimen: Sputum  Result Value Ref Range Status   Specimen Description SPUTUM  Final   Special Requests NONE  Final   Sputum evaluation   Final    THIS SPECIMEN IS ACCEPTABLE FOR SPUTUM CULTURE Performed at [facility], [address] [place], [place] [address]    Report Status 03/24/2024 FINAL  Final  Respiratory (~20 pathogens) panel by PCR     Status: None   Collection Time: 03/24/24 11:04 AM   Specimen: Nasopharyngeal Swab; Respiratory  Result Value Ref Range Status   Adenovirus NOT DETECTED NOT DETECTED Final   Coronavirus 229E NOT DETECTED NOT DETECTED Final    Comment: (NOTE) The Coronavirus on the Respiratory Panel, DOES NOT test for the novel  Coronavirus (2019 nCoV)    Coronavirus HKU1 NOT DETECTED NOT DETECTED Final   Coronavirus NL63 NOT DETECTED NOT DETECTED Final   Coronavirus OC43 NOT DETECTED NOT DETECTED Final   Metapneumovirus NOT DETECTED NOT DETECTED Final   Rhinovirus / Enterovirus NOT DETECTED NOT DETECTED Final   Influenza A NOT DETECTED NOT DETECTED Final   Influenza B NOT DETECTED NOT DETECTED Final   Parainfluenza Virus 1 NOT DETECTED NOT DETECTED Final   Parainfluenza Virus 2 NOT DETECTED NOT DETECTED Final   Parainfluenza Virus 3 NOT DETECTED NOT DETECTED Final    Parainfluenza Virus 4 NOT DETECTED NOT DETECTED Final   Respiratory Syncytial Virus NOT DETECTED NOT DETECTED Final   Bordetella pertussis NOT DETECTED NOT DETECTED Final   Bordetella Parapertussis NOT DETECTED NOT DETECTED Final   Chlamydophila pneumoniae NOT DETECTED NOT DETECTED Final   Mycoplasma pneumoniae NOT DETECTED NOT DETECTED Final    Comment: Performed at [facility], [address] [place], [place] [address]      Radiology Studies last 3 days: CT RENAL STONE STUDY Result Date: 03/23/2024 CLINICAL DATA:  Symptomatic urolithiasis. EXAM: CT ABDOMEN AND PELVIS WITHOUT CONTRAST TECHNIQUE: Multidetector CT imaging of the abdomen and pelvis was performed following the standard protocol without IV contrast. RADIATION DOSE REDUCTION: This exam was performed according to the departmental dose-optimization program which includes automated exposure control, adjustment of the mA and/or kV according to patient size and/or use of iterative reconstruction technique. COMPARISON:  CT abdomen and pelvis 01/21/2017 FINDINGS: Lower chest: Patchy multifocal airspace disease is present in the bilateral lower lobes. Hepatobiliary: No focal liver abnormality is seen. No gallstones, gallbladder wall thickening, or biliary dilatation. Pancreas: Unremarkable. No pancreatic ductal dilatation or surrounding inflammatory changes. Spleen: Normal in size without focal abnormality. Adrenals/Urinary Tract: Bilateral adrenal glands are within normal limits. There is a new staghorn calculus in the  right kidney involving the right mid and lower pole calices, pelvis and proximal ureter. There is mild hydronephrosis of the superior pole the right kidney. There are punctate nonobstructing left renal calculi. There is no left-sided hydronephrosis. There is no perinephric stranding or fluid collection. The adrenal glands are within normal limits. The bladder is within normal limits. Stomach/Bowel: There is a moderate-sized  hiatal hernia. Stomach is otherwise within normal limits. The appendix is not seen. No evidence of bowel wall thickening, distention, or inflammatory changes. Vascular/Lymphatic: Aortic atherosclerosis. No enlarged abdominal or pelvic lymph nodes. Reproductive: Status post hysterectomy. No adnexal masses. Other: No abdominal wall hernia or abnormality. No abdominopelvic ascites. Musculoskeletal: No fracture is seen. IMPRESSION: 1. New right staghorn calculus with mild right hydronephrosis.  The 2. Punctate nonobstructing left renal calculi. 3. Moderate size hiatal hernia. 4. Bilateral multifocal lower lobe airspace disease compatible with multiple focal pneumonia. Recommend follow-up PA and lateral chest x-ray to confirm resolution in 4-6 weeks. Electronically Signed   By: Darliss Cheney M.D.   On: 03/23/2024 20:08   DG Chest 2 View Result Date: 03/23/2024 CLINICAL DATA:  Shortness of breath. EXAM: CHEST - 2 VIEW COMPARISON:  July 05, 2018. FINDINGS: The heart size and mediastinal contours are within normal limits. Right lung is clear. Mild left posterior basilar opacity is noted concerning for possible pneumonia or atelectasis. The visualized skeletal structures are unremarkable. IMPRESSION: Mild left posterior basilar opacity is noted concerning for possible pneumonia or atelectasis. Followup PA and lateral chest X-ray is recommended in 3-4 weeks following trial of antibiotic therapy to ensure resolution and exclude underlying malignancy. Electronically Signed   By: Lupita Raider M.D.   On: 03/23/2024 15:54       Sunnie Nielsen, DO [facility] 03/24/2024, 2:31 PM    Dictation software may have been used to generate the above note. Typos may occur and escape review in typed/dictated notes. Please contact Dr Lyn Hollingshead directly for clarity if needed.  Staff may message me via secure chat in Epic  but this may not receive an immediate response,  please page me for urgent matters!  If  7PM-7AM, please contact night coverage [email or web address]

## 2024-03-25 DIAGNOSIS — J189 Pneumonia, unspecified organism: Secondary | ICD-10-CM | POA: Diagnosis not present

## 2024-03-25 LAB — GLUCOSE, CAPILLARY
Glucose-Capillary: 130 mg/dL — ABNORMAL HIGH (ref 70–99)
Glucose-Capillary: 216 mg/dL — ABNORMAL HIGH (ref 70–99)

## 2024-03-25 MED ORDER — AMOXICILLIN-POT CLAVULANATE 875-125 MG PO TABS
1.0000 | ORAL_TABLET | Freq: Two times a day (BID) | ORAL | 0 refills | Status: AC
Start: 2024-03-25 — End: 2024-04-01

## 2024-03-25 MED ORDER — CEPACOL EXTRA STRENGTH 15-2.6 MG MT LOZG: 1.0000 | LOZENGE | Freq: Two times a day (BID) | OROMUCOSAL | 0 refills | Status: DC | PRN

## 2024-03-25 MED ORDER — MENTHOL 3 MG MT LOZG: 1.0000 | LOZENGE | OROMUCOSAL | 3 refills | Status: DC | PRN

## 2024-03-25 MED ORDER — ALBUTEROL SULFATE HFA 108 (90 BASE) MCG/ACT IN AERS: 1.0000 | INHALATION_SPRAY | RESPIRATORY_TRACT | 0 refills | Status: DC | PRN

## 2024-03-25 MED ORDER — PREDNISONE 50 MG PO TABS: 50.0000 mg | ORAL_TABLET | Freq: Every day | ORAL | 0 refills | Status: AC

## 2024-03-25 MED ORDER — ACETAMINOPHEN 325 MG PO TABS: 650.0000 mg | ORAL_TABLET | Freq: Four times a day (QID) | ORAL | Status: DC | PRN

## 2024-03-25 MED ORDER — HYDROCODONE BIT-HOMATROP MBR 5-1.5 MG/5ML PO SOLN: 5.0000 mL | Freq: Three times a day (TID) | ORAL | 0 refills | Status: DC | PRN

## 2024-03-25 NOTE — Plan of Care (Signed)
  Problem: Safety: Goal: Ability to remain free from injury will improve Outcome: Progressing   Problem: Clinical Measurements: Goal: Respiratory complications will improve Outcome: Progressing Goal: Cardiovascular complication will be avoided Outcome: Progressing   Problem: Pain Managment: Goal: General experience of comfort will improve and/or be controlled Outcome: Progressing   Problem: Respiratory: Goal: Ability to maintain adequate ventilation will improve Outcome: Progressing Goal: Ability to maintain a clear airway will improve Outcome: Progressing

## 2024-03-25 NOTE — Discharge Summary (Signed)
 Physician Discharge Summary   Patient: Holly Matthews MRN: [id number]  DOB: [birth year]   Admit:     Date of Admission: 03/23/2024 Admitted from: home   Discharge: Date of discharge: 03/25/24 Disposition: Home Condition at discharge: good  CODE STATUS: FULL CODE     Discharge Physician: Sunnie Nielsen, DO [facility]     PCP: Sherlyn Hay, DO  Recommendations for Outpatient Follow-up:  Follow up with PCP Sherlyn Hay, DO in 1-2 weeks Please obtain labs/tests: CBC, BMP and consider repeat CXR in 1 week / as needed  Follow asap with urology    Discharge Instructions     Diet general   Complete by: As directed    Increase activity slowly   Complete by: As directed          Discharge Diagnoses: Principal Problem:   CAP (community acquired pneumonia) Active Problems:   Acute respiratory failure with hypoxia (HCC)   Sepsis (HCC)   Kidney stone   Hydronephrosis of right kidney   CKD (chronic kidney disease) stage 2, GFR 60-89 ml/min   Hypercholesteremia   Diabetes mellitus without complication (HCC)   Microcytic anemia   Chronic diastolic CHF (congestive heart failure) [facility])       Hospital Course: Hospital course / significant events:   HPI: Holly Matthews is a 73 y.o. female with medical history significant of DM, HLD, dCHF (2D echo on 11/02/2018 showed EF > 55% with grade 1 diastolic dysfunction), vertigo, CKD stage II, GERD, recently diagnosed with kidney stone with mild right hydronephrosis, who presents to ED 04/04 with cough, SOB, fever x1 week. She was sent from urgent care where O2 sat 90% upon arrival.  Albuterol nebulizer treatment given here.  After nebulizer treatment, patient remained short of breath and her O2 sat is 86-87% on room air.  O2 4 L via nasal cannula applied and O2 sat improved to 92%.  Sending to the ED via EMS.   04/04: to ED from UC.  Temp 100.8, blood pressure 128/62 --> 99/64, heart rate of 130, RR 28. WBC 7.6,  negative PCR for COVID, flu and RSV, UA (turbid appearance, large amount of leukocyte, many bacteria, WBC> 50, squamous cells 6-10), troponin 3, lactic acid 1.3. CXR Mild left posterior basilar opacity. CT renal  right staghorn calculus with mild right hydronephrosis, punctate nonobstructing left renal calculi, moderate hiatal hernia, Bilateral multifocal lower lobe airspace disease compatible with multiple focal pneumonia. Admitted to hosptialist. Per urology, can follow outpatient.  04/05: (+)strep pneumo Ag - abx deescalated to rocephin only, await BCx which has showed no growth thus far, pt still orthostatic tachcyardia and SOB/cough but wenaing to room air  04/06: doing well on room air, BCx neg, ok for dc hom on po abx      Consultants:  none  Procedures/Surgeries: none      ASSESSMENT & PLAN:   Acute respiratory failure with hypoxia due to CAP Patient has 4L of new oxygen requirement, with moderate acute respiratory distress.  O2 support wean as able --> RA Incentive spirometry Solu-Medrol 80 mg daily --> po prednisone  Mucinex for cough  Albuterol prn CAP tx as below    CAP (community acquired pneumonia): Strep pneumo CXR showed mild left posterior basilar opacity, indicating possible pneumonia.  Patient does not have leukocytosis, viral pneumonia is a potential differential diagnosis.  PCR negative for COVID, flu and RSV.  Will check RVP. IV Rocephin --> po augmentin on discharge IV  fluids d/c --> encourage po fluids   Severe Sepsis d/t CAP Sepsis ~ Temp 100.42F, Hr 130 (though this improved quickly), RR 28 (also improved) Severe ~ respiratory failure  Treat CAP as above Monitor sepsis parameters - sepsis resolved as of 03/24/24   Kidney stone and hydronephrosis of right kidney:  recent US on 02/20/24 showed multiple large right renal stones with mild right hydronephrosis.  Patient denies flank pain.  Denies symptoms of UTI.  Denies hematuria.  Urinalysis showed turbid  appearance, large amount of leukocyte, many bacteria, WBC> 50, with squamous cells 6/10, cannot completely rule out possibility of complicated UTI. Follow-up urine culture Pain is on Rocephin as above f/u CT-renal stone now --> showed new right staghorn calculus with mild right hydronephrosis. --> follow outpatient  CKD (chronic kidney disease) stage 2, GFR 60-89 ml/min Kidney function slightly worsening than baseline on admission  Recent baseline creatinine 1.0 on 06/21/2023.  Her creatinine was 1.13, BUN 15, GFR 51 Avoid using renal toxic medications Monitor BMP   Hypercholesteremia Crestor   Diabetes mellitus without complication  Recent A1c 7.5, poorly controlled.  Patient taking metformin Resume home meds    Microcytic anemia Hemoglobin 9.9 (was 13.2 on 02/22/2022), no active bleeding.  Chart review revealed that patient had colonoscopy on 10/27/2018, which showed diverticulosis and polyps (removed). Pt may need to f/u with GI for repeating colonoscopy as outpatient   Chronic diastolic CHF 2D echo on 11/02/2018 showed EF > 55% with grade 1 diastolic dysfunction.  Patient does not have leg edema JVD.  CHF is compensated.  BNP 12.4. Watch volume status closely      No concerns based on BMI: Body mass index is 24.11 kg/m.  Underweight - under 18  overweight - 25 to 29 obese - 30 or more Class 1 obesity: BMI of 30.0 to 34 Class 2 obesity: BMI of 35.0 to 39 Class 3 obesity: BMI of 40.0 to 49 Super Morbid Obesity: BMI 50-59 Super-super Morbid Obesity: BMI 60+ Significantly low or high BMI is associated with higher medical risk.  Weight management advised as adjunct to other disease management and risk reduction treatments            Discharge Instructions  Allergies as of 03/25/2024       Reactions   Eucalyptus Oil Shortness Of Breath   Codeine Other (See Comments)   Confusion, loopy    Doxycycline Other (See Comments)   Diarrhea/ abdominal cramping         Medication List     TAKE these medications    Accu-Chek Guide test strip Generic drug: glucose blood USE UP TO FOUR TIMES DAILY AS INSTRUCTED   Accu-Chek Softclix Lancets lancets USE UP TO FOUR TIMES DAILY AS DIRECTED   acetaminophen 325 MG tablet Commonly known as: TYLENOL Take 2 tablets (650 mg total) by mouth every 6 (six) hours as needed for mild pain (pain score 1-3), fever or headache.   albuterol 108 (90 Base) MCG/ACT inhaler Commonly known as: VENTOLIN HFA Inhale 1-2 puffs into the lungs every 4 (four) hours as needed for wheezing or shortness of breath.   amoxicillin-clavulanate 875-125 MG tablet Commonly known as: AUGMENTIN Take 1 tablet by mouth 2 (two) times daily for 7 days.   blood glucose meter kit and supplies Dispense based on patient and insurance preference. Use up to four times daily as directed. (FOR ICD-10 E10.9, E11.9).   calcium-vitamin D 500-200 MG-UNIT Tabs tablet Commonly known as: OSCAL WITH D Take 1  tablet by mouth in the morning and at bedtime.   Cepacol Extra Strength 15-2.6 MG Lozg Generic drug: Benzocaine-Menthol Use as directed 1 lozenge in the mouth or throat 3 times/day as needed-between meals & bedtime (cough, sore throat).   HYDROcodone bit-homatropine 5-1.5 MG/5ML syrup Commonly known as: HYCODAN Take 5 mLs by mouth every 8 (eight) hours as needed for cough.   menthol-cetylpyridinium 3 MG lozenge Commonly known as: CEPACOL Take 1 lozenge (3 mg total) by mouth as needed (cough, sore throat).   metFORMIN 750 MG 24 hr tablet Commonly known as: GLUCOPHAGE-XR TAKE 1 TABLET BY MOUTH DAILY  WITH BREAKFAST   pantoprazole 40 MG tablet Commonly known as: PROTONIX Take 1 tablet (40 mg total) by mouth 2 (two) times daily.   predniSONE 50 MG tablet Commonly known as: DELTASONE Take 1 tablet (50 mg total) by mouth daily with breakfast for 3 days. Start taking on: March 26, 2024   rosuvastatin 5 MG tablet Commonly known as:  CRESTOR TAKE 1 TABLET BY MOUTH DAILY         Follow-up Information     Sherlyn Hay, DO. Go to.   Specialty: Family Medicine Why: appointmetn tomorrow - hospital follow up, recommend arrange repeat labs and chest Xray some time this week Contact information: [address] [place] [address] [phone]         Sondra Come, MD. Schedule an appointment as soon as possible for a visit.   Specialty: Urology Why: hospital follow up asap for kidney stone and UTI Contact information: [address] [place] [address] [phone]                 Allergies  Allergen Reactions   Eucalyptus Oil Shortness Of Breath   Codeine Other (See Comments)    Confusion, loopy    Doxycycline Other (See Comments)    Diarrhea/ abdominal cramping     Subjective: pt feeling better this morning other than persistent cough, SOB has improved some   Discharge Exam: BP (!) 115/50 (BP Location: Left Arm)   Pulse 70   Temp 98.5 F (36.9 C)   Resp 18   Ht 5' (1.524 m)   Wt 55.9 kg   SpO2 93%   BMI 24.07 kg/m  General: Pt is alert, awake, not in acute distress Cardiovascular: RRR, S1/S2 +, no rubs, no gallops Respiratory: Coarse breath sounds but no wheezing Abdominal: Soft, NT, ND Extremities: no edema, no cyanosis     The results of significant diagnostics from this hospitalization (including imaging, microbiology, ancillary and laboratory) are listed below for reference.     Microbiology: Recent Results (from the past 240 hours)  Resp panel by RT-PCR (RSV, Flu A&B, Covid) Anterior Nasal Swab     Status: None   Collection Time: 03/23/24  5:24 PM   Specimen: Anterior Nasal Swab  Result Value Ref Range Status   SARS Coronavirus 2 by RT PCR NEGATIVE NEGATIVE Final    Comment: (NOTE) SARS-CoV-2 target nucleic acids are NOT DETECTED.  The SARS-CoV-2 RNA is generally detectable in upper respiratory specimens during the acute phase of  infection. The lowest concentration of SARS-CoV-2 viral copies this assay can detect is 138 copies/mL. A negative result does not preclude SARS-Cov-2 infection and should not be used as the sole basis for treatment or other patient management decisions. A negative result may occur with  improper specimen collection/handling, submission of specimen other than nasopharyngeal swab, presence of viral mutation(s) within  the areas targeted by this assay, and inadequate number of viral copies(<138 copies/mL). A negative result must be combined with clinical observations, patient history, and epidemiological information. The expected result is Negative.  Fact Sheet for Patients:  BloggerCourse.com  Fact Sheet for Healthcare Providers:  SeriousBroker.it  This test is no t yet approved or cleared by the [place] FDA and  has been authorized for detection and/or diagnosis of SARS-CoV-2 by FDA under an Emergency Use Authorization (EUA). This EUA will remain  in effect (meaning this test can be used) for the duration of the COVID-19 declaration under Section 564(b)(1) of the Act, 21 U.S.C.section 360bbb-3(b)(1), unless the authorization is terminated  or revoked sooner.       Influenza A by PCR NEGATIVE NEGATIVE Final   Influenza B by PCR NEGATIVE NEGATIVE Final    Comment: (NOTE) The Xpert Xpress SARS-CoV-2/FLU/RSV plus assay is intended as an aid in the diagnosis of influenza from Nasopharyngeal swab specimens and should not be used as a sole basis for treatment. Nasal washings and aspirates are unacceptable for Xpert Xpress SARS-CoV-2/FLU/RSV testing.  Fact Sheet for Patients: BloggerCourse.com  Fact Sheet for Healthcare Providers: SeriousBroker.it  This test is not yet approved or cleared by the [place] FDA and has been authorized for detection and/or diagnosis of SARS-CoV-2  by FDA under an Emergency Use Authorization (EUA). This EUA will remain in effect (meaning this test can be used) for the duration of the COVID-19 declaration under Section 564(b)(1) of the Act, 21 U.S.C. section 360bbb-3(b)(1), unless the authorization is terminated or revoked.     Resp Syncytial Virus by PCR NEGATIVE NEGATIVE Final    Comment: (NOTE) Fact Sheet for Patients: BloggerCourse.com  Fact Sheet for Healthcare Providers: SeriousBroker.it  This test is not yet approved or cleared by the [place] FDA and has been authorized for detection and/or diagnosis of SARS-CoV-2 by FDA under an Emergency Use Authorization (EUA). This EUA will remain in effect (meaning this test can be used) for the duration of the COVID-19 declaration under Section 564(b)(1) of the Act, 21 U.S.C. section 360bbb-3(b)(1), unless the authorization is terminated or revoked.  Performed at [facility], [address] [place], [place] [address]   Blood Culture (routine x 2)     Status: None (Preliminary result)   Collection Time: 03/23/24  5:24 PM   Specimen: BLOOD LEFT ARM  Result Value Ref Range Status   Specimen Description BLOOD LEFT ARM  Final   Special Requests   Final    BOTTLES DRAWN AEROBIC AND ANAEROBIC Blood Culture results may not be optimal due to an inadequate volume of blood received in culture bottles   Culture   Final    NO GROWTH 2 DAYS Performed at [facility], [address] [place], [place] [address]    Report Status PENDING  Incomplete  Blood Culture (routine x 2)     Status: None (Preliminary result)   Collection Time: 03/23/24  5:24 PM   Specimen: BLOOD RIGHT ARM  Result Value Ref Range Status   Specimen Description BLOOD RIGHT ARM  Final   Special Requests   Final    BOTTLES DRAWN AEROBIC AND ANAEROBIC Blood Culture results may not be optimal due to an inadequate volume of blood received in  culture bottles   Culture   Final    NO GROWTH 2 DAYS Performed at [facility], [address] [place], [place] [address]    Report Status PENDING  Incomplete  Expectorated Sputum Assessment w Gram Stain, Rflx to Resp Cult     Status: None   Collection Time: 03/24/24  9:21 AM   Specimen: Sputum  Result Value Ref Range Status   Specimen Description SPUTUM  Final   Special Requests NONE  Final   Sputum evaluation   Final    THIS SPECIMEN IS ACCEPTABLE FOR SPUTUM CULTURE Performed at [facility], [address], [place], [place] [address]    Report Status 03/24/2024 FINAL  Final  Culture, Respiratory w Gram Stain     Status: None (Preliminary result)   Collection Time: 03/24/24  9:21 AM   Specimen: SPU  Result Value Ref Range Status   Specimen Description   Final    SPUTUM Performed at [facility], [address], [place], [place] [address]    Special Requests   Final    NONE Reflexed from [id number] Performed at [facility], [address] [place], [place] [address]    Gram Stain   Final    RARE WBC PRESENT, PREDOMINANTLY PMN FEW SQUAMOUS EPITHELIAL CELLS PRESENT MODERATE GRAM POSITIVE COCCI MODERATE GRAM NEGATIVE RODS FEW GRAM POSITIVE RODS    Culture   Final    CULTURE REINCUBATED FOR BETTER GROWTH Performed at [facility], [address], [place], [place] [address]    Report Status PENDING  Incomplete  Respiratory (~20 pathogens) panel by PCR     Status: None   Collection Time: 03/24/24 11:04 AM   Specimen: Nasopharyngeal Swab; Respiratory  Result Value Ref Range Status   Adenovirus NOT DETECTED NOT DETECTED Final   Coronavirus 229E NOT DETECTED NOT DETECTED Final    Comment: (NOTE) The Coronavirus on the Respiratory Panel, DOES NOT test for the novel  Coronavirus (2019 nCoV)    Coronavirus HKU1 NOT DETECTED NOT DETECTED Final   Coronavirus NL63 NOT DETECTED NOT DETECTED Final   Coronavirus OC43 NOT DETECTED NOT  DETECTED Final   Metapneumovirus NOT DETECTED NOT DETECTED Final   Rhinovirus / Enterovirus NOT DETECTED NOT DETECTED Final   Influenza A NOT DETECTED NOT DETECTED Final   Influenza B NOT DETECTED NOT DETECTED Final   Parainfluenza Virus 1 NOT DETECTED NOT DETECTED Final   Parainfluenza Virus 2 NOT DETECTED NOT DETECTED Final   Parainfluenza Virus 3 NOT DETECTED NOT DETECTED Final   Parainfluenza Virus 4 NOT DETECTED NOT DETECTED Final   Respiratory Syncytial Virus NOT DETECTED NOT DETECTED Final   Bordetella pertussis NOT DETECTED NOT DETECTED Final   Bordetella Parapertussis NOT DETECTED NOT DETECTED Final   Chlamydophila pneumoniae NOT DETECTED NOT DETECTED Final   Mycoplasma pneumoniae NOT DETECTED NOT DETECTED Final    Comment: Performed at [facility], [address] [place], [place] [address]     Labs: BNP (last 3 results) Recent Labs    03/23/24 1514  BNP 12.0   Basic Metabolic Panel: Recent Labs  Lab 03/23/24 1514 03/24/24 0444  NA 137 136  K 3.7 3.8  CL 105 109  CO2 23 20*  GLUCOSE 161* 220*  BUN 15 12  CREATININE 1.13* 0.94  CALCIUM 9.2 8.6*   Liver Function Tests: Recent Labs  Lab 03/23/24 1724  AST 14*  ALT 11  ALKPHOS 47  BILITOT 0.8  PROT 7.7  ALBUMIN 3.6   No results for input(s): "LIPASE", "AMYLASE" in the last 168 hours. No results for input(s): "AMMONIA" in the last 168 hours. CBC: Recent Labs  Lab 03/23/24 1514 03/24/24 0444  WBC 7.6 7.7  HGB 9.9* 8.9*  HCT 31.0* 28.2*  MCV 78.7* 78.8*  PLT 267 247   Cardiac Enzymes: No results for input(s): "CKTOTAL", "CKMB", "CKMBINDEX", "TROPONINI" in the last 168 hours. BNP: Invalid input(s): "POCBNP" CBG: Recent Labs  Lab 03/24/24 1132 03/24/24 1643 03/24/24 2133 03/25/24 0813 03/25/24 1155  GLUCAP 228* 206* 230* 130* 216*   D-Dimer No results for input(s): "DDIMER" in the last 72 hours. Hgb A1c No results for input(s): "HGBA1C" in the last 72 hours. Lipid Profile No  results for input(s): "CHOL", "HDL", "LDLCALC", "TRIG", "CHOLHDL", "LDLDIRECT" in the last 72 hours. Thyroid function studies No results for input(s): "TSH", "T4TOTAL", "T3FREE", "THYROIDAB" in the last 72 hours.  Invalid input(s): "FREET3" Anemia work up No results for input(s): "VITAMINB12", "FOLATE", "FERRITIN", "TIBC", "IRON", "RETICCTPCT" in the last 72 hours. Urinalysis    Component Value Date/Time   COLORURINE YELLOW (A) 03/23/2024 1724   APPEARANCEUR TURBID (A) 03/23/2024 1724   APPEARANCEUR Cloudy (A) 03/15/2024 1410   LABSPEC 1.017 03/23/2024 1724   PHURINE 5.0 03/23/2024 1724   GLUCOSEU NEGATIVE 03/23/2024 1724   HGBUR MODERATE (A) 03/23/2024 1724   BILIRUBINUR NEGATIVE 03/23/2024 1724   BILIRUBINUR Negative 03/15/2024 1410   KETONESUR NEGATIVE 03/23/2024 1724   PROTEINUR 100 (A) 03/23/2024 1724   UROBILINOGEN 0.2 12/12/2019 0933   NITRITE NEGATIVE 03/23/2024 1724   LEUKOCYTESUR LARGE (A) 03/23/2024 1724   Sepsis Labs Recent Labs  Lab 03/23/24 1514 03/24/24 0444  WBC 7.6 7.7   Microbiology Recent Results (from the past 240 hours)  Resp panel by RT-PCR (RSV, Flu A&B, Covid) Anterior Nasal Swab     Status: None   Collection Time: 03/23/24  5:24 PM   Specimen: Anterior Nasal Swab  Result Value Ref Range Status   SARS Coronavirus 2 by RT PCR NEGATIVE NEGATIVE Final    Comment: (NOTE) SARS-CoV-2 target nucleic acids are NOT DETECTED.  The SARS-CoV-2 RNA is generally detectable in upper respiratory specimens during the acute phase of infection. The lowest concentration of SARS-CoV-2 viral copies this assay can detect is 138 copies/mL. A negative result does not preclude SARS-Cov-2 infection and should not be used as the sole basis for treatment or other patient management decisions. A negative result may occur with  improper specimen collection/handling, submission of specimen other than nasopharyngeal swab, presence of viral mutation(s) within the areas  targeted by this assay, and inadequate number of viral copies(<138 copies/mL). A negative result must be combined with clinical observations, patient history, and epidemiological information. The expected result is Negative.  Fact Sheet for Patients:  BloggerCourse.com  Fact Sheet for Healthcare Providers:  SeriousBroker.it  This test is no t yet approved or cleared by the [place] FDA and  has been authorized for detection and/or diagnosis of SARS-CoV-2 by FDA under an Emergency Use Authorization (EUA). This EUA will remain  in effect (meaning this test can be used) for the duration of the COVID-19 declaration under Section 564(b)(1) of the Act, 21 U.S.C.section 360bbb-3(b)(1), unless the authorization is terminated  or revoked sooner.       Influenza A by PCR NEGATIVE NEGATIVE Final   Influenza B by PCR NEGATIVE NEGATIVE Final    Comment: (NOTE) The Xpert Xpress SARS-CoV-2/FLU/RSV plus assay is intended as an aid in the diagnosis of influenza from Nasopharyngeal swab specimens and should not be used as a sole basis for treatment. Nasal washings and aspirates are unacceptable for Xpert Xpress SARS-CoV-2/FLU/RSV testing.  Fact Sheet for Patients: BloggerCourse.com  Fact Sheet for Healthcare Providers: SeriousBroker.it  This test is not yet approved or cleared by the [place] FDA and has been authorized for detection and/or diagnosis of SARS-CoV-2 by FDA under an Emergency Use Authorization (EUA). This EUA will remain in effect (meaning this test can be used) for the duration of the COVID-19 declaration under Section 564(b)(1) of the Act, 21 U.S.C. section 360bbb-3(b)(1), unless the authorization is terminated or revoked.     Resp Syncytial Virus by PCR NEGATIVE NEGATIVE Final    Comment: (NOTE) Fact Sheet for  Patients: BloggerCourse.com  Fact Sheet for Healthcare Providers: SeriousBroker.it  This test is not yet approved or cleared by the [place] FDA and has been authorized for detection and/or diagnosis of SARS-CoV-2 by FDA under an Emergency Use Authorization (EUA). This EUA will remain in effect (meaning this test can be used) for the duration of the COVID-19 declaration under Section 564(b)(1) of the Act, 21 U.S.C. section 360bbb-3(b)(1), unless the authorization is terminated or revoked.  Performed at [facility], [address] [place], [place] [address]   Blood Culture (routine x 2)     Status: None (Preliminary result)   Collection Time: 03/23/24  5:24 PM   Specimen: BLOOD LEFT ARM  Result Value Ref Range Status   Specimen Description BLOOD LEFT ARM  Final   Special Requests   Final    BOTTLES DRAWN AEROBIC AND ANAEROBIC Blood Culture results may not be optimal due to an inadequate volume of blood received in culture bottles   Culture   Final    NO GROWTH 2 DAYS Performed at [facility], [address] [place], [place] [address]    Report Status PENDING  Incomplete  Blood Culture (routine x 2)     Status: None (Preliminary result)   Collection Time: 03/23/24  5:24 PM   Specimen: BLOOD RIGHT ARM  Result Value Ref Range Status   Specimen Description BLOOD RIGHT ARM  Final   Special Requests   Final    BOTTLES DRAWN AEROBIC AND ANAEROBIC Blood Culture results may not be optimal due to an inadequate volume of blood received in culture bottles   Culture   Final    NO GROWTH 2 DAYS Performed at [facility], [address], [place], [place] [address]    Report Status PENDING  Incomplete  Expectorated Sputum Assessment w Gram Stain, Rflx to Resp Cult     Status: None   Collection Time: 03/24/24  9:21 AM   Specimen: Sputum  Result Value Ref Range Status   Specimen Description SPUTUM  Final    Special Requests NONE  Final   Sputum evaluation   Final    THIS SPECIMEN IS ACCEPTABLE FOR SPUTUM CULTURE Performed at [facility], [address], [place], [place] [address]    Report Status 03/24/2024 FINAL  Final  Culture, Respiratory w Gram Stain     Status: None (Preliminary result)   Collection Time: 03/24/24  9:21 AM   Specimen: SPU  Result Value Ref Range Status   Specimen Description   Final    SPUTUM Performed at [facility], [address] [place], [place] [address]    Special Requests   Final    NONE Reflexed from [id number] Performed at [facility], [address] [place], [place] [address]    Gram Stain   Final    RARE WBC PRESENT, PREDOMINANTLY PMN FEW SQUAMOUS EPITHELIAL CELLS PRESENT MODERATE GRAM POSITIVE COCCI MODERATE  GRAM NEGATIVE RODS FEW GRAM POSITIVE RODS    Culture   Final    CULTURE REINCUBATED FOR BETTER GROWTH Performed at [facility], [address], [place], [place] [address]    Report Status PENDING  Incomplete  Respiratory (~20 pathogens) panel by PCR     Status: None   Collection Time: 03/24/24 11:04 AM   Specimen: Nasopharyngeal Swab; Respiratory  Result Value Ref Range Status   Adenovirus NOT DETECTED NOT DETECTED Final   Coronavirus 229E NOT DETECTED NOT DETECTED Final    Comment: (NOTE) The Coronavirus on the Respiratory Panel, DOES NOT test for the novel  Coronavirus (2019 nCoV)    Coronavirus HKU1 NOT DETECTED NOT DETECTED Final   Coronavirus NL63 NOT DETECTED NOT DETECTED Final   Coronavirus OC43 NOT DETECTED NOT DETECTED Final   Metapneumovirus NOT DETECTED NOT DETECTED Final   Rhinovirus / Enterovirus NOT DETECTED NOT DETECTED Final   Influenza A NOT DETECTED NOT DETECTED Final   Influenza B NOT DETECTED NOT DETECTED Final   Parainfluenza Virus 1 NOT DETECTED NOT DETECTED Final   Parainfluenza Virus 2 NOT DETECTED NOT DETECTED Final   Parainfluenza Virus 3 NOT DETECTED NOT DETECTED  Final   Parainfluenza Virus 4 NOT DETECTED NOT DETECTED Final   Respiratory Syncytial Virus NOT DETECTED NOT DETECTED Final   Bordetella pertussis NOT DETECTED NOT DETECTED Final   Bordetella Parapertussis NOT DETECTED NOT DETECTED Final   Chlamydophila pneumoniae NOT DETECTED NOT DETECTED Final   Mycoplasma pneumoniae NOT DETECTED NOT DETECTED Final    Comment: Performed at [facility], [address] [place], [place] [address]   Imaging CT RENAL STONE STUDY Result Date: 03/23/2024 CLINICAL DATA:  Symptomatic urolithiasis. EXAM: CT ABDOMEN AND PELVIS WITHOUT CONTRAST TECHNIQUE: Multidetector CT imaging of the abdomen and pelvis was performed following the standard protocol without IV contrast. RADIATION DOSE REDUCTION: This exam was performed according to the departmental dose-optimization program which includes automated exposure control, adjustment of the mA and/or kV according to patient size and/or use of iterative reconstruction technique. COMPARISON:  CT abdomen and pelvis 01/21/2017 FINDINGS: Lower chest: Patchy multifocal airspace disease is present in the bilateral lower lobes. Hepatobiliary: No focal liver abnormality is seen. No gallstones, gallbladder wall thickening, or biliary dilatation. Pancreas: Unremarkable. No pancreatic ductal dilatation or surrounding inflammatory changes. Spleen: Normal in size without focal abnormality. Adrenals/Urinary Tract: Bilateral adrenal glands are within normal limits. There is a new staghorn calculus in the right kidney involving the right mid and lower pole calices, pelvis and proximal ureter. There is mild hydronephrosis of the superior pole the right kidney. There are punctate nonobstructing left renal calculi. There is no left-sided hydronephrosis. There is no perinephric stranding or fluid collection. The adrenal glands are within normal limits. The bladder is within normal limits. Stomach/Bowel: There is a moderate-sized hiatal hernia. Stomach  is otherwise within normal limits. The appendix is not seen. No evidence of bowel wall thickening, distention, or inflammatory changes. Vascular/Lymphatic: Aortic atherosclerosis. No enlarged abdominal or pelvic lymph nodes. Reproductive: Status post hysterectomy. No adnexal masses. Other: No abdominal wall hernia or abnormality. No abdominopelvic ascites. Musculoskeletal: No fracture is seen. IMPRESSION: 1. New right staghorn calculus with mild right hydronephrosis.  The 2. Punctate nonobstructing left renal calculi. 3. Moderate size hiatal hernia. 4. Bilateral multifocal lower lobe airspace disease compatible with multiple focal pneumonia. Recommend follow-up PA and lateral chest x-ray to confirm resolution in 4-6 weeks. Electronically Signed   By: Darliss Cheney  M.D.   On: 03/23/2024 20:08   DG Chest 2 View Result Date: 03/23/2024 CLINICAL DATA:  Shortness of breath. EXAM: CHEST - 2 VIEW COMPARISON:  July 05, 2018. FINDINGS: The heart size and mediastinal contours are within normal limits. Right lung is clear. Mild left posterior basilar opacity is noted concerning for possible pneumonia or atelectasis. The visualized skeletal structures are unremarkable. IMPRESSION: Mild left posterior basilar opacity is noted concerning for possible pneumonia or atelectasis. Followup PA and lateral chest X-ray is recommended in 3-4 weeks following trial of antibiotic therapy to ensure resolution and exclude underlying malignancy. Electronically Signed   By: Lupita Raider M.D.   On: 03/23/2024 15:54      Time coordinating discharge: over 30 minutes  SIGNED:  Sunnie Nielsen DO [facility]

## 2024-03-26 ENCOUNTER — Ambulatory Visit (INDEPENDENT_AMBULATORY_CARE_PROVIDER_SITE_OTHER): Payer: Self-pay | Admitting: Family Medicine

## 2024-03-26 ENCOUNTER — Telehealth: Payer: Self-pay | Admitting: Urology

## 2024-03-26 ENCOUNTER — Encounter: Payer: Self-pay | Admitting: Family Medicine

## 2024-03-26 VITALS — BP 113/63 | HR 76 | Temp 98.4°F | Ht 60.0 in | Wt 121.0 lb

## 2024-03-26 DIAGNOSIS — J189 Pneumonia, unspecified organism: Secondary | ICD-10-CM | POA: Diagnosis not present

## 2024-03-26 DIAGNOSIS — Z79899 Other long term (current) drug therapy: Secondary | ICD-10-CM | POA: Diagnosis not present

## 2024-03-26 DIAGNOSIS — K449 Diaphragmatic hernia without obstruction or gangrene: Secondary | ICD-10-CM | POA: Diagnosis not present

## 2024-03-26 DIAGNOSIS — E559 Vitamin D deficiency, unspecified: Secondary | ICD-10-CM

## 2024-03-26 DIAGNOSIS — Z09 Encounter for follow-up examination after completed treatment for conditions other than malignant neoplasm: Secondary | ICD-10-CM

## 2024-03-26 DIAGNOSIS — D509 Iron deficiency anemia, unspecified: Secondary | ICD-10-CM

## 2024-03-26 DIAGNOSIS — E1169 Type 2 diabetes mellitus with other specified complication: Secondary | ICD-10-CM

## 2024-03-26 DIAGNOSIS — N200 Calculus of kidney: Secondary | ICD-10-CM | POA: Diagnosis not present

## 2024-03-26 DIAGNOSIS — E785 Hyperlipidemia, unspecified: Secondary | ICD-10-CM

## 2024-03-26 LAB — CULTURE, RESPIRATORY W GRAM STAIN

## 2024-03-26 LAB — LEGIONELLA PNEUMOPHILA SEROGP 1 UR AG: L. pneumophila Serogp 1 Ur Ag: NEGATIVE

## 2024-03-26 NOTE — Progress Notes (Signed)
 Established patient visit   Patient: Holly Matthews   DOB: March 11, 1951   73 y.o. Female  MRN: [id number] Visit Date: 03/26/2024  Today's healthcare provider: Sherlyn Hay, DO   Chief Complaint  Patient presents with   Hospitalization Follow-up    Patient was in [facility] for pneumonia and is here for follow up.  Patient wants her CBC checked due to low hgb.  Patient also has upcoming kidney stone surgery and wants  CXR at the end of the week.  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 She was brought to clinic by her son this morning.  She was recently hospitalized (discharged yesterday) for community-acquired pneumonia and a urinary tract infection, which led to sepsis. The pneumonia was presumed to be caused by Streptococcus pneumoniae, as the antigen was detected in her urine. The urinary tract infection was caused by Klebsiella oxytoca, sensitive to all antibiotics tested except amoxicillin, but sensitive to Augmentin. She was treated with Rocephin in the hospital and discharged on Augmentin, which she is still taking. During her hospitalization, she experienced acute hypoxic respiratory failure and required four liters of oxygen but was weaned back to room air. Her renal CT showed a staghorn calculus in her right kidney. Blood cultures were negative for any growth.  Sputum culture is still pending.  Respiratory PCR panel and specific test for COVID, flu A/B and RSV were negative.  She has persistent coughing, which she describes as 'awful' and difficult to control. She was prescribed hycodan for the cough, along with Cepacol tablets and an inhaler. Her oxygen saturation  at home is around 91%, but it was 97% during the visit.  She is concerned about her hemoglobin levels, which dropped from 13 to 9.9 and then to 8.9 during her hospital stay. She was advised this drop was due to the fluids administered during her treatment for sepsis. She is eager to have her hemoglobin rechecked.  She has a history of a staghorn calculus and hydronephrosis, which was identified during a renal CT scan in the hospital. She was in the process of getting it evaluated when she was hospitalized.  She has been advised that the stone predisposes her to urinary tract infections.  She has not yet scheduled her follow-up with urology but will be doing so after she leaves here today.  She is currently taking Augmentin and prednisone. She also takes rosuvastatin, which is managed through a mail-order service. She is due for a cholesterol panel and A1c test. She reports that her blood sugars have been running between 130 and 140 at home.      Medications: Outpatient Medications Prior to Visit  Medication Sig   Accu-Chek Softclix Lancets lancets USE UP TO FOUR TIMES DAILY AS DIRECTED   acetaminophen (TYLENOL) 325 MG tablet Take 2 tablets (650 mg total) by mouth every 6 (six) hours as needed for mild pain (pain score 1-3), fever or headache.   albuterol (VENTOLIN HFA) 108 (90 Base) MCG/ACT inhaler Inhale 1-2 puffs into the lungs every 4 (four) hours as needed for wheezing or shortness of breath.   amoxicillin-clavulanate (AUGMENTIN) 875-125 MG tablet Take 1 tablet by mouth 2 (two) times daily for  7 days.   Benzocaine-Menthol (CEPACOL EXTRA STRENGTH) 15-2.6 MG LOZG Use as directed 1 lozenge in the mouth or throat 3 times/day as needed-between meals & bedtime (cough, sore throat).   blood glucose meter kit and supplies Dispense based on patient and insurance preference. Use up to four times daily as directed. (FOR ICD-10 E10.9, E11.9).   calcium-vitamin D (OSCAL WITH D) 500-200 MG-UNIT TABS  tablet Take 1 tablet by mouth in the morning and at bedtime.   glucose blood (ACCU-CHEK GUIDE) test strip USE UP TO FOUR TIMES DAILY AS INSTRUCTED   HYDROcodone bit-homatropine (HYCODAN) 5-1.5 MG/5ML syrup Take 5 mLs by mouth every 8 (eight) hours as needed for cough.   menthol-cetylpyridinium (CEPACOL) 3 MG lozenge Take 1 lozenge (3 mg total) by mouth as needed (cough, sore throat).   metFORMIN (GLUCOPHAGE-XR) 750 MG 24 hr tablet TAKE 1 TABLET BY MOUTH DAILY  WITH BREAKFAST   pantoprazole (PROTONIX) 40 MG tablet Take 1 tablet (40 mg total) by mouth 2 (two) times daily.   predniSONE (DELTASONE) 50 MG tablet Take 1 tablet (50 mg total) by mouth daily with breakfast for 3 days.   rosuvastatin (CRESTOR) 5 MG tablet TAKE 1 TABLET BY MOUTH DAILY   No facility-administered medications prior to visit.    Review of Systems  Constitutional:  Positive for fatigue. Negative for appetite change, chills and fever.  Respiratory:  Positive for cough and shortness of breath. Negative for chest tightness.   Cardiovascular:  Negative for chest pain and palpitations.  Gastrointestinal:  Negative for abdominal pain, nausea and vomiting.  Neurological:  Negative for dizziness and weakness.        Objective    BP 113/63 (BP Location: Left Arm, Patient Position: Sitting, Cuff Size: Normal)   Pulse 76   Temp 98.4 F (36.9 C) (Oral)   Ht 5' (1.524 m)   Wt 121 lb (54.9 kg)   SpO2 97%   BMI 23.63 kg/m     Physical Exam Constitutional:      Appearance: Normal appearance.  HENT:     Head: Normocephalic and atraumatic.  Eyes:     General: No scleral icterus.    Extraocular Movements: Extraocular movements intact.     Conjunctiva/sclera: Conjunctivae normal.  Cardiovascular:     Rate and Rhythm: Normal rate and regular rhythm.     Pulses: Normal pulses.     Heart sounds: Normal heart sounds.  Pulmonary:     Effort: Pulmonary effort is normal. No respiratory distress.     Breath sounds: Rhonchi  (at bases) present.  Abdominal:     General: Bowel sounds are normal. There is no distension.     Palpations: Abdomen is soft. There is no mass.     Tenderness: There is no abdominal tenderness. There is no guarding.  Musculoskeletal:     Right lower leg: No edema.     Left lower leg: No edema.  Skin:    General: Skin is warm and dry.  Neurological:     Mental Status: She is alert and oriented to person, place, and time. Mental status is at baseline.  Psychiatric:        Mood and Affect: Mood normal.        Behavior: Behavior normal.      No results found for any visits on 03/26/24.  Assessment & Plan    Community acquired pneumonia, unspecified laterality -     CBC with Differential/Platelet -     Basic metabolic panel  with GFR -     DG Chest 2 View; Future  Hospital discharge follow-up -     CBC with Differential/Platelet -     Basic metabolic panel with GFR -     DG Chest 2 View; Future  Right nephrolithiasis -     Basic metabolic panel with GFR  Hyperlipidemia associated with type 2 diabetes mellitus (HCC) -     Microalbumin / creatinine urine ratio -     Hemoglobin A1c -     Lipid panel -     Vitamin B12  Microcytic anemia -     CBC with Differential/Platelet  High risk medication use -     Vitamin B12  Vitamin D deficiency -     VITAMIN D 25 Hydroxy (Vit-D Deficiency, Fractures)  Hiatal hernia    Hospital discharge follow-up for community Acquired Pneumonia Recent hospitalization for community-acquired pneumonia with subsequent sepsis, likely due to Streptococcus pneumoniae. Significant cough persists, expected during recovery. Sputum cultures pending. - Continue Augmentin and prednisone. - Complete chest x-ray at the end of the week. - Provide cough support with Hycodan and with Cepacol tablets.  Microcytic anemia Anemia likely secondary to hemodilution from sepsis treatment. Hemoglobin dropped from 13 to 8.9 during hospitalization. - Order CBC to  recheck hemoglobin and hematocrit at the end of the week.  Urinary Tract Infection Recent UTI caused by Klebsiella oxytoca, sensitive to Augmentin. Treated with Rocephin in hospital, discharged on Augmentin. - Continue Augmentin. - Schedule follow-up with urology for kidney stone management.  Staghorn Calculus Staghorn calculus in right kidney identified on renal CT. Requires urology follow-up for removal. - Schedule urology appointment for kidney stone management.  Diabetes Mellitus with Chronic Kidney Disease Stage 2 Diabetes management requires monitoring. A1c due for re-evaluation. Home blood sugars between 130 and 140.  Chronic kidney disease stage 2. Requires monitoring of kidney function.  - Order A1c test at the end of the week. - Ordered uACR - Check vitamin B12 as patient is chronically on metformin  Hyperlipidemia Hyperlipidemia management with rosuvastatin. Medication received via mail order with automatic refills. - Order cholesterol panel at the end of the week.  General Health Maintenance Due for shingles vaccine and COVID booster, deferred until recovery. Colonoscopy postponed due to illness. - Recommend shingles vaccine and COVID booster after recovery. - Reschedule colonoscopy.  Hiatal hernia Noted on CT scan during hospitalization.  No acute concerns.   Follow-up Follow-up visit to monitor recovery and reassess health status. Advised rest and avoidance of work during recovery. - Schedule follow-up appointment in 4-6 weeks.  Return sooner if not improving   Return in about 6 weeks (around 05/10/2024), or if symptoms worsen or fail to improve, for mAWV (with AWV if available//PCP if not) and chronic f/u.      I discussed the assessment and treatment plan with the patient  The patient was provided an opportunity to ask questions and all were answered. The patient agreed with the plan and demonstrated an understanding of the instructions.   The patient was  advised to call back or seek an in-person evaluation if the symptoms worsen or if the condition fails to improve as anticipated.    Sherlyn Hay, DO  [facility] [phone] (phone) [phone] (fax)  [facility]

## 2024-03-26 NOTE — Telephone Encounter (Signed)
 She was seen in the hospital over the weekend for Pneumonia and was admitted. While she was there they did the CT Scan for her kidney stone that she still has not passed. Her discharge says to f/u with Dr. Richardo Hanks. She would like a call back to discuss what she needs to do next.

## 2024-03-26 NOTE — Telephone Encounter (Signed)
 Per her discharge please schedule her an appointment to discuss stone management first available.

## 2024-03-26 NOTE — Patient Instructions (Signed)
 Call and schedule urology appointment

## 2024-03-27 ENCOUNTER — Encounter: Payer: Self-pay | Admitting: Family Medicine

## 2024-03-27 ENCOUNTER — Encounter: Payer: Self-pay | Admitting: Urology

## 2024-03-27 ENCOUNTER — Ambulatory Visit: Admitting: Urology

## 2024-03-27 ENCOUNTER — Other Ambulatory Visit: Payer: Self-pay

## 2024-03-27 VITALS — BP 122/71 | Ht 60.0 in | Wt 121.0 lb

## 2024-03-27 DIAGNOSIS — N200 Calculus of kidney: Secondary | ICD-10-CM

## 2024-03-27 NOTE — Progress Notes (Signed)
   03/27/2024 9:17 AM   Holly Matthews 1951/01/16 [id number]  Reason for visit: Follow up right staghorn calculus  HPI: 73 year old female who I saw on 03/15/2024 for a abdominal ultrasound showing a possible right sided large renal stone and mild right hydronephrosis.  CT shows a complete right staghorn stone measuring 3 cm x 5 cm and extending into the right UPJ with some mild right upstream hydronephrosis.  She is asymptomatic with no recurrent UTIs or flank pain.  She was recently hospitalized for pneumonia and I reviewed those notes at length.  Urine culture from 03/15/2024 grew Klebsiella, but this is more consistent with asymptomatic bacteriuria as she has never had urinary symptoms or flank pain.  She was recently started on Augmentin for pneumonia, which will cover the Klebsiella for pre-op sterilization, will repeat UA prior to surgery.  We discussed various treatment options for urolithiasis including observation with or without medical expulsive therapy, shockwave lithotripsy (SWL), ureteroscopy and laser lithotripsy with stent placement, and percutaneous nephrolithotomy.  We discussed that management is based on stone size, location, density, patient co-morbidities, and patient preference. PCNL is the favored treatment for stones >2cm. It involves a small incision in the flank, with complete fragmentation of stones and removal. It has the highest stone free rate, but also the highest complication rate. Possible complications include bleeding, infection/sepsis, injury to surrounding organs including the pleura, and collecting system injury.  We reviewed other options like observation with high risk of recurrent UTIs, worsening renal function, or flank pain.  After an extensive discussion of the risks and benefits of the above treatment options, the patient would like to proceed with right PCNL.  Schedule right PCNL, recent hospitalization for pneumonia and will need to wait at least 2 to 4  weeks   Sondra Come, MD  [facility] [address] [place], [place] [address] [phone]

## 2024-03-27 NOTE — Patient Instructions (Addendum)
 Percutaneous Nephrolithotomy Percutaneous nephrolithotomy is a procedure to remove kidney stones. Kidney stones are rock-like masses that form inside the kidneys. You may need this procedure if: You have kidney stones that are bigger than 2 cm (0.78 inch) wide. Your kidney stones have an odd shape to them. Other treatments have not worked. You have an infection brought on by the kidney stones. Tell a health care provider about: Any allergies you have. All medicines you are taking, including vitamins, herbs, eye drops, creams, and over-the-counter medicines. Any problems you or family members have had with anesthesia. Any bleeding problems you have. Any surgeries you have had. Any medical conditions you have. Whether you are pregnant or may be pregnant. What are the risks? Your health care provider will talk with you about risks. These may include: Infection. Bleeding. This may include blood in your pee (urine). Allergic reactions to medicines. Damage to other structures or organs. Damage to the kidney. Holes in the kidney. In most cases, these holes heal on their own. Numbness or tingling. Not being able to remove all the stones. If this happens, you may need more treatment. What happens before the procedure? When to stop eating and drinking Follow instructions from your provider about what you may eat and drink. These may include: 8 hours before your procedure Stop eating most foods. Do not eat meat, fried foods, or fatty foods. Eat only light foods, such as toast or crackers. All liquids are okay except energy drinks and alcohol. 6 hours before your procedure Stop eating. Drink only clear liquids, such as water, clear fruit juice, black coffee, plain tea, and sports drinks. Do not drink energy drinks or alcohol. 2 hours before your procedure Stop drinking all liquids. You may be allowed to take medicines with small sips of water. If you do not follow your provider's  instructions, your procedure may be delayed or canceled. Medicines Ask your provider about: Changing or stopping your regular medicines. These include any diabetes medicines or blood thinners you take. Taking medicines such as aspirin and ibuprofen. These medicines can thin your blood. Do not take them unless your provider tells you to. Taking over-the-counter medicines, vitamins, herbs, and supplements. Tests You may have tests done. These may include: Blood and pee tests. An electrocardiogram (EKG). This checks how well your heart is working. Imaging studies. These may include an ultrasound or CT scan. They can help find: The size and number (stone burden) of the kidney stones. Where the kidney stones are. Surgery safety Ask your provider: How your surgery site will be marked. What steps will be taken to help prevent infection. These steps may include: Removing hair at the surgery site. Washing skin with a soap that kills germs. Receiving antibiotics. General instructions Plan to have a responsible adult: Take you home from the hospital. You will not be allowed to drive. Care for you for the time you are told. What happens during the procedure?  An IV will be inserted into one of your veins. You will be given: A sedative. This helps you relax. Anesthesia. This keeps you from feeling pain. It will numb certain areas make you fall asleep for surgery. A soft tube (catheter) will be put in your bladder. This will drain pee out of your body during and after the procedure. Your surgeon will make a small incision in your lower back. A tube will be put through the incision into your kidney. Each kidney stone will be removed through this tube. Larger stones may  need to be broken up with a beam of light (laser) or other tools. After all of the stones have been removed, your provider may put in another tube to help drain pee. This may be: A stent. This is put into the tube that connects the  bladder to the kidney (ureter). It helps drain pee from your kidney to your bladder. A nephrostomy tube. This is put in your kidney. It comes out through the incision in your lower back. It helps drain pee or any fluid that builds up while your kidney heals. The incision may be closed with stitches (sutures). A bandage (dressing) will be placed over the incision area. The procedure may vary among providers and hospitals. What happens after the procedure? Your blood pressure, heart rate, breathing rate, and blood oxygen level will be monitored until you leave the hospital or clinic. Any tubes will be removed after 1-2 days if there is only a small amount of blood in your pee. This information is not intended to replace advice given to you by your health care provider. Make sure you discuss any questions you have with your health care provider. Asymptomatic Bacteriuria Asymptomatic bacteriuria is when you have a lot of germs called bacteria in your pee (urine), but they don't cause symptoms.  You may not need treatment for this condition. But you may be more likely to get an infection in the future. What are the causes? You may get more germs in your pee because of: Germs going into your urinary tract. Your urinary tract includes your kidneys, ureters, bladder, and urethra. Germs can get into your urinary tract during sex. A blockage in your urinary tract. This may be from a kidney stone or from an abnormal growth of cells called a tumor. Bladder problems that stop the bladder from emptying. What increases the risk? You're more likely to get this condition if: You have diabetes. You're an older adult. You're even more likely to get it if you live in a long-term care facility. You're female. You're pregnant. You have kidney stones. You've had a kidney transplant. You have a soft tube called a catheter that drains your pee. What are the signs or symptoms? There are no symptoms. How is this  diagnosed? This condition is diagnosed with a pee test. It may be found when a sample of pee is taken to treat another condition, such as a problem with your kidney. If you're in your first trimester of pregnancy, you may be screened for this condition. How is this treated? In most cases, treatment isn't needed. Treating the condition can lead to other problems, such as: A yeast infection. The growth of antibiotic-resistant bacteria. These are germs that don't respond to treatment. But some people need to take antibiotics to prevent a kidney infection. You may need treatment if: You're having a procedure that affects your urinary tract. You've had a kidney transplant. You're pregnant. A kidney infection during pregnancy can lead to: Early labor. Very low birth weight. Newborn death. Follow these instructions at home: Medicines Take your medicines only as told. Take your antibiotics as told. Do not stop taking them even if you start to feel better. General instructions Closely watch your condition for any changes. Drink enough fluid to keep your pee pale yellow. Pee more often to keep your bladder empty. If you're female, keep the area around your vagina and butt clean. Wipe from front to back after you pee or poop. Use each tissue only once when you  wipe. Contact a health care provider if: You have symptoms of an infection. These symptoms may include: A burning feeling, or pain when you pee. A strong need to pee. Peeing more often. Pee that turns discolored or cloudy. Blood in your pee. Pee that smells bad or odd. A fever or chills. You have very bad pain in your back or lower belly. You faint. This information is not intended to replace advice given to you by your health care provider. Make sure you discuss any questions you have with your health care provider. Document Revised: 04/12/2023 Document Reviewed: 04/12/2023 Elsevier Patient Education  2024 [employer].

## 2024-03-27 NOTE — Progress Notes (Unsigned)
 Surgical Physician Order Form [facility] [place]  Dr. Richardo Hanks, MD  * Scheduling expectation : 3+ weeks(recent pneumonia) please try to schedule a Thursday morning at 730 with shockwave cases to follow starting at 10 AM(we can discuss scheduling)  *Length of Case: 2 hours  *Clearance needed: no  *Anticoagulation Instructions: Hold all anticoagulants  *Aspirin Instructions: Hold Aspirin  *Post-op visit Date/Instructions: Stent removal 1-2 week  *Diagnosis: Right Nephrolithiasis  *Procedure: right PCNL (16109)   Additional orders: N/A  -Admit type: Observation  -Anesthesia: General  -VTE Prophylaxis Standing Order SCD's       Other:   -Standing Lab Orders Per Anesthesia    Lab other: UA&Urine Culture  -Standing Test orders EKG/Chest x-ray per Anesthesia       Test other:   - Medications:  Cipro 400mg  IV  -Other orders:  N/A

## 2024-03-28 ENCOUNTER — Telehealth: Payer: Self-pay | Admitting: Family Medicine

## 2024-03-28 ENCOUNTER — Telehealth: Payer: Self-pay

## 2024-03-28 LAB — CULTURE, BLOOD (ROUTINE X 2)
Culture: NO GROWTH
Culture: NO GROWTH

## 2024-03-28 NOTE — Telephone Encounter (Signed)
  Per Dr. Richardo Hanks, Patient is to be scheduled for Right Percutaneous Nephrolithotomy   Mrs. Crute was contacted and possible surgical dates were discussed, Thursday May 15th, 2025 was agreed upon for surgery.   Patient was instructed that Dr. Richardo Hanks will require them to provide a pre-op UA & CX prior to surgery. This was ordered and scheduled drop off appointment was made for 04/19/2024.    Patient was directed to call [phone] between 1-3pm the day before surgery to find out surgical arrival time.  Instructions were given not to eat or drink from midnight on the night before surgery and have a driver for the day of surgery. On the surgery day patient was instructed to enter through the [facility] entrance of [facility] report the Same Day Surgery desk.   Pre-Admit Testing will be in contact via phone to set up an interview with the anesthesia team to review your history and medications prior to surgery.   Reminder of this information was sent via MyChart to the patient.

## 2024-03-28 NOTE — Telephone Encounter (Signed)
 Received fax from [facility] for surgical clearance.  Was placed in Dr. Rexanne Mano box

## 2024-03-28 NOTE — Progress Notes (Addendum)
   [facility] Surgical Posting Form  Surgery Date: Date: 05/03/2024  Surgeon: Dr. Legrand Rams, MD  Inpt ( No  )   Outpt (No)   Obs ( Yes  )   Diagnosis: Right Nephrolithiasis  -CPT: 50080  Surgery: Right Percutaneous Nephrolithotomy   Stop Anticoagulations: Yes  Cardiac/Medical/Pulmonary Clearance needed: yes, Medical per Anesthesia  Clearance needed from Dr: Payton Mccallum with [facility]  Clearance request sent on: Date: 03/28/24   *Orders entered into EPIC  Date: 03/28/24   *Case booked in EPIC  Date: 03/28/24  *Notified pt of Surgery: Date: 03/28/24  PRE-OP UA & CX: yes, will obtain in clinic on 04/19/2024  *Placed into Prior Authorization Work [name] Date: 03/28/24  Assistant/laser/rep:No

## 2024-03-28 NOTE — Progress Notes (Signed)
  Phone Number: [phone] for Surgical Coordinator Fax Number: [phone]  REQUEST FOR SURGICAL CLEARANCE       Date: Date: 03/28/24  Faxed to: Dr. Payton Mccallum  Surgeon: Dr. Legrand Rams, MD     Date of Surgery: 05/03/2024  Operation: Right Percutaneous Nephrolithotomy   Anesthesia Type: General   Diagnosis: Right Nephrolithiasis  Patient Requires:   Medical Clearance : Yes  Reason: Due to Recent Pneumonia diagnosis  Risk Assessment:    Low   []       Moderate   []     High   []           This patient is optimized for surgery  YES []       NO   []    I recommend further assessment/workup prior to surgery. YES []      NO  []   Appointment scheduled for: _______________________   Further recommendations: ____________________________________     Physician Signature:__________________________________   Printed Name: ________________________________________   Date: _________________

## 2024-03-29 ENCOUNTER — Ambulatory Visit
Admission: RE | Admit: 2024-03-29 | Discharge: 2024-03-29 | Disposition: A | Discharge status: Home / Self Care | Source: Ambulatory Visit | Attending: Family Medicine | Admitting: Family Medicine

## 2024-03-29 ENCOUNTER — Ambulatory Visit
Admission: RE | Admit: 2024-03-29 | Discharge: 2024-03-29 | Disposition: A | Discharge status: Home / Self Care | Attending: Family Medicine | Admitting: Family Medicine

## 2024-03-29 DIAGNOSIS — J189 Pneumonia, unspecified organism: Secondary | ICD-10-CM | POA: Insufficient documentation

## 2024-03-29 DIAGNOSIS — E559 Vitamin D deficiency, unspecified: Secondary | ICD-10-CM | POA: Diagnosis not present

## 2024-03-29 DIAGNOSIS — Z09 Encounter for follow-up examination after completed treatment for conditions other than malignant neoplasm: Secondary | ICD-10-CM

## 2024-03-29 DIAGNOSIS — N200 Calculus of kidney: Secondary | ICD-10-CM | POA: Diagnosis not present

## 2024-03-29 DIAGNOSIS — D509 Iron deficiency anemia, unspecified: Secondary | ICD-10-CM | POA: Diagnosis not present

## 2024-03-29 DIAGNOSIS — E1169 Type 2 diabetes mellitus with other specified complication: Secondary | ICD-10-CM | POA: Diagnosis not present

## 2024-03-30 LAB — BASIC METABOLIC PANEL WITH GFR
BUN/Creatinine Ratio: 21 (ref 12–28)
BUN: 20 mg/dL (ref 8–27)
CO2: 21 mmol/L (ref 20–29)
Calcium: 9.6 mg/dL (ref 8.7–10.3)
Chloride: 104 mmol/L (ref 96–106)
Creatinine, Ser: 0.96 mg/dL (ref 0.57–1.00)
Glucose: 109 mg/dL — ABNORMAL HIGH (ref 70–99)
Potassium: 3.7 mmol/L (ref 3.5–5.2)
Sodium: 141 mmol/L (ref 134–144)
eGFR: 62 mL/min/{1.73_m2} (ref >59)

## 2024-03-30 LAB — CBC WITH DIFFERENTIAL/PLATELET
Basophils Absolute: 0 10*3/uL (ref 0.0–0.2)
Basos: 0 %
EOS (ABSOLUTE): 0.1 10*3/uL (ref 0.0–0.4)
Eos: 1 %
Hematocrit: 32.3 % — ABNORMAL LOW (ref 34.0–46.6)
Hemoglobin: 10.1 g/dL — ABNORMAL LOW (ref 11.1–15.9)
Immature Grans (Abs): 0.1 10*3/uL (ref 0.0–0.1)
Immature Granulocytes: 1 %
Lymphocytes Absolute: 2.8 10*3/uL (ref 0.7–3.1)
Lymphs: 35 %
MCH: 24 pg — ABNORMAL LOW (ref 26.6–33.0)
MCHC: 31.3 g/dL — ABNORMAL LOW (ref 31.5–35.7)
MCV: 77 fL — ABNORMAL LOW (ref 79–97)
Monocytes Absolute: 0.8 10*3/uL (ref 0.1–0.9)
Monocytes: 10 %
Neutrophils Absolute: 4.4 10*3/uL (ref 1.4–7.0)
Neutrophils: 53 %
Platelets: 449 10*3/uL (ref 150–450)
RBC: 4.2 x10E6/uL (ref 3.77–5.28)
RDW: 15.5 % — ABNORMAL HIGH (ref 11.7–15.4)
WBC: 8.2 10*3/uL (ref 3.4–10.8)

## 2024-03-30 LAB — LIPID PANEL
Chol/HDL Ratio: 3.1 ratio (ref 0.0–4.4)
Cholesterol, Total: 145 mg/dL (ref 100–199)
HDL: 47 mg/dL (ref >39)
LDL Chol Calc (NIH): 72 mg/dL (ref 0–99)
Triglycerides: 150 mg/dL — ABNORMAL HIGH (ref 0–149)
VLDL Cholesterol Cal: 26 mg/dL (ref 5–40)

## 2024-03-30 LAB — HEMOGLOBIN A1C
Est. average glucose Bld gHb Est-mCnc: 183 mg/dL
Hgb A1c MFr Bld: 8 % — ABNORMAL HIGH (ref 4.8–5.6)

## 2024-03-30 LAB — MICROALBUMIN / CREATININE URINE RATIO
Creatinine, Urine: 83.8 mg/dL
Microalb/Creat Ratio: 97 mg/g{creat} — ABNORMAL HIGH (ref 0–29)
Microalbumin, Urine: 81.5 ug/mL

## 2024-03-30 LAB — VITAMIN D 25 HYDROXY (VIT D DEFICIENCY, FRACTURES): Vit D, 25-Hydroxy: 34.5 ng/mL (ref 30.0–100.0)

## 2024-03-30 LAB — VITAMIN B12: Vitamin B-12: 799 pg/mL (ref 232–1245)

## 2024-04-08 ENCOUNTER — Encounter: Payer: Self-pay | Admitting: Family Medicine

## 2024-04-16 ENCOUNTER — Ambulatory Visit (INDEPENDENT_AMBULATORY_CARE_PROVIDER_SITE_OTHER): Admitting: Family Medicine

## 2024-04-16 ENCOUNTER — Ambulatory Visit: Admitting: Family Medicine

## 2024-04-16 ENCOUNTER — Encounter: Payer: Self-pay | Admitting: Family Medicine

## 2024-04-16 VITALS — BP 102/53 | HR 81 | Resp 16 | Ht 60.0 in | Wt 119.9 lb

## 2024-04-16 DIAGNOSIS — Z7984 Long term (current) use of oral hypoglycemic drugs: Secondary | ICD-10-CM

## 2024-04-16 DIAGNOSIS — J189 Pneumonia, unspecified organism: Secondary | ICD-10-CM | POA: Diagnosis not present

## 2024-04-16 DIAGNOSIS — E1122 Type 2 diabetes mellitus with diabetic chronic kidney disease: Secondary | ICD-10-CM | POA: Diagnosis not present

## 2024-04-16 DIAGNOSIS — I5030 Unspecified diastolic (congestive) heart failure: Secondary | ICD-10-CM

## 2024-04-16 DIAGNOSIS — N200 Calculus of kidney: Secondary | ICD-10-CM | POA: Diagnosis not present

## 2024-04-16 DIAGNOSIS — N182 Chronic kidney disease, stage 2 (mild): Secondary | ICD-10-CM

## 2024-04-16 DIAGNOSIS — I4510 Unspecified right bundle-branch block: Secondary | ICD-10-CM | POA: Diagnosis not present

## 2024-04-16 DIAGNOSIS — Z01818 Encounter for other preprocedural examination: Secondary | ICD-10-CM | POA: Diagnosis not present

## 2024-04-16 NOTE — Progress Notes (Signed)
 Established patient visit   Patient: Holly Matthews   DOB: 13-May-1951   73 y.o. Female  MRN: [id number] Visit Date: 04/16/2024  Today's healthcare provider: Carlean Charter, DO   Chief Complaint  Patient presents with   Follow-up    F/u on pre-op discussion . No other concerns   Subjective    HPI Holly Matthews is a 73 year old female who presents for pre-operative evaluation and follow-up after pneumonia.  She experiences persistent fatigue and weakness, particularly in her legs, which she attributes to prolonged inactivity during her illness. She has returned to work but still experiences some difficulty with leg strength. Occasional lightheadedness occurs when moving or lying down at night.  She mentions occasional coughing, especially in the mornings, but notes significant improvement in her ability to speak without coughing during the day.  She was hospitalized for pneumonia 03/24/24-03/25/24. She is not currently using her albuterol  inhaler, which was prescribed for pneumonia, and reports difficulty using it correctly.  She experiences occasional pain during urination, described as 'twinges' similar to past kidney stone episodes, occurring once or twice a day. She is scheduled for a urine test on Thursday to check for any issues before her upcoming surgery. She has a history of kidney stones and is concerned about the potential contribution of calcium  supplements to stone formation.  She reports a slight weight loss, which she attributes to decreased appetite and activity during her illness. She is concerned about her blood counts being low and requests a check of her hemoglobin and hematocrit levels, as they were low during her hospitalization.  She has started on vitamin D  supplements and occasionally takes calcium  with vitamin D . 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      Medications: Outpatient Medications Prior to Visit  Medication Sig    Accu-Chek Softclix Lancets lancets USE UP TO FOUR TIMES DAILY AS DIRECTED   Benzocaine -Menthol  (CEPACOL EXTRA STRENGTH) 15-2.6 MG LOZG Use as directed 1 lozenge in the mouth or throat 3 times/day as needed-between meals & bedtime (cough, sore throat).   blood glucose meter kit and supplies Dispense based on patient and insurance preference. Use up to four times daily as directed. (FOR ICD-10 E10.9, E11.9).   calcium -vitamin D  (OSCAL WITH D) 500-200 MG-UNIT TABS tablet Take 1 tablet by mouth in the morning and at bedtime.   glucose blood (ACCU-CHEK GUIDE) test strip USE UP TO FOUR TIMES DAILY AS INSTRUCTED   menthol -cetylpyridinium (CEPACOL) 3 MG lozenge Take 1 lozenge (3 mg total) by mouth as needed (cough, sore throat).   metFORMIN  (GLUCOPHAGE -XR) 750 MG 24 hr tablet TAKE 1 TABLET BY MOUTH DAILY  WITH BREAKFAST   pantoprazole  (PROTONIX ) 40 MG tablet Take 1 tablet (40 mg total) by mouth 2 (two) times daily.   rosuvastatin  (CRESTOR ) 5 MG tablet TAKE 1 TABLET BY MOUTH DAILY   [DISCONTINUED] albuterol  (VENTOLIN  HFA) 108 (90 Base) MCG/ACT inhaler Inhale 1-2 puffs into the lungs every 4 (four) hours as needed for wheezing or shortness of breath.   [DISCONTINUED] HYDROcodone  bit-homatropine (HYCODAN) 5-1.5 MG/5ML syrup Take 5 mLs by mouth every 8 (eight) hours as needed for cough.   No facility-administered medications prior to visit.    Review of Systems  Constitutional:  Positive for fatigue (improving). Negative for chills and fever.  HENT:  Negative for congestion, ear pain, rhinorrhea, sneezing and sore throat.   Eyes: Negative.  Negative for pain and redness.  Respiratory:  Positive for cough (intermittently, especially in the morning). Negative for shortness of breath and wheezing.   Cardiovascular:  Negative for chest pain and leg swelling.  Gastrointestinal:  Negative for abdominal pain, blood in stool, constipation, diarrhea, nausea and vomiting.  Endocrine: Negative for polydipsia,  polyphagia and polyuria.  Genitourinary:  Positive for dysuria (intermittently; inconsistent) and flank pain (twinges of pain on right side; infrequent). Negative for hematuria, pelvic pain, vaginal bleeding and vaginal discharge.  Musculoskeletal:  Negative for arthralgias, back pain, gait problem and joint swelling.  Skin:  Negative for rash.  Neurological:  Positive for light-headedness (small amount, occassionally when she moves a particular way or lays down). Negative for dizziness, tremors, seizures, weakness, numbness and headaches.  Hematological:  Negative for adenopathy.  Psychiatric/Behavioral: Negative.  Negative for behavioral problems, confusion and dysphoric mood. The patient is not nervous/anxious and is not hyperactive.         Objective    BP (!) 102/53 (BP Location: Left Arm, Patient Position: Sitting, Cuff Size: Normal)   Pulse 81   Resp 16   Ht 5' (1.524 m)   Wt 119 lb 14.4 oz (54.4 kg)   SpO2 100%   BMI 23.42 kg/m     Physical Exam Constitutional:      Appearance: Normal appearance.  HENT:     Head: Normocephalic and atraumatic.  Eyes:     General: No scleral icterus.    Extraocular Movements: Extraocular movements intact.     Conjunctiva/sclera: Conjunctivae normal.  Cardiovascular:     Rate and Rhythm: Normal rate and regular rhythm.     Pulses: Normal pulses.     Heart sounds: Normal heart sounds. No murmur heard.    No friction rub. No gallop.  Pulmonary:     Effort: Pulmonary effort is normal. No respiratory distress.     Breath sounds: Normal breath sounds.  Abdominal:     General: Bowel sounds are normal. There is no distension.     Palpations: Abdomen is soft. There is no mass.     Tenderness: There is no abdominal tenderness. There is no guarding.  Musculoskeletal:     Right lower leg: No edema.     Left lower leg: No edema.  Skin:    General: Skin is warm and dry.  Neurological:     Mental Status: She is alert and oriented to person,  place, and time. Mental status is at baseline.  Psychiatric:        Mood and Affect: Mood normal.        Behavior: Behavior normal.      No results found for any visits on 04/16/24.  Assessment & Plan    Pre-op examination -     DG Chest 2 View; Future -     CBC -     Renal function panel  Community acquired pneumonia, unspecified laterality -     DG Chest 2 View; Future -     CBC -     Renal function panel  Staghorn renal calculus -     DG Chest 2 View; Future -     CBC -     Renal function panel  Heart failure with preserved ejection fraction, NYHA class I (HCC) Assessment & Plan: No acute concerns.  Continue to monitor.   Right bundle branch block (RBBB) on electrocardiogram (ECG)  Type 2 diabetes mellitus with stage 2 chronic kidney disease, without long-term current use of insulin  (HCC)  Preoperative examination; staghorn renal calculus Physical exam overall unremarkable.  Renal function panel and CBC ordered for recheck prior to upcoming percutaneous nephrolithotomy.  Repeat chest x-ray also; patient will be waiting until at least her appointment with urology on Thursday to obtain this, as it is unclear if they want this done closer to her procedure - Revised cardiac risk index score 0, resulting in estimated 3.9% risk of major cardiac event. - As of 05/03/2024, patient's ARISCAT Score will be 18 points, which is low risk, conferring an approximately 1.6% risk of in-hospital postop pulmonary complications.  Recent hospitalization for pneumonia Resolving.  Recent hospitalization for pneumonia (03/24/2024-03/25/2024) with residual morning cough, lungs clear, no need for albuterol .  Counseled patient on how to use albuterol  inhaler correctly, including obtaining an AeroChamber/spacer to facilitate use. - Order chest x-ray for pre-operative evaluation.  Heart failure with preserved ejection fraction, NYHA class 1 Chronic, stable.  Noted on 2019 echocardiogram, which  showed ejection fraction >55% with grade 1 diastolic dysfunction.  It was performed to evaluate patient's symptoms of intermittent chest pain and palpitations at the time.  She has experienced no known exacerbations.  Right bundle branch block Chronic, stable.  Right bundle branch block exhibited on all ECGs through 2019.  No acute concerns.  Continue to monitor.  Type 2 diabetes mellitus with chronic kidney disease stage G2A2, without long-term use of insulin  03/29/2024 elevated A1c at 8.0, up from 7.5 on 09/19/2023, with recent increase likely related to recent illness; patient aware and attempting better management.    Return in about 3 months (around 07/16/2024) for CPE, plus mAWV with AWV nurse.      I discussed the assessment and treatment plan with the patient  The patient was provided an opportunity to ask questions and all were answered. The patient agreed with the plan and demonstrated an understanding of the instructions.   The patient was advised to call back or seek an in-person evaluation if the symptoms worsen or if the condition fails to improve as anticipated.    Carlean Charter, DO  [facility] [phone] (phone) [phone] (fax)  [facility]

## 2024-04-16 NOTE — Assessment & Plan Note (Signed)
No acute concerns.  Continue to monitor.

## 2024-04-17 LAB — RENAL FUNCTION PANEL
Albumin: 4 g/dL (ref 3.8–4.8)
BUN/Creatinine Ratio: 17 (ref 12–28)
BUN: 17 mg/dL (ref 8–27)
CO2: 19 mmol/L — ABNORMAL LOW (ref 20–29)
Calcium: 9.9 mg/dL (ref 8.7–10.3)
Chloride: 107 mmol/L — ABNORMAL HIGH (ref 96–106)
Creatinine, Ser: 1.03 mg/dL — ABNORMAL HIGH (ref 0.57–1.00)
Glucose: 147 mg/dL — ABNORMAL HIGH (ref 70–99)
Phosphorus: 3 mg/dL (ref 3.0–4.3)
Potassium: 4.2 mmol/L (ref 3.5–5.2)
Sodium: 142 mmol/L (ref 134–144)
eGFR: 57 mL/min/{1.73_m2} — ABNORMAL LOW (ref >59)

## 2024-04-17 LAB — CBC
Hematocrit: 31.7 % — ABNORMAL LOW (ref 34.0–46.6)
Hemoglobin: 9.6 g/dL — ABNORMAL LOW (ref 11.1–15.9)
MCH: 24.1 pg — ABNORMAL LOW (ref 26.6–33.0)
MCHC: 30.3 g/dL — ABNORMAL LOW (ref 31.5–35.7)
MCV: 79 fL (ref 79–97)
Platelets: 271 10*3/uL (ref 150–450)
RBC: 3.99 x10E6/uL (ref 3.77–5.28)
RDW: 16.8 % — ABNORMAL HIGH (ref 11.7–15.4)
WBC: 4.8 10*3/uL (ref 3.4–10.8)

## 2024-04-19 ENCOUNTER — Telehealth: Payer: Self-pay | Admitting: Family Medicine

## 2024-04-19 ENCOUNTER — Telehealth: Payer: Self-pay | Admitting: Urology

## 2024-04-19 ENCOUNTER — Other Ambulatory Visit

## 2024-04-19 DIAGNOSIS — N200 Calculus of kidney: Secondary | ICD-10-CM | POA: Diagnosis not present

## 2024-04-19 DIAGNOSIS — E1169 Type 2 diabetes mellitus with other specified complication: Secondary | ICD-10-CM

## 2024-04-19 DIAGNOSIS — E785 Hyperlipidemia, unspecified: Secondary | ICD-10-CM

## 2024-04-19 MED ORDER — ROSUVASTATIN CALCIUM 5 MG PO TABS: 5.0000 mg | ORAL_TABLET | Freq: Every day | ORAL | 3 refills | Status: DC

## 2024-04-19 NOTE — Telephone Encounter (Signed)
 Pt came in office and wanted to know if and when will she need her chest x-ray? Pt has surgery on 05/03/24 and see's Dr. Estanislao Heimlich 05/17/24 but pt wanted to verify if she needs to get an chest x-ray?

## 2024-04-19 NOTE — Telephone Encounter (Signed)
 [facility] is requesting refill rosuvastatin  (CRESTOR ) 5 MG tablet   Please advise

## 2024-04-19 NOTE — Telephone Encounter (Signed)
 Please advise on when x-ray needs to be completed in relation to surgery/pre -op.

## 2024-04-19 NOTE — Addendum Note (Signed)
 Addended by: Davante Gerke E on: 04/19/2024 04:15 PM   Modules accepted: Orders

## 2024-04-20 LAB — URINALYSIS, COMPLETE
Bilirubin, UA: NEGATIVE
Glucose, UA: NEGATIVE
Ketones, UA: NEGATIVE
Nitrite, UA: NEGATIVE
Specific Gravity, UA: 1.025 (ref 1.005–1.030)
Urobilinogen, Ur: 0.2 mg/dL (ref 0.2–1.0)
pH, UA: 6 (ref 5.0–7.5)

## 2024-04-20 LAB — MICROSCOPIC EXAMINATION: WBC, UA: >30 /HPF — AB (ref 0–5)

## 2024-04-20 NOTE — Telephone Encounter (Signed)
 Spoke with pt. Pt. Advised to have chest x-ray done next week- approx 1 week prior to surgery. Ordered by PCP office. Patient requested that Dr. Estanislao Heimlich take a look at her labs she had done at PCP as well just so he knows her levels. Advised I would let Dr. Estanislao Heimlich know.

## 2024-04-23 ENCOUNTER — Encounter: Payer: Self-pay | Admitting: Family Medicine

## 2024-04-24 LAB — CULTURE, URINE COMPREHENSIVE

## 2024-04-25 ENCOUNTER — Ambulatory Visit
Admission: RE | Admit: 2024-04-25 | Discharge: 2024-04-25 | Disposition: A | Discharge status: Home / Self Care | Source: Ambulatory Visit | Attending: Family Medicine | Admitting: Family Medicine

## 2024-04-25 ENCOUNTER — Ambulatory Visit
Admission: RE | Admit: 2024-04-25 | Discharge: 2024-04-25 | Disposition: A | Discharge status: Home / Self Care | Attending: Family Medicine | Admitting: Family Medicine

## 2024-04-25 ENCOUNTER — Encounter
Admission: RE | Admit: 2024-04-25 | Discharge: 2024-04-25 | Disposition: A | Discharge status: Home / Self Care | Source: Ambulatory Visit | Attending: Urology | Admitting: Urology

## 2024-04-25 ENCOUNTER — Telehealth: Payer: Self-pay

## 2024-04-25 ENCOUNTER — Other Ambulatory Visit: Payer: Self-pay

## 2024-04-25 DIAGNOSIS — J189 Pneumonia, unspecified organism: Secondary | ICD-10-CM

## 2024-04-25 DIAGNOSIS — Z01818 Encounter for other preprocedural examination: Secondary | ICD-10-CM

## 2024-04-25 DIAGNOSIS — R918 Other nonspecific abnormal finding of lung field: Secondary | ICD-10-CM | POA: Diagnosis not present

## 2024-04-25 DIAGNOSIS — N200 Calculus of kidney: Secondary | ICD-10-CM | POA: Diagnosis not present

## 2024-04-25 DIAGNOSIS — Z01812 Encounter for preprocedural laboratory examination: Secondary | ICD-10-CM | POA: Insufficient documentation

## 2024-04-25 HISTORY — DX: Type 2 diabetes mellitus without complications: E11.9

## 2024-04-25 HISTORY — DX: Heart failure, unspecified: I50.9

## 2024-04-25 HISTORY — DX: Personal history of other diseases of the digestive system: Z87.19

## 2024-04-25 HISTORY — DX: Chronic kidney disease, stage 2 (mild): N18.2

## 2024-04-25 HISTORY — DX: Vitamin D deficiency, unspecified: E55.9

## 2024-04-25 HISTORY — DX: Unspecified right bundle-branch block: I45.10

## 2024-04-25 HISTORY — DX: Unspecified hydronephrosis: N13.30

## 2024-04-25 HISTORY — DX: Calculus of kidney: N20.0

## 2024-04-25 HISTORY — DX: Iron deficiency anemia, unspecified: D50.9

## 2024-04-25 MED ORDER — SULFAMETHOXAZOLE-TRIMETHOPRIM 800-160 MG PO TABS: 1.0000 | ORAL_TABLET | Freq: Two times a day (BID) | ORAL | 0 refills | Status: DC

## 2024-04-25 NOTE — Patient Instructions (Signed)
 Your procedure is scheduled on:05-03-24 Thursday (Follow Instructions given to you from Dr [facility] office about arriving at Interventional Radiology)  To find out your arrival time, please call [phone] between 1PM - 3PM on:05-02-24 Wednesday If your arrival time is 6:00 am, do not arrive before that time as the [facility] entrance doors do not open until 6:00 am.  REMEMBER: Instructions that are not followed completely may result in serious medical risk, up to and including death; or upon the discretion of your surgeon and anesthesiologist your surgery may need to be rescheduled.  Do not eat food OR drink any liquids after midnight the night before surgery.  No gum chewing or hard candies.  One week prior to surgery:Stop NOW (04-25-24) Stop Anti-inflammatories (NSAIDS) such as Advil , Aleve, Ibuprofen , Motrin , Naproxen, Naprosyn and Aspirin based products such as Excedrin, Goody's Powder, BC Powder. Stop ANY OVER THE COUNTER supplements until after surgery (Calcium  + D, Vitamin D3)  You may however, continue to take Tylenol  if needed for pain up until the day of surgery.  Stop metFORMIN  (GLUCOPHAGE -XR) 2 days prior to surgery-Last dose will be on 04-30-24 Monday  Continue taking all of your other prescription medications up until the day of surgery.  ON THE DAY OF SURGERY ONLY TAKE THESE MEDICATIONS WITH SIPS OF WATER: -pantoprazole  (PROTONIX )  -rosuvastatin  (CRESTOR )   No Alcohol for 24 hours before or after surgery.  No Smoking including e-cigarettes for 24 hours before surgery.  No chewable tobacco products for at least 6 hours before surgery.  No nicotine patches on the day of surgery.  Do not use any "recreational" drugs for at least a week (preferably 2 weeks) before your surgery.  Please be advised that the combination of cocaine and anesthesia may have negative outcomes, up to and including death. If you test positive for cocaine, your surgery will be cancelled.  On  the morning of surgery brush your teeth with toothpaste and water, you may rinse your mouth with mouthwash if you wish. Do not swallow any toothpaste or mouthwash.  Use CHG Soap as directed on instruction sheet.  Do not wear jewelry, make-up, hairpins, clips or nail polish.  For welded (permanent) jewelry: bracelets, anklets, waist bands, etc.  Please have this removed prior to surgery.  If it is not removed, there is a chance that hospital personnel will need to cut it off on the day of surgery.  Do not wear lotions, powders, or perfumes.   Do not shave body hair from the neck down 48 hours before surgery.  Contact lenses, hearing aids and dentures may not be worn into surgery.  Do not bring valuables to the hospital. [facility] is not responsible for any missing/lost belongings or valuables.   Notify your doctor if there is any change in your medical condition (cold, fever, infection).  Wear comfortable clothing (specific to your surgery type) to the hospital.  After surgery, you can help prevent lung complications by doing breathing exercises.  Take deep breaths and cough every 1-2 hours. Your doctor may order a device called an Incentive Spirometer to help you take deep breaths. When coughing or sneezing, hold a pillow firmly against your incision with both hands. This is called "splinting." Doing this helps protect your incision. It also decreases belly discomfort.  If you are being admitted to the hospital overnight, leave your suitcase in the car. After surgery it may be brought to your room.  In case of increased patient census, it may be  necessary for you, the patient, to continue your postoperative care in the Same Day Surgery department.  If you are being discharged the day of surgery, you will not be allowed to drive home. You will need a responsible individual to drive you home and stay with you for 24 hours after surgery.   If you are taking public transportation, you  will need to have a responsible individual with you.  Please call the Pre-admissions Testing Dept. at [phone] if you have any questions about these instructions.  Surgery Visitation Policy:  Patients having surgery or a procedure may have two visitors.  Children under the age of 54 must have an adult with them who is not the patient.  Inpatient Visitation:    Visiting hours are 7 a.m. to 8 p.m. Up to four visitors are allowed at one time in a patient room. The visitors may rotate out with other people during the day.  One visitor age 63 or older may stay with the patient overnight and must be in the room by 8 p.m.     Preparing for Surgery with CHLORHEXIDINE GLUCONATE (CHG) Soap  Chlorhexidine Gluconate (CHG) Soap  o An antiseptic cleaner that kills germs and bonds with the skin to continue killing germs even after washing  o Used for showering the night before surgery and morning of surgery  Before surgery, you can play an important role by reducing the number of germs on your skin.  CHG (Chlorhexidine gluconate) soap is an antiseptic cleanser which kills germs and bonds with the skin to continue killing germs even after washing.  Please do not use if you have an allergy to CHG or antibacterial soaps. If your skin becomes reddened/irritated stop using the CHG.  1. Shower the NIGHT BEFORE SURGERY and the MORNING OF SURGERY with CHG soap.  2. If you choose to wash your hair, wash your hair first as usual with your normal shampoo.  3. After shampooing, rinse your hair and body thoroughly to remove the shampoo.  4. Use CHG as you would any other liquid soap. You can apply CHG directly to the skin and wash gently with a scrungie or a clean washcloth.  5. Apply the CHG soap to your body only from the neck down. Do not use on open wounds or open sores. Avoid contact with your eyes, ears, mouth, and genitals (private parts). Wash face and genitals (private parts) with your  normal soap.  6. Wash thoroughly, paying special attention to the area where your surgery will be performed.  7. Thoroughly rinse your body with warm water.  8. Do not shower/wash with your normal soap after using and rinsing off the CHG soap.  9. Pat yourself dry with a clean towel.  10. Wear clean pajamas to bed the night before surgery.  12. Place clean sheets on your bed the night of your first shower and do not sleep with pets.  13. Shower again with the CHG soap on the day of surgery prior to arriving at the hospital.  14. Do not apply any deodorants/lotions/powders.  15. Please wear clean clothes to the hospital.

## 2024-04-25 NOTE — Telephone Encounter (Signed)
-----   Message from Lawerence Pressman sent at 04/25/2024  8:15 AM EDT ----- Please start Bactrim DS twice daily x 7 days to sterilize urine prior to upcoming procedure  Thanks Jay Meth, MD 04/25/2024

## 2024-04-25 NOTE — Telephone Encounter (Signed)
 Patient notified of results and verbalized understanding. Medication sent to [facility] in [place] per patient request.

## 2024-05-02 ENCOUNTER — Other Ambulatory Visit: Payer: Self-pay | Admitting: Radiology

## 2024-05-02 NOTE — H&P (Signed)
 Chief Complaint: Right staghorn calculus, nephrolithiasis with hydronephrosis - IR consulted for right percutaneous nephroureteral catheter placement with OR to follow with Dr. Estanislao Heimlich  Referring Provider(s): Estanislao Heimlich Dennard Fisher, MD  Supervising Physician: Myrlene Asper  Patient Status: [facility] - Out-pt  History of Present Illness: Holly Matthews is a 73 y.o. female with pmhx of HLD, DM2, osteoporosis, kidney stones. Pt was seen initially by urology on 03/15/24 after an abdominal US  showed large right sided renal stone. Follow up CT showed complete right staghorn calculus measuring 3cm x 5cm - extending to the UPJ with hydronephrosis. Urology had discussed treatment options with the patient, with their decision being to proceed with percutaneous nephrolithotomy. Urology requesting right nephroureteral catheter placement with OR to follow for stone removal with Dr. Estanislao Heimlich with urology.  CT renal stone study from 03/23/24: IMPRESSION: 1. New right staghorn calculus with mild right hydronephrosis.  The 2. Punctate nonobstructing left renal calculi. 3. Moderate size hiatal hernia. 4. Bilateral multifocal lower lobe airspace disease compatible with multiple focal pneumonia. Recommend follow-up PA and lateral chest x-ray to confirm resolution in 4-6 weeks.  *** Patient is Full Code  Past Medical History:  Diagnosis Date   Acute respiratory failure with hypoxia (HCC) 03/23/2024   Adenomatous polyp 05/06/2015   Had repeat colonoscopy 2014.    Repeat in 5 years.   Allergy    CHF (congestive heart failure) (HCC)    CKD (chronic kidney disease), stage II    DM (diabetes mellitus), type 2 (HCC)    GERD (gastroesophageal reflux disease)    History of colon polyps 05/06/2015   History of hiatal hernia    Hydronephrosis of right kidney    Hyperlipidemia    Microcytic anemia    Pneumonia 03/23/2024   RBBB (right bundle branch block)    Sepsis (HCC) 03/23/2024   Serum cholesterol elevated     Staghorn renal calculus    Vitamin D  deficiency     Past Surgical History:  Procedure Laterality Date   ABDOMINAL HYSTERECTOMY  1980   APPENDECTOMY  1967   COLONOSCOPY     COLONOSCOPY WITH PROPOFOL  N/A 11/06/2018   Procedure: COLONOSCOPY WITH PROPOFOL ;  Surgeon: Deveron Fly, MD;  Location: [facility] ENDOSCOPY;  Service: Endoscopy;  Laterality: N/A;   ESOPHAGOGASTRODUODENOSCOPY     ESOPHAGOGASTRODUODENOSCOPY (EGD) WITH PROPOFOL  N/A 11/06/2018   Procedure: ESOPHAGOGASTRODUODENOSCOPY (EGD) WITH PROPOFOL ;  Surgeon: Deveron Fly, MD;  Location: [facility] ENDOSCOPY;  Service: Endoscopy;  Laterality: N/A;    Allergies: Eucalyptus oil, Codeine, Doxycycline , and Tylenol  [acetaminophen ]  Medications: Prior to Admission medications   Medication Sig Start Date End Date Taking? Authorizing Provider  Accu-Chek Softclix Lancets lancets USE UP TO FOUR TIMES DAILY AS DIRECTED 09/14/23   Normie Becton, FNP  blood glucose meter kit and supplies Dispense based on patient and insurance preference. Use up to four times daily as directed. (FOR ICD-10 E10.9, E11.9). 03/12/22   Normie Becton, FNP  calcium -vitamin D  (OSCAL WITH D) 500-200 MG-UNIT TABS tablet Take 1 tablet by mouth in the morning and at bedtime.    [provider]  Cholecalciferol  (VITAMIN D3) 125 MCG (5000 UT) CAPS Take 5,000 Units by mouth daily.    [provider]  glucose blood (ACCU-CHEK GUIDE) test strip USE UP TO FOUR TIMES DAILY AS INSTRUCTED 09/14/23   Iona Manis T, FNP  metFORMIN  (GLUCOPHAGE -XR) 750 MG 24 hr tablet TAKE 1 TABLET BY MOUTH DAILY  WITH BREAKFAST 03/12/24   Carlean Charter,  DO  pantoprazole  (PROTONIX ) 40 MG tablet Take 1 tablet (40 mg total) by mouth 2 (two) times daily. 02/03/24   Carlean Charter, DO  rosuvastatin  (CRESTOR ) 5 MG tablet Take 1 tablet (5 mg total) by mouth daily. Patient taking differently: Take 5 mg by mouth every morning. 04/19/24   Carlean Charter, DO  sulfamethoxazole -trimethoprim   (BACTRIM  DS) 800-160 MG tablet Take 1 tablet by mouth every 12 (twelve) hours. 04/25/24   Lawerence Pressman, MD     Family History  Problem Relation Age of Onset   Hypertension Father    CVA Father    Healthy Sister    Healthy Sister    Diabetes Brother    Healthy Brother    Diabetes Son    Colon cancer Maternal Aunt    Breast cancer Neg Hx    Ovarian cancer Neg Hx     Social History   Socioeconomic History   Marital status: Widowed    Spouse name: Not on file   Number of children: 2   Years of education: College   Highest education level: Associate degree: occupational, Scientist, product/process development, or vocational program  Occupational History    Comment: Part-Time    Employer: Horticulturist, commercial  Tobacco Use   Smoking status: Never   Smokeless tobacco: Never  Vaping Use   Vaping status: Never Used  Substance and Sexual Activity   Alcohol use: No   Drug use: No   Sexual activity: Not Currently  Other Topics Concern   Not on file  Social History Narrative   Not on file   Social Drivers of Health   Financial Resource Strain: Low Risk  (03/25/2024)   Overall Financial Resource Strain (CARDIA)    Difficulty of Paying Living Expenses: Not hard at all  Food Insecurity: No Food Insecurity (03/25/2024)   Hunger Vital Sign    Worried About Running Out of Food in the Last Year: Never true    Ran Out of Food in the Last Year: Never true  Transportation Needs: No Transportation Needs (03/25/2024)   PRAPARE - Administrator, Civil Service (Medical): No    Lack of Transportation (Non-Medical): No  Physical Activity: Insufficiently Active (03/25/2024)   Exercise Vital Sign    Days of Exercise per Week: 1 day    Minutes of Exercise per Session: 10 min  Stress: No Stress Concern Present (03/25/2024)   [employer] of Occupational Health - Occupational Stress Questionnaire    Feeling of Stress : Only a little  Social Connections: Moderately Integrated (03/25/2024)   Social Connection and  Isolation Panel [NHANES]    Frequency of Communication with Friends and Family: Twice a week    Frequency of Social Gatherings with Friends and Family: Once a week    Attends Religious Services: More than 4 times per year    Active Member of [employer] or Organizations: Yes    Attends Banker Meetings: More than 4 times per year    Marital Status: Widowed     Review of Systems: A 12 point ROS discussed and pertinent positives are indicated in the HPI above.  All other systems are negative.  Review of Systems  Vital Signs: There were no vitals taken for this visit.  Advance Care Plan: The advanced care place/surrogate decision maker was discussed at the time of visit and the patient did not wish to discuss or was not able to name a surrogate decision maker or provide an advance care plan.  Physical Exam  Imaging: DG Chest 2 View Result Date: 04/27/2024 CLINICAL DATA:  Preop recheck. History of hospitalization for pneumonia. EXAM: CHEST - 2 VIEW COMPARISON:  None Available. FINDINGS: 03/29/2024 and older studies. Cardiac silhouette is normal in size and configuration. No mediastinal or hilar masses. No evidence of adenopathy. Lungs are hyperexpanded. Linear opacities are noted at the left lung base consistent with subsegmental atelectasis/scarring, without change from the most recent prior study. Remainder of the lungs is clear. No pleural effusion or pneumothorax. Skeletal structures are intact. IMPRESSION: 1. No active cardiopulmonary disease. 2. Residual left lung base opacity is consistent with atelectasis and/or scarring, stable from the most recent prior exam. No evidence of pneumonia. Electronically Signed   By: Amanda Jungling M.D.   On: 04/27/2024 11:10    Labs:  CBC: Recent Labs    03/23/24 1514 03/24/24 0444 03/29/24 0839 04/16/24 1625  WBC 7.6 7.7 8.2 4.8  HGB 9.9* 8.9* 10.1* 9.6*  HCT 31.0* 28.2* 32.3* 31.7*  PLT 267 247 449 271    COAGS: Recent Labs     03/23/24 1724  INR 1.1  APTT 33    BMP: Recent Labs    03/23/24 1514 03/24/24 0444 03/29/24 0839 04/16/24 1625  NA 137 136 141 142  K 3.7 3.8 3.7 4.2  CL 105 109 104 107*  CO2 23 20* 21 19*  GLUCOSE 161* 220* 109* 147*  BUN 15 12 20 17   CALCIUM  9.2 8.6* 9.6 9.9  CREATININE 1.13* 0.94 0.96 1.03*  GFRNONAA 51* >60  --   --     LIVER FUNCTION TESTS: Recent Labs    03/23/24 1724 04/16/24 1625  BILITOT 0.8  --   AST 14*  --   ALT 11  --   ALKPHOS 47  --   PROT 7.7  --   ALBUMIN 3.6 4.0    TUMOR MARKERS: No results for input(s): "AFPTM", "CEA", "CA199", "CHROMGRNA" in the last 8760 hours.  Assessment and Plan:  Holly Matthews is a 73 y.o. female with pmhx of HLD, DM2, osteoporosis, kidney stones. Pt was seen initially by urology on 03/15/24 after an abdominal US  showed large right sided renal stone. Follow up CT showed complete right staghorn calculus measuring 3cm x 5cm - extending to the UPJ with hydronephrosis. Urology had discussed treatment options with the patient, with their decision being to proceed with percutaneous nephrolithotomy. Urology requesting nephroureteral catheter placement with OR to follow for stone removal with Dr. Estanislao Heimlich with urology.  CT renal stone study from 03/23/24: IMPRESSION: 1. New right staghorn calculus with mild right hydronephrosis.  The 2. Punctate nonobstructing left renal calculi. 3. Moderate size hiatal hernia. 4. Bilateral multifocal lower lobe airspace disease compatible with multiple focal pneumonia. Recommend follow-up PA and lateral chest x-ray to confirm resolution in 4-6 weeks.   Risks and benefits of right PCN placement was discussed with the patient including, but not limited to, infection, bleeding, significant bleeding causing loss or decrease in renal function or damage to adjacent structures.   All of the patient's questions were answered, patient is agreeable to proceed.  Consent signed and in chart.   Thank  you for allowing our service to participate in Holly Matthews 's care.  Electronically Signed: Nicolasa Barrett, PA-C   05/02/2024, 12:52 PM      I spent a total of {New ZOXW:[id number]} {New Out-Pt:[id number]}  {Established Out-Pt:[id number]} in face to face in clinical consultation, greater than 50% of which was counseling/coordinating care  for right percutaneous nephroureteral catheter placement with nephrolithotomy to follow in OR with urology.

## 2024-05-03 ENCOUNTER — Ambulatory Visit: Admitting: Anesthesiology

## 2024-05-03 ENCOUNTER — Ambulatory Visit

## 2024-05-03 ENCOUNTER — Ambulatory Visit: Payer: Self-pay | Admitting: Urgent Care

## 2024-05-03 ENCOUNTER — Inpatient Hospital Stay
Admission: RE | Admit: 2024-05-03 | Discharge: 2024-05-06 | DRG: 661 | Disposition: A | Discharge status: Home / Self Care | Attending: Urology | Admitting: Urology

## 2024-05-03 ENCOUNTER — Other Ambulatory Visit: Payer: Self-pay

## 2024-05-03 ENCOUNTER — Ambulatory Visit
Admission: RE | Admit: 2024-05-03 | Discharge: 2024-05-03 | Disposition: A | Discharge status: Home / Self Care | Source: Ambulatory Visit | Attending: Urology | Admitting: Urology

## 2024-05-03 ENCOUNTER — Encounter: Payer: Self-pay | Admitting: Urology

## 2024-05-03 ENCOUNTER — Encounter
Admission: RE | Disposition: A | Discharge status: Home / Self Care | Payer: Self-pay | Source: Home / Self Care | Attending: Urology

## 2024-05-03 VITALS — BP 103/52 | HR 72 | Resp 14

## 2024-05-03 DIAGNOSIS — N1330 Unspecified hydronephrosis: Principal | ICD-10-CM

## 2024-05-03 DIAGNOSIS — Z8249 Family history of ischemic heart disease and other diseases of the circulatory system: Secondary | ICD-10-CM

## 2024-05-03 DIAGNOSIS — N132 Hydronephrosis with renal and ureteral calculous obstruction: Secondary | ICD-10-CM | POA: Diagnosis not present

## 2024-05-03 DIAGNOSIS — Z860100 Personal history of colon polyps, unspecified: Secondary | ICD-10-CM | POA: Diagnosis not present

## 2024-05-03 DIAGNOSIS — M810 Age-related osteoporosis without current pathological fracture: Secondary | ICD-10-CM | POA: Diagnosis present

## 2024-05-03 DIAGNOSIS — Z79899 Other long term (current) drug therapy: Secondary | ICD-10-CM | POA: Diagnosis not present

## 2024-05-03 DIAGNOSIS — N182 Chronic kidney disease, stage 2 (mild): Secondary | ICD-10-CM | POA: Diagnosis not present

## 2024-05-03 DIAGNOSIS — Z800 Family history of malignant neoplasm of digestive organs: Secondary | ICD-10-CM

## 2024-05-03 DIAGNOSIS — Z7984 Long term (current) use of oral hypoglycemic drugs: Secondary | ICD-10-CM

## 2024-05-03 DIAGNOSIS — Z833 Family history of diabetes mellitus: Secondary | ICD-10-CM | POA: Diagnosis not present

## 2024-05-03 DIAGNOSIS — N200 Calculus of kidney: Secondary | ICD-10-CM | POA: Diagnosis not present

## 2024-05-03 DIAGNOSIS — K219 Gastro-esophageal reflux disease without esophagitis: Secondary | ICD-10-CM | POA: Diagnosis present

## 2024-05-03 DIAGNOSIS — I4510 Unspecified right bundle-branch block: Secondary | ICD-10-CM | POA: Diagnosis present

## 2024-05-03 DIAGNOSIS — Z885 Allergy status to narcotic agent status: Secondary | ICD-10-CM

## 2024-05-03 DIAGNOSIS — E7800 Pure hypercholesterolemia, unspecified: Secondary | ICD-10-CM | POA: Diagnosis not present

## 2024-05-03 DIAGNOSIS — E559 Vitamin D deficiency, unspecified: Secondary | ICD-10-CM | POA: Diagnosis not present

## 2024-05-03 DIAGNOSIS — Z886 Allergy status to analgesic agent status: Secondary | ICD-10-CM

## 2024-05-03 DIAGNOSIS — J309 Allergic rhinitis, unspecified: Secondary | ICD-10-CM | POA: Diagnosis not present

## 2024-05-03 DIAGNOSIS — Z888 Allergy status to other drugs, medicaments and biological substances status: Secondary | ICD-10-CM | POA: Diagnosis not present

## 2024-05-03 DIAGNOSIS — Z881 Allergy status to other antibiotic agents status: Secondary | ICD-10-CM | POA: Diagnosis not present

## 2024-05-03 DIAGNOSIS — R110 Nausea: Secondary | ICD-10-CM | POA: Diagnosis not present

## 2024-05-03 DIAGNOSIS — E1122 Type 2 diabetes mellitus with diabetic chronic kidney disease: Secondary | ICD-10-CM | POA: Diagnosis not present

## 2024-05-03 DIAGNOSIS — N201 Calculus of ureter: Secondary | ICD-10-CM | POA: Diagnosis not present

## 2024-05-03 DIAGNOSIS — Z90710 Acquired absence of both cervix and uterus: Secondary | ICD-10-CM

## 2024-05-03 HISTORY — PX: NEPHROLITHOTOMY: SHX5134

## 2024-05-03 HISTORY — PX: IR NEPHROURETERAL CATH PLACE RIGHT: IMG6066

## 2024-05-03 LAB — COMPREHENSIVE METABOLIC PANEL WITH GFR
ALT: 19 U/L (ref 0–44)
AST: 21 U/L (ref 15–41)
Albumin: 4.3 g/dL (ref 3.5–5.0)
Alkaline Phosphatase: 40 U/L (ref 38–126)
Anion gap: 10 (ref 5–15)
BUN: 20 mg/dL (ref 8–23)
CO2: 21 mmol/L — ABNORMAL LOW (ref 22–32)
Calcium: 9.6 mg/dL (ref 8.9–10.3)
Chloride: 106 mmol/L (ref 98–111)
Creatinine, Ser: 1.2 mg/dL — ABNORMAL HIGH (ref 0.44–1.00)
GFR, Estimated: 48 mL/min — ABNORMAL LOW (ref >60)
Glucose, Bld: 144 mg/dL — ABNORMAL HIGH (ref 70–99)
Potassium: 4 mmol/L (ref 3.5–5.1)
Sodium: 137 mmol/L (ref 135–145)
Total Bilirubin: 0.7 mg/dL (ref 0.0–1.2)
Total Protein: 7.5 g/dL (ref 6.5–8.1)

## 2024-05-03 LAB — CBC
HCT: 31.4 % — ABNORMAL LOW (ref 36.0–46.0)
Hemoglobin: 10 g/dL — ABNORMAL LOW (ref 12.0–15.0)
MCH: 24.5 pg — ABNORMAL LOW (ref 26.0–34.0)
MCHC: 31.8 g/dL (ref 30.0–36.0)
MCV: 77 fL — ABNORMAL LOW (ref 80.0–100.0)
Platelets: 330 10*3/uL (ref 150–400)
RBC: 4.08 MIL/uL (ref 3.87–5.11)
RDW: 18.1 % — ABNORMAL HIGH (ref 11.5–15.5)
WBC: 3.5 10*3/uL — ABNORMAL LOW (ref 4.0–10.5)
nRBC: 0 % (ref 0.0–0.2)

## 2024-05-03 LAB — PROTIME-INR
INR: 1 (ref 0.8–1.2)
Prothrombin Time: 13.3 s (ref 11.4–15.2)

## 2024-05-03 LAB — GLUCOSE, CAPILLARY
Glucose-Capillary: 150 mg/dL — ABNORMAL HIGH (ref 70–99)
Glucose-Capillary: 242 mg/dL — ABNORMAL HIGH (ref 70–99)
Glucose-Capillary: 243 mg/dL — ABNORMAL HIGH (ref 70–99)

## 2024-05-03 LAB — ABO/RH: ABO/RH(D): A POS

## 2024-05-03 SURGERY — NEPHROLITHOTOMY PERCUTANEOUS
Anesthesia: General | Laterality: Right

## 2024-05-03 MED ORDER — OXYCODONE HCL 5 MG PO TABS
5.0000 mg | ORAL_TABLET | Freq: Once | ORAL | Status: AC | PRN
  Administered 2024-05-03: 5 mg via ORAL

## 2024-05-03 MED ORDER — PROPOFOL 10 MG/ML IV BOLUS
INTRAVENOUS | Status: AC
  Filled 2024-05-03: qty 20

## 2024-05-03 MED ORDER — SODIUM CHLORIDE 0.9 % IV SOLN: INTRAVENOUS | Status: DC

## 2024-05-03 MED ORDER — DEXAMETHASONE SODIUM PHOSPHATE 10 MG/ML IJ SOLN
INTRAMUSCULAR | Status: DC | PRN
  Administered 2024-05-03: 8 mg via INTRAVENOUS

## 2024-05-03 MED ORDER — ACETAMINOPHEN 325 MG PO TABS: 650.0000 mg | ORAL_TABLET | ORAL | Status: DC | PRN

## 2024-05-03 MED ORDER — ONDANSETRON HCL 4 MG/2ML IJ SOLN
INTRAMUSCULAR | Status: AC
  Filled 2024-05-03: qty 2

## 2024-05-03 MED ORDER — DOCUSATE SODIUM 100 MG PO CAPS
100.0000 mg | ORAL_CAPSULE | Freq: Two times a day (BID) | ORAL | Status: DC
  Administered 2024-05-04 – 2024-05-06 (×5): 100 mg via ORAL
  Filled 2024-05-03 (×5): qty 1

## 2024-05-03 MED ORDER — PHENYLEPHRINE HCL-NACL 20-0.9 MG/250ML-% IV SOLN
INTRAVENOUS | Status: DC | PRN
  Administered 2024-05-03: 20 ug/min via INTRAVENOUS

## 2024-05-03 MED ORDER — LACTATED RINGERS IV SOLN: INTRAVENOUS | Status: DC | PRN

## 2024-05-03 MED ORDER — EPHEDRINE SULFATE-NACL 50-0.9 MG/10ML-% IV SOSY
PREFILLED_SYRINGE | INTRAVENOUS | Status: DC | PRN
  Administered 2024-05-03 (×2): 5 mg via INTRAVENOUS

## 2024-05-03 MED ORDER — DROPERIDOL 2.5 MG/ML IJ SOLN
INTRAMUSCULAR | Status: AC
  Filled 2024-05-03: qty 2

## 2024-05-03 MED ORDER — INSULIN ASPART 100 UNIT/ML IJ SOLN
0.0000 [IU] | Freq: Three times a day (TID) | INTRAMUSCULAR | Status: DC
  Administered 2024-05-04: 3 [IU] via SUBCUTANEOUS
  Administered 2024-05-04: 2 [IU] via SUBCUTANEOUS
  Administered 2024-05-04: 3 [IU] via SUBCUTANEOUS
  Administered 2024-05-05: 2 [IU] via SUBCUTANEOUS
  Administered 2024-05-05: 3 [IU] via SUBCUTANEOUS
  Administered 2024-05-06: 5 [IU] via SUBCUTANEOUS
  Filled 2024-05-03 (×6): qty 1

## 2024-05-03 MED ORDER — LIDOCAINE HCL (CARDIAC) PF 100 MG/5ML IV SOSY
PREFILLED_SYRINGE | INTRAVENOUS | Status: DC | PRN
  Administered 2024-05-03: 60 mg via INTRAVENOUS

## 2024-05-03 MED ORDER — PHENYLEPHRINE HCL-NACL 20-0.9 MG/250ML-% IV SOLN
INTRAVENOUS | Status: AC
  Filled 2024-05-03: qty 250

## 2024-05-03 MED ORDER — ONDANSETRON HCL 4 MG/2ML IJ SOLN
4.0000 mg | Freq: Once | INTRAMUSCULAR | Status: AC
  Administered 2024-05-03: 4 mg via INTRAVENOUS

## 2024-05-03 MED ORDER — SUCCINYLCHOLINE CHLORIDE 200 MG/10ML IV SOSY
PREFILLED_SYRINGE | INTRAVENOUS | Status: DC | PRN
  Administered 2024-05-03: 60 mg via INTRAVENOUS

## 2024-05-03 MED ORDER — OXYCODONE HCL 5 MG/5ML PO SOLN: 5.0000 mg | Freq: Once | ORAL | Status: AC | PRN

## 2024-05-03 MED ORDER — IOHEXOL 180 MG/ML  SOLN
INTRAMUSCULAR | Status: DC | PRN
  Administered 2024-05-03: 60 mL

## 2024-05-03 MED ORDER — HYDROMORPHONE HCL 1 MG/ML IJ SOLN
INTRAMUSCULAR | Status: AC
  Filled 2024-05-03: qty 1

## 2024-05-03 MED ORDER — LIDOCAINE HCL (PF) 2 % IJ SOLN
INTRAMUSCULAR | Status: AC
  Filled 2024-05-03: qty 5

## 2024-05-03 MED ORDER — CHLORHEXIDINE GLUCONATE 0.12 % MT SOLN
15.0000 mL | Freq: Once | OROMUCOSAL | Status: AC
  Administered 2024-05-03: 15 mL via OROMUCOSAL

## 2024-05-03 MED ORDER — PANTOPRAZOLE SODIUM 40 MG PO TBEC
40.0000 mg | DELAYED_RELEASE_TABLET | Freq: Two times a day (BID) | ORAL | Status: DC
  Administered 2024-05-03 – 2024-05-06 (×6): 40 mg via ORAL
  Filled 2024-05-03 (×6): qty 1

## 2024-05-03 MED ORDER — ROSUVASTATIN CALCIUM 10 MG PO TABS
5.0000 mg | ORAL_TABLET | Freq: Every day | ORAL | Status: DC
  Administered 2024-05-04 – 2024-05-06 (×3): 5 mg via ORAL
  Filled 2024-05-03 (×3): qty 1

## 2024-05-03 MED ORDER — IOHEXOL 300 MG/ML  SOLN
15.0000 mL | Freq: Once | INTRAMUSCULAR | Status: AC | PRN
  Administered 2024-05-03: 15 mL

## 2024-05-03 MED ORDER — HYDROMORPHONE HCL 1 MG/ML IJ SOLN
0.5000 mg | INTRAMUSCULAR | Status: DC | PRN
  Administered 2024-05-03 – 2024-05-04 (×3): 1 mg via INTRAVENOUS
  Filled 2024-05-03 (×2): qty 1

## 2024-05-03 MED ORDER — CIPROFLOXACIN IN D5W 400 MG/200ML IV SOLN
400.0000 mg | INTRAVENOUS | Status: AC
  Administered 2024-05-03: 400 mg via INTRAVENOUS

## 2024-05-03 MED ORDER — ACETAMINOPHEN 10 MG/ML IV SOLN
INTRAVENOUS | Status: AC
  Filled 2024-05-03: qty 100

## 2024-05-03 MED ORDER — FENTANYL CITRATE (PF) 100 MCG/2ML IJ SOLN
INTRAMUSCULAR | Status: DC | PRN
  Administered 2024-05-03: 50 ug via INTRAVENOUS

## 2024-05-03 MED ORDER — MIDAZOLAM HCL 2 MG/2ML IJ SOLN
INTRAMUSCULAR | Status: DC | PRN
  Administered 2024-05-03: 1 mg via INTRAVENOUS

## 2024-05-03 MED ORDER — PROPOFOL 1000 MG/100ML IV EMUL
INTRAVENOUS | Status: AC
  Filled 2024-05-03: qty 100

## 2024-05-03 MED ORDER — DROPERIDOL 2.5 MG/ML IJ SOLN
0.6250 mg | Freq: Once | INTRAMUSCULAR | Status: AC | PRN
  Administered 2024-05-03: 0.625 mg via INTRAVENOUS

## 2024-05-03 MED ORDER — FENTANYL CITRATE (PF) 100 MCG/2ML IJ SOLN
INTRAMUSCULAR | Status: AC | PRN
  Administered 2024-05-03 (×2): 25 ug via INTRAVENOUS
  Administered 2024-05-03: 50 ug via INTRAVENOUS

## 2024-05-03 MED ORDER — ROCURONIUM BROMIDE 100 MG/10ML IV SOLN
INTRAVENOUS | Status: DC | PRN
  Administered 2024-05-03: 30 mg via INTRAVENOUS

## 2024-05-03 MED ORDER — FENTANYL CITRATE (PF) 100 MCG/2ML IJ SOLN
INTRAMUSCULAR | Status: AC
  Filled 2024-05-03: qty 2

## 2024-05-03 MED ORDER — OXYCODONE HCL 5 MG PO TABS
ORAL_TABLET | ORAL | Status: AC
  Filled 2024-05-03: qty 1

## 2024-05-03 MED ORDER — MIDAZOLAM HCL 2 MG/2ML IJ SOLN
INTRAMUSCULAR | Status: AC | PRN
  Administered 2024-05-03 (×2): .5 mg via INTRAVENOUS
  Administered 2024-05-03: 1 mg via INTRAVENOUS

## 2024-05-03 MED ORDER — MIDAZOLAM HCL 2 MG/2ML IJ SOLN
INTRAMUSCULAR | Status: AC
  Filled 2024-05-03: qty 2

## 2024-05-03 MED ORDER — ONDANSETRON HCL 4 MG/2ML IJ SOLN
4.0000 mg | INTRAMUSCULAR | Status: DC | PRN
  Administered 2024-05-03 – 2024-05-05 (×5): 4 mg via INTRAVENOUS
  Filled 2024-05-03 (×6): qty 2

## 2024-05-03 MED ORDER — HEPARIN SODIUM (PORCINE) 5000 UNIT/ML IJ SOLN
5000.0000 [IU] | Freq: Three times a day (TID) | INTRAMUSCULAR | Status: DC
  Administered 2024-05-04 – 2024-05-06 (×7): 5000 [IU] via SUBCUTANEOUS
  Filled 2024-05-03 (×6): qty 1

## 2024-05-03 MED ORDER — LIDOCAINE HCL 1 % IJ SOLN
10.0000 mL | Freq: Once | INTRAMUSCULAR | Status: AC
  Administered 2024-05-03: 10 mL via INTRADERMAL

## 2024-05-03 MED ORDER — OXYBUTYNIN CHLORIDE 5 MG PO TABS: 5.0000 mg | ORAL_TABLET | Freq: Three times a day (TID) | ORAL | Status: DC | PRN

## 2024-05-03 MED ORDER — SUGAMMADEX SODIUM 200 MG/2ML IV SOLN
INTRAVENOUS | Status: DC | PRN
  Administered 2024-05-03: 100 mg via INTRAVENOUS

## 2024-05-03 MED ORDER — SODIUM CHLORIDE 0.9 % IR SOLN
Status: DC | PRN
  Administered 2024-05-03 (×2): 12000 mL

## 2024-05-03 MED ORDER — ONDANSETRON HCL 4 MG/2ML IJ SOLN
4.0000 mg | Freq: Once | INTRAMUSCULAR | Status: AC | PRN
  Administered 2024-05-03: 4 mg via INTRAVENOUS

## 2024-05-03 MED ORDER — 0.9 % SODIUM CHLORIDE (POUR BTL) OPTIME: TOPICAL | Status: DC | PRN

## 2024-05-03 MED ORDER — HYDROCODONE-ACETAMINOPHEN 5-325 MG PO TABS
1.0000 | ORAL_TABLET | ORAL | Status: DC | PRN
  Administered 2024-05-03: 1 via ORAL
  Administered 2024-05-04 (×4): 2 via ORAL
  Filled 2024-05-03 (×2): qty 2
  Filled 2024-05-03: qty 1
  Filled 2024-05-03 (×2): qty 2

## 2024-05-03 MED ORDER — PHENYLEPHRINE 80 MCG/ML (10ML) SYRINGE FOR IV PUSH (FOR BLOOD PRESSURE SUPPORT)
PREFILLED_SYRINGE | INTRAVENOUS | Status: DC | PRN
  Administered 2024-05-03: 160 ug via INTRAVENOUS

## 2024-05-03 MED ORDER — ORAL CARE MOUTH RINSE: 15.0000 mL | Freq: Once | OROMUCOSAL | Status: AC

## 2024-05-03 MED ORDER — KETAMINE HCL 50 MG/5ML IJ SOSY
PREFILLED_SYRINGE | INTRAMUSCULAR | Status: AC
  Filled 2024-05-03: qty 5

## 2024-05-03 MED ORDER — LIDOCAINE HCL 1 % IJ SOLN
INTRAMUSCULAR | Status: AC
  Filled 2024-05-03: qty 20

## 2024-05-03 MED ORDER — CIPROFLOXACIN IN D5W 400 MG/200ML IV SOLN
INTRAVENOUS | Status: AC
  Filled 2024-05-03: qty 200

## 2024-05-03 MED ORDER — STERILE WATER FOR IRRIGATION IR SOLN
Status: DC | PRN
  Administered 2024-05-03: 1000 mL

## 2024-05-03 MED ORDER — FENTANYL CITRATE (PF) 100 MCG/2ML IJ SOLN: 25.0000 ug | INTRAMUSCULAR | Status: DC | PRN

## 2024-05-03 MED ORDER — ACETAMINOPHEN 10 MG/ML IV SOLN
INTRAVENOUS | Status: DC | PRN
  Administered 2024-05-03: 1000 mg via INTRAVENOUS

## 2024-05-03 MED ORDER — CHLORHEXIDINE GLUCONATE 0.12 % MT SOLN
OROMUCOSAL | Status: AC
Start: 2024-05-03 — End: ?
  Filled 2024-05-03: qty 15

## 2024-05-03 MED ORDER — FENTANYL CITRATE (PF) 100 MCG/2ML IJ SOLN
25.0000 ug | INTRAMUSCULAR | Status: DC | PRN
  Administered 2024-05-03 (×4): 25 ug via INTRAVENOUS

## 2024-05-03 MED ORDER — PROPOFOL 10 MG/ML IV BOLUS
INTRAVENOUS | Status: DC | PRN
  Administered 2024-05-03: 100 mg via INTRAVENOUS
  Administered 2024-05-03: 20 mg via INTRAVENOUS
  Administered 2024-05-03: 25 ug/kg/min via INTRAVENOUS

## 2024-05-03 SURGICAL SUPPLY — 60 items
ADAPTER IRRIG TUBE 2 SPIKE SOL (ADAPTER) ×2 IMPLANT
BAG URINE DRAIN 2000ML AR STRL (UROLOGICAL SUPPLIES) ×1 IMPLANT
BALLN NEPHROSTOMY 10X15 (UROLOGICAL SUPPLIES) ×1 IMPLANT
BASKET ZERO TIP 1.9FR (BASKET) IMPLANT
BLADE SURG 15 STRL LF DISP TIS (BLADE) ×1 IMPLANT
CATH COUNCIL 22FR (CATHETERS) IMPLANT
CATH FOLEY 2W COUNCIL 20FR 5CC (CATHETERS) IMPLANT
CATH FOLEY 2W COUNCIL 5CC 18FR (CATHETERS) IMPLANT
CATH STENT KAYE NEPHR TAMP (CATHETERS) IMPLANT
CATH URET FLEX-TIP 2 LUMEN 10F (CATHETERS) IMPLANT
CATH URETL OPEN 5X70 (CATHETERS) ×1 IMPLANT
CHLORAPREP W/TINT 26 (MISCELLANEOUS) ×1 IMPLANT
CNTNR URN SCR LID CUP LEK RST (MISCELLANEOUS) ×1 IMPLANT
COVER EZ STRL 42X30 (DRAPES) ×1 IMPLANT
DRAPE C-ARM XRAY 36X54 (DRAPES) ×1 IMPLANT
DRAPE SHEET LG 3/4 BI-LAMINATE (DRAPES) ×1 IMPLANT
GAUZE 4X4 16PLY ~~LOC~~+RFID DBL (SPONGE) ×1 IMPLANT
GAUZE SPONGE 4X4 12PLY STRL (GAUZE/BANDAGES/DRESSINGS) ×1 IMPLANT
GLIDEWIRE STIFF .35X180X3 HYDR (WIRE) IMPLANT
GLOVE BIOGEL PI IND STRL 7.5 (GLOVE) ×1 IMPLANT
GOWN STRL REUS W/ TWL LRG LVL3 (GOWN DISPOSABLE) ×1 IMPLANT
GOWN STRL REUS W/ TWL XL LVL3 (GOWN DISPOSABLE) ×1 IMPLANT
GUIDEWIRE GREEN .038 145CM (MISCELLANEOUS) IMPLANT
GUIDEWIRE INTRO SET STRAIGHT (WIRE) ×1 IMPLANT
GUIDEWIRE STR DUAL SENSOR (WIRE) IMPLANT
GUIDEWIRE STR ZIPWIRE 035X150 (MISCELLANEOUS) IMPLANT
GUIDEWIRE SUPER STIFF (WIRE) ×2 IMPLANT
HOLDER FOLEY CATH W/STRAP (MISCELLANEOUS) ×1 IMPLANT
KIT PROBE TRILOGY 3.9X350 (MISCELLANEOUS) ×1 IMPLANT
LASER FIB FLEXIVA PULSE ID 365 (Laser) IMPLANT
MANIFOLD NEPTUNE II (INSTRUMENTS) ×2 IMPLANT
MAT ABSORB FLUID 56X50 GRAY (MISCELLANEOUS) ×2 IMPLANT
NDL FASCIA INCISION 18GA (NEEDLE) ×1 IMPLANT
PACK BASIN MINOR ARMC (MISCELLANEOUS) ×1 IMPLANT
PAD ABD DERMACEA PRESS 5X9 (GAUZE/BANDAGES/DRESSINGS) ×2 IMPLANT
PAD PREP OB/GYN DISP 24X41 (PERSONAL CARE ITEMS) ×1 IMPLANT
SET CYSTO W/LG BORE CLAMP LF (SET/KITS/TRAYS/PACK) ×1 IMPLANT
SET IRRIGATING DISP (SET/KITS/TRAYS/PACK) ×1 IMPLANT
SHEET NEURO XL SOL CTL (MISCELLANEOUS) ×1 IMPLANT
SOL .9 NS 3000ML IRR UROMATIC (IV SOLUTION) ×4 IMPLANT
SPONGE DRAIN TRACH 4X4 STRL 2S (GAUZE/BANDAGES/DRESSINGS) ×1 IMPLANT
STENT URET 6FRX24 CONTOUR (STENTS) IMPLANT
STENT URET 6FRX26 CONTOUR (STENTS) IMPLANT
STRAP SAFETY 5IN WIDE (MISCELLANEOUS) ×3 IMPLANT
SURGILUBE 2OZ TUBE FLIPTOP (MISCELLANEOUS) ×1 IMPLANT
SUT ETHILON 2 0 FS 18 (SUTURE) IMPLANT
SUTURE MNCRL 4-0 27XMF (SUTURE) IMPLANT
SYR 10ML LL (SYRINGE) ×1 IMPLANT
SYR 20ML LL LF (SYRINGE) ×1 IMPLANT
SYR 30ML LL (SYRINGE) ×2 IMPLANT
SYRINGE TOOMEY IRRIG 70ML (MISCELLANEOUS) ×1 IMPLANT
TAPE CLOTH 3X10 WHT NS LF (GAUZE/BANDAGES/DRESSINGS) ×1 IMPLANT
TAPE MICROFOAM 4IN (TAPE) ×1 IMPLANT
TRAP FLUID SMOKE EVACUATOR (MISCELLANEOUS) ×1 IMPLANT
TRAP SPECIMEN MUCUS 40CC (MISCELLANEOUS) ×1 IMPLANT
TRAY FOLEY MTR SLVR 16FR STAT (SET/KITS/TRAYS/PACK) ×1 IMPLANT
TUBING CONNECTING 10 (TUBING) ×1 IMPLANT
VALVE UROSEAL ADJ ENDO (VALVE) ×1 IMPLANT
WATER STERILE IRR 1000ML POUR (IV SOLUTION) ×1 IMPLANT
WATER STERILE IRR 500ML POUR (IV SOLUTION) ×1 IMPLANT

## 2024-05-03 NOTE — Op Note (Signed)
 Date of procedure: 05/03/24  Preoperative diagnosis:  Right staghorn stone, 5 cm  Postoperative diagnosis:  Same   Procedure: Right percutaneous nephrolithotomy, ~5 cm   Surgeon: Jay Meth, MD   Anesthesia: General   Complications: None   Intraoperative findings:  1.  Complete right staghorn stone, renal pelvis and lower pole stone fragmented and suctioned free, unable to access 1 cm of stone in the midpole 2.  Right ureteral stent placed, 20 French Foley as nephrostomy tube   EBL: 25 mL   Specimens: Stone for analysis   Drains: 20 French Foley as nephrostomy tube, 6 [place] by 24 cm right ureteral stent, 75F urethral foley   Indication: Holly Matthews is a 73 year old female with right staghorn stone who opted for definitive management with PCNL.  After reviewing the management options for treatment, they elected to proceed with the above surgical procedure(s). We have discussed the potential benefits and risks of the procedure, side effects of the proposed treatment, the likelihood of the patient achieving the goals of the procedure, and any potential problems that might occur during the procedure or recuperation. Informed consent has been obtained.   Description of procedure: A right upper pole nephrostomy tube access was placed by interventional radiology earlier in the day.  Please see their note for details.   The patient was taken to the operating room and general anesthesia was induced. SCDs were placed for DVT prophylaxis.  Cipro was given for antibiotics.   An 28 French two-way Foley was then placed in the bladder and 10 cc placed in the balloon.   The patient was then flipped into the prone position and meticulously padded.  She was prepped and draped in standard sterile fashion, and a drape was applied over the nephrostomy tube access.  I started by advancing a Super Stiff wire through the nephroureteral catheter and this was passed down to the bladder under  fluoroscopic vision, and the nephrostomy catheter was removed.  A dual-lumen ureteral access catheter was advanced over the wire into the proximal ureter, and a second Super Stiff wire was added.     A 1 cm skin incision was made, and the fascial incision device was advanced over the wire in a cruciate fashion.  The UroMax PCNL balloon was advanced over the Super Stiff wire under fluoroscopic vision and inflated to a total of 20 ATM when positioned across the kidney.  Under fluoroscopic vision, the sheath was then carefully advanced over the balloon and into the kidney onto the stone.  The nephroscope with Trilogy device was inserted into the kidney and vision was excellent.  There was a large yellow stone that encompassed the entire renal pelvis, as well as the lower pole calyx and UPJ.  Over about 30 minutes the stone was carefully fragmented and suctioned free using pneumatic and ultrasonic energy.  All visible stone was removed.  A flexible nephroscope was inserted into the kidney and under fluoroscopy I could see two midpole calyxes with residual stone.  I was able to gently advance the nephroscope into the lower midpole calyx and a 365 m laser fiber and settings of 15 J and 15 Hz was used to methodically fragment the stone.  The rigid nephroscope was then reinserted and all fragments were suctioned free.  Under fluoroscopy I could still see a 1 cm fragment almost directly behind the sheath.  I could not access this with either the rigid nephroscope or flexible nephroscope.   The flexible nephroscope was reinserted and  contrast injected from just above the UPJ, this clearly opacified the ureter down to the bladder, no extravasation was seen.   The nephroscope was backloaded over the wire and a 6 [place] by 24 cm ureteral stent was uneventfully placed with a curl in the bladder, as well as under direct vision in the renal pelvis.     The wire was pulled back into the kidney, and the 18 [place] council  Foley was placed through the sheath over the wire into the kidney under fluoroscopic vision.  Only 4 cc were placed in the balloon, and this was located in the renal pelvis.  Contrast was injected through the tube and clearly opacified the collecting system with no extravasation seen.  The sheath was removed and pressure was applied.  The Foley was sutured into place and secured.  Urine was light pink.   A dressing with gauze, ABD pads, and foam tape was applied, and both the Foley nephrostomy tube and the urethral Foley were connected to drainage.   Disposition: Stable to PACU   Plan: -Anticipate nephrostomy tube removal tomorrow morning, Foley catheter removal in the late afternoon and discharge home -Follow-up for KUB in 2 weeks and stent removal. Will also discuss 2nd look ureteroscopy in ~2 weeks for residual 1cm midpole stone if patient interested

## 2024-05-03 NOTE — Anesthesia Procedure Notes (Signed)
 Procedure Name: Intubation Date/Time: 05/03/2024 9:59 AM  Performed by: Wilkins Hardy I, CRNAPre-anesthesia Checklist: Patient identified, Emergency Drugs available, Suction available and Patient being monitored Patient Re-evaluated:Patient Re-evaluated prior to induction Oxygen Delivery Method: Circle system utilized Preoxygenation: Pre-oxygenation with 100% oxygen Induction Type: IV induction Ventilation: Mask ventilation without difficulty Laryngoscope Size: McGrath and 3 Tube type: Oral Tube size: 6.5 mm Number of attempts: 1 Airway Equipment and Method: Stylet and Oral airway Placement Confirmation: ETT inserted through vocal cords under direct vision, positive ETCO2 and breath sounds checked- equal and bilateral Secured at: 20 cm Tube secured with: Tape Dental Injury: Teeth and Oropharynx as per pre-operative assessment

## 2024-05-03 NOTE — Progress Notes (Signed)
 Patient resting at intervals. Offered lunch tray, states no hungry at this time. Tolerating po fluids. Medicated for discomfort as ordered.  Family updated.

## 2024-05-03 NOTE — Anesthesia Preprocedure Evaluation (Signed)
 Anesthesia Evaluation  Patient identified by MRN, date of birth, ID band Patient awake    Reviewed: Allergy & Precautions, NPO status , Patient's Chart, lab work & pertinent test results  History of Anesthesia Complications Negative for: history of anesthetic complications  Airway Mallampati: III  TM Distance: >3 FB Neck ROM: Full    Dental no notable dental hx. (+) Teeth Intact   Pulmonary neg sleep apnea, pneumonia, resolved, neg COPD, Patient abstained from smoking.Not current smoker Recent double pneumonia, resolved 3 weeks ago. Patient stayed overnight in hospital when first diagnosed due to respiratory distress. Breathing feels well today. No pre existing lung disease. No residual cough   Pulmonary exam normal breath sounds clear to auscultation       Cardiovascular Exercise Tolerance: Good METS(-) hypertension(-) CAD and (-) Past MI (-) dysrhythmias  Rhythm:Regular Rate:Normal - Systolic murmurs TTE 2019: INTERPRETATION  NORMAL LEFT VENTRICULAR SYSTOLIC FUNCTION   WITH MILD LVH  NORMAL RIGHT VENTRICULAR SYSTOLIC FUNCTION  MILD VALVULAR REGURGITATION (See above)  NO VALVULAR STENOSIS  MILD MR, TR, PR  TRIVIAL AR  EF 50-55%  Closest EF: >55% (Estimated)     Neuro/Psych negative neurological ROS  negative psych ROS   GI/Hepatic hiatal hernia,GERD  Medicated and Controlled,,(+)     (-) substance abuse    Endo/Other  diabetes, Well Controlled, Oral Hypoglycemic Agents    Renal/GU negative Renal ROS     Musculoskeletal   Abdominal   Peds  Hematology   Anesthesia Other Findings Past Medical History: 03/23/2024: Acute respiratory failure with hypoxia (HCC) 05/06/2015: Adenomatous polyp     Comment:  Had repeat colonoscopy 2014.    Repeat in 5 years. No date: Allergy No date: CHF (congestive heart failure) (HCC) No date: CKD (chronic kidney disease), stage II No date: DM (diabetes mellitus), type 2  (HCC) No date: GERD (gastroesophageal reflux disease) 05/06/2015: History of colon polyps No date: History of hiatal hernia No date: Hydronephrosis of right kidney No date: Hyperlipidemia No date: Microcytic anemia 03/23/2024: Pneumonia No date: RBBB (right bundle branch block) 03/23/2024: Sepsis (HCC) No date: Serum cholesterol elevated No date: Staghorn renal calculus No date: Vitamin D  deficiency  Reproductive/Obstetrics                             Anesthesia Physical Anesthesia Plan  ASA: 2  Anesthesia Plan: General   Post-op Pain Management: Ofirmev  IV (intra-op)*   Induction: Intravenous  PONV Risk Score and Plan: 4 or greater and Ondansetron , Dexamethasone and Treatment may vary due to age or medical condition  Airway Management Planned: Oral ETT and Video Laryngoscope Planned  Additional Equipment: None  Intra-op Plan:   Post-operative Plan: Extubation in OR  Informed Consent: I have reviewed the patients History and Physical, chart, labs and discussed the procedure including the risks, benefits and alternatives for the proposed anesthesia with the patient or authorized representative who has indicated his/her understanding and acceptance.     Dental advisory given  Plan Discussed with: CRNA and Surgeon  Anesthesia Plan Comments: (Discussed risks of anesthesia with patient, including PONV, sore throat, lip/dental/eye damage. Rare risks discussed as well, such as cardiorespiratory and neurological sequelae, and allergic reactions. Discussed the role of CRNA in patient's perioperative care. Patient understands.)       Anesthesia Quick Evaluation

## 2024-05-03 NOTE — H&P (Signed)
   05/03/24 7:33 AM   Antoine Bathe T Reilly 08-05-1951 [id number]  CC: Right staghorn stone  HPI: 73 yo F with 5cm complete right staghorn stone here today for PCNL. Denies flank pain, CP, SOB. Pre op urine culture klebsiella treated with Bactrim .   PMH: Past Medical History:  Diagnosis Date   Acute respiratory failure with hypoxia (HCC) 03/23/2024   Adenomatous polyp 05/06/2015   Had repeat colonoscopy 2014.    Repeat in 5 years.   Allergy    CHF (congestive heart failure) (HCC)    CKD (chronic kidney disease), stage II    DM (diabetes mellitus), type 2 (HCC)    GERD (gastroesophageal reflux disease)    History of colon polyps 05/06/2015   History of hiatal hernia    Hydronephrosis of right kidney    Hyperlipidemia    Microcytic anemia    Pneumonia 03/23/2024   RBBB (right bundle branch block)    Sepsis (HCC) 03/23/2024   Serum cholesterol elevated    Staghorn renal calculus    Vitamin D  deficiency     Surgical History: Past Surgical History:  Procedure Laterality Date   ABDOMINAL HYSTERECTOMY  1980   APPENDECTOMY  1967   COLONOSCOPY     COLONOSCOPY WITH PROPOFOL  N/A 11/06/2018   Procedure: COLONOSCOPY WITH PROPOFOL ;  Surgeon: Deveron Fly, MD;  Location: [facility] ENDOSCOPY;  Service: Endoscopy;  Laterality: N/A;   ESOPHAGOGASTRODUODENOSCOPY     ESOPHAGOGASTRODUODENOSCOPY (EGD) WITH PROPOFOL  N/A 11/06/2018   Procedure: ESOPHAGOGASTRODUODENOSCOPY (EGD) WITH PROPOFOL ;  Surgeon: Deveron Fly, MD;  Location: [facility] ENDOSCOPY;  Service: Endoscopy;  Laterality: N/A;     Family History: Family History  Problem Relation Age of Onset   Hypertension Father    CVA Father    Healthy Sister    Healthy Sister    Diabetes Brother    Healthy Brother    Diabetes Son    Colon cancer Maternal Aunt    Breast cancer Neg Hx    Ovarian cancer Neg Hx     Social History:  reports that she has never smoked. She has never used smokeless tobacco. She reports that she does not  drink alcohol and does not use drugs.  Physical Exam: BP (!) 111/92   Pulse 77   Temp (!) 97.1 F (36.2 C)   Resp 18   Ht 5' (1.524 m)   Wt 54.4 kg   SpO2 98%   BMI 23.42 kg/m    Constitutional:  Alert and oriented, No acute distress. Cardiovascular: RRR Respiratory: Clear to auscultation B/L GI: Abdomen is soft, nontender, nondistended, no abdominal masses   Laboratory Data: Reviewed, see HPI  Pertinent Imaging: I have personally viewed and interpreted the CT showing right 5cm staghorn stone, upper pole dilation.  Assessment & Plan:   73 yo F with right staghorn kidney stone.  We specifically discussed the risks of PCNL including bleeding, infection/sepsis, stent related symptoms including flank pain/urgency/frequency/incontinence/dysuria, ureteral/renal injury, ureteral stricture, pulmonary complications, inability to access stone, or need for staged or additional procedures.  RIGHT PCNL today  Jay Meth, MD 05/03/2024  [facility] [address] [place], [place] [address] [phone]

## 2024-05-03 NOTE — Plan of Care (Signed)

## 2024-05-03 NOTE — Procedures (Signed)
 Interventional Radiology Procedure Note  Procedure: Image guided right renal access for pending nephrolithotomy.   Findings: Superior/posterior calyx access.  65cm 70F catheter to the bladder.  This will accept any 035 wire to the bladder  Complications: None  EBL: None    Recommendations: - NPO for OR today - routine wound care    Signed,  Marciano Settles. Mabel Savage, DO

## 2024-05-03 NOTE — Transfer of Care (Signed)
 Immediate Anesthesia Transfer of Care Note  Patient: Holly Matthews  Procedure(s) Performed: NEPHROLITHOTOMY PERCUTANEOUS (Right)  Patient Location: PACU  Anesthesia Type:General  Level of Consciousness: responds to stimulation  Airway & Oxygen Therapy: Patient Spontanous Breathing and Patient connected to face mask oxygen  Post-op Assessment: Report given to RN and Post -op Vital signs reviewed and stable  Post vital signs: stable  Last Vitals:  Vitals Value Taken Time  BP 97/61 05/03/24 1145  Temp    Pulse 81 05/03/24 1147  Resp 35 05/03/24 1147  SpO2 100 % 05/03/24 1147  Vitals shown include unfiled device data.  Last Pain:  Vitals:   05/03/24 0618  PainSc: 0-No pain         Complications: No notable events documented.

## 2024-05-04 ENCOUNTER — Encounter: Payer: Self-pay | Admitting: Urology

## 2024-05-04 ENCOUNTER — Other Ambulatory Visit: Payer: Self-pay

## 2024-05-04 DIAGNOSIS — N200 Calculus of kidney: Secondary | ICD-10-CM

## 2024-05-04 DIAGNOSIS — N201 Calculus of ureter: Secondary | ICD-10-CM

## 2024-05-04 LAB — CBC
HCT: 23 % — ABNORMAL LOW (ref 36.0–46.0)
Hemoglobin: 7.2 g/dL — ABNORMAL LOW (ref 12.0–15.0)
MCH: 25.1 pg — ABNORMAL LOW (ref 26.0–34.0)
MCHC: 31.3 g/dL (ref 30.0–36.0)
MCV: 80.1 fL (ref 80.0–100.0)
Platelets: 264 10*3/uL (ref 150–400)
RBC: 2.87 MIL/uL — ABNORMAL LOW (ref 3.87–5.11)
RDW: 18.5 % — ABNORMAL HIGH (ref 11.5–15.5)
WBC: 13.1 10*3/uL — ABNORMAL HIGH (ref 4.0–10.5)
nRBC: 0 % (ref 0.0–0.2)

## 2024-05-04 LAB — HEMOGLOBIN AND HEMATOCRIT, BLOOD
HCT: 23 % — ABNORMAL LOW (ref 36.0–46.0)
Hemoglobin: 7 g/dL — ABNORMAL LOW (ref 12.0–15.0)

## 2024-05-04 LAB — GLUCOSE, CAPILLARY
Glucose-Capillary: 133 mg/dL — ABNORMAL HIGH (ref 70–99)
Glucose-Capillary: 193 mg/dL — ABNORMAL HIGH (ref 70–99)

## 2024-05-04 MED ORDER — TRAMADOL HCL 50 MG PO TABS
25.0000 mg | ORAL_TABLET | Freq: Four times a day (QID) | ORAL | Status: DC | PRN
  Administered 2024-05-04 (×2): 50 mg via ORAL
  Administered 2024-05-05 – 2024-05-06 (×5): 100 mg via ORAL
  Filled 2024-05-04: qty 1
  Filled 2024-05-04: qty 2
  Filled 2024-05-04: qty 1
  Filled 2024-05-04 (×4): qty 2

## 2024-05-04 MED ORDER — CHLORHEXIDINE GLUCONATE CLOTH 2 % EX PADS
6.0000 | MEDICATED_PAD | Freq: Every day | CUTANEOUS | Status: DC
  Administered 2024-05-04: 6 via TOPICAL

## 2024-05-04 MED ORDER — SODIUM CHLORIDE 0.9 % IV BOLUS
500.0000 mL | Freq: Once | INTRAVENOUS | Status: AC
  Administered 2024-05-04: 500 mL via INTRAVENOUS

## 2024-05-04 MED ORDER — DEXTROSE-SODIUM CHLORIDE 5-0.45 % IV SOLN: INTRAVENOUS | Status: AC

## 2024-05-04 MED ORDER — TRAMADOL HCL 50 MG PO TABS: 25.0000 mg | ORAL_TABLET | Freq: Four times a day (QID) | ORAL | Status: DC

## 2024-05-04 NOTE — Care Management Important Message (Signed)
 Important Message  Patient Details  Name: Holly Matthews MRN: [id number] Date of Birth: 31-Dec-1950   Important Message Given:  Yes - Medicare IM     Anise Kerns 05/04/2024, 1:16 PM

## 2024-05-04 NOTE — Care Management Obs Status (Signed)
 MEDICARE OBSERVATION STATUS NOTIFICATION   Patient Details  Name: Holly Matthews MRN: [id number] Date of Birth: 10-15-1951   Medicare Observation Status Notification Given:  Yes    Anise Kerns 05/04/2024, 10:29 AM

## 2024-05-04 NOTE — Progress Notes (Signed)
 Urology Inpatient Progress Note  Subjective: No acute events overnight. She is afebrile, VSS, and oxygenating well on room air. Hemoglobin and hematocrit were down this morning, 7.2 and 23.0, respectively.  WBC count up, 13.1.  Repeat H&H were stable at 7.0 and 23.0, respectively. She reports ongoing nausea and chest discomfort, which was improved by getting out of bed this morning.  She denies palpitations.  No BLE pain, redness, or edema. Right nephrostomy tube in place draining pink urine without clots.  Foley catheter in place draining yellow urine.  Anti-infectives: Anti-infectives (From admission, onward)    Start     Dose/Rate Route Frequency Ordered Stop   05/03/24 0610  ciprofloxacin (CIPRO) IVPB 400 mg        400 mg 200 mL/hr over 60 Minutes Intravenous 60 min pre-op 05/03/24 0610 05/03/24 0958       Current Facility-Administered Medications  Medication Dose Route Frequency Provider Last Rate Last Admin   acetaminophen  (TYLENOL ) tablet 650 mg  650 mg Oral Q4H PRN Lawerence Pressman, MD       Chlorhexidine Gluconate Cloth 2 % PADS 6 each  6 each Topical Daily Lawerence Pressman, MD   6 each at 05/04/24 1210   docusate sodium (COLACE) capsule 100 mg  100 mg Oral BID Jay Meth C, MD   100 mg at 05/04/24 0826   heparin  injection 5,000 Units  5,000 Units Subcutaneous Q8H Lawerence Pressman, MD   5,000 Units at 05/04/24 0421   HYDROcodone -acetaminophen  (NORCO/VICODIN) 5-325 MG per tablet 1-2 tablet  1-2 tablet Oral Q4H PRN Lawerence Pressman, MD   2 tablet at 05/04/24 0825   HYDROmorphone (DILAUDID) injection 0.5-1 mg  0.5-1 mg Intravenous Q2H PRN Lawerence Pressman, MD   1 mg at 05/04/24 1209   insulin  aspart (novoLOG ) injection 0-15 Units  0-15 Units Subcutaneous TID WC Lawerence Pressman, MD   3 Units at 05/04/24 1209   ondansetron  (ZOFRAN ) injection 4 mg  4 mg Intravenous Q4H PRN Lawerence Pressman, MD   4 mg at 05/04/24 1125   oxybutynin (DITROPAN) tablet 5 mg  5 mg Oral Q8H PRN Lawerence Pressman, MD       pantoprazole  (PROTONIX ) EC tablet 40 mg  40 mg Oral BID Jay Meth C, MD   40 mg at 05/04/24 1610   rosuvastatin  (CRESTOR ) tablet 5 mg  5 mg Oral Daily Lawerence Pressman, MD   5 mg at 05/04/24 9604   Objective: Vital signs in last 24 hours: Temp:  [97.6 F (36.4 C)-98.2 F (36.8 C)] 98.1 F (36.7 C) (05/16 0853) Pulse Rate:  [67-90] 71 (05/16 0853) Resp:  [10-20] 19 (05/16 0853) BP: (97-122)/(44-77) 109/44 (05/16 0853) SpO2:  [92 %-97 %] 96 % (05/16 0853)  Intake/Output from previous day: 05/15 0701 - 05/16 0700 In: 2225 [P.O.:225; I.V.:1900; IV Piggyback:100] Out: 405 [Urine:250; Emesis/NG output:110; Drains:20; Blood:25] Intake/Output this shift: Total I/O In: -  Out: 50 [Emesis/NG output:50]  Physical Exam Vitals and nursing note reviewed.  Constitutional:      General: She is not in acute distress.    Appearance: She is not ill-appearing, toxic-appearing or diaphoretic.  HENT:     Head: Normocephalic and atraumatic.  Pulmonary:     Effort: Pulmonary effort is normal. No respiratory distress.  Abdominal:     Palpations: Abdomen is soft.     Tenderness: There is no rebound.  Skin:    General: Skin is warm and dry.  Neurological:  Mental Status: She is alert and oriented to person, place, and time.  Psychiatric:        Mood and Affect: Mood normal.        Behavior: Behavior normal.    Lab Results:  Recent Labs    05/03/24 0625 05/04/24 0605 05/04/24 1007  WBC 3.5* 13.1*  --   HGB 10.0* 7.2* 7.0*  HCT 31.4* 23.0* 23.0*  PLT 330 264  --    BMET Recent Labs    05/03/24 0625  NA 137  K 4.0  CL 106  CO2 21*  GLUCOSE 144*  BUN 20  CREATININE 1.20*  CALCIUM  9.6   PT/INR Recent Labs    05/03/24 0625  LABPROT 13.3  INR 1.0   Assessment & Plan: 73 y.o. female with a right staghorn stone s/p right PCNL with Dr. Estanislao Heimlich yesterday.  Notable drop in blood counts overnight, though this is stable this morning.  Her vitals are  stable, though she continues to have some nausea. Will continue to monitor.  Plan: -ADAT -Daily CBC, BMP -Pain, nausea control -Continue RT PCN and Foley  Kathreen Pare, PA-C 05/04/2024

## 2024-05-05 LAB — BASIC METABOLIC PANEL WITH GFR
Anion gap: 4 — ABNORMAL LOW (ref 5–15)
BUN: 10 mg/dL (ref 8–23)
CO2: 22 mmol/L (ref 22–32)
Calcium: 8.4 mg/dL — ABNORMAL LOW (ref 8.9–10.3)
Chloride: 109 mmol/L (ref 98–111)
Creatinine, Ser: 0.91 mg/dL (ref 0.44–1.00)
GFR, Estimated: >60 mL/min (ref >60)
Glucose, Bld: 147 mg/dL — ABNORMAL HIGH (ref 70–99)
Potassium: 3.7 mmol/L (ref 3.5–5.1)
Sodium: 135 mmol/L (ref 135–145)

## 2024-05-05 LAB — HEMOGLOBIN AND HEMATOCRIT, BLOOD
HCT: 21.4 % — ABNORMAL LOW (ref 36.0–46.0)
HCT: 32.8 % — ABNORMAL LOW (ref 36.0–46.0)
Hemoglobin: 6.6 g/dL — ABNORMAL LOW (ref 12.0–15.0)
Hemoglobin: 10.6 g/dL — ABNORMAL LOW (ref 12.0–15.0)

## 2024-05-05 LAB — PREPARE RBC (CROSSMATCH)

## 2024-05-05 MED ORDER — SODIUM CHLORIDE 0.9% IV SOLUTION: Freq: Once | INTRAVENOUS | Status: AC

## 2024-05-05 NOTE — Progress Notes (Signed)
 Urology Inpatient Progress Note  Subjective: No acute events overnight. Hgb drifted down to 6.6 this AM and blood transfusion (2 uPRBCs) ordered. She is getting eth first at the time of my exam  She endorses pain and is on nasal canula. Foley removed this AM  Anti-infectives: Anti-infectives (From admission, onward)    Start     Dose/Rate Route Frequency Ordered Stop   05/03/24 0610  ciprofloxacin  (CIPRO ) IVPB 400 mg        400 mg 200 mL/hr over 60 Minutes Intravenous 60 min pre-op 05/03/24 0610 05/03/24 0958       Current Facility-Administered Medications  Medication Dose Route Frequency Provider Last Rate Last Admin   acetaminophen  (TYLENOL ) tablet 650 mg  650 mg Oral Q4H PRN Lawerence Pressman, MD       dextrose  5 % and 0.45 % NaCl infusion   Intravenous Continuous Lawerence Pressman, MD 75 mL/hr at 05/05/24 0645 Infusion Verify at 05/05/24 0645   docusate sodium  (COLACE) capsule 100 mg  100 mg Oral BID Lawerence Pressman, MD   100 mg at 05/05/24 1610   heparin  injection 5,000 Units  5,000 Units Subcutaneous Q8H Lawerence Pressman, MD   5,000 Units at 05/05/24 0507   HYDROmorphone  (DILAUDID ) injection 0.5-1 mg  0.5-1 mg Intravenous Q2H PRN Lawerence Pressman, MD   1 mg at 05/04/24 1209   insulin  aspart (novoLOG ) injection 0-15 Units  0-15 Units Subcutaneous TID WC Lawerence Pressman, MD   3 Units at 05/05/24 1225   ondansetron  (ZOFRAN ) injection 4 mg  4 mg Intravenous Q4H PRN Lawerence Pressman, MD   4 mg at 05/05/24 9604   oxybutynin  (DITROPAN ) tablet 5 mg  5 mg Oral Q8H PRN Lawerence Pressman, MD       pantoprazole  (PROTONIX ) EC tablet 40 mg  40 mg Oral BID Lawerence Pressman, MD   40 mg at 05/05/24 5409   rosuvastatin  (CRESTOR ) tablet 5 mg  5 mg Oral Daily Lawerence Pressman, MD   5 mg at 05/05/24 8119   traMADol  (ULTRAM ) tablet 25-100 mg  25-100 mg Oral Q6H PRN Vaillancourt, Samantha, PA-C   100 mg at 05/05/24 1022   Objective: Vital signs in last 24 hours: Temp:  [97.9 F (36.6 C)-98.8 F  (37.1 C)] 98.8 F (37.1 C) (05/17 1056) Pulse Rate:  [81-90] 88 (05/17 1056) Resp:  [16-19] 16 (05/17 1056) BP: (94-115)/(50-61) 104/55 (05/17 1056) SpO2:  [90 %-94 %] 94 % (05/17 1056)  Intake/Output from previous day: 05/16 0701 - 05/17 0700 In: 1045.4 [I.V.:1045.4] Out: 1425 [Urine:475; Emesis/NG output:150; Drains:800] Intake/Output this shift: Total I/O In: -  Out: 600 [Drains:600]  Physical Exam Vitals and nursing note reviewed.  Constitutional:      General: She is not in acute distress.    Appearance: She is not ill-appearing, toxic-appearing or diaphoretic.  HENT:     Head: Normocephalic and atraumatic.  Pulmonary:     Effort: Pulmonary effort is normal. No respiratory distress.  Abdominal:     Palpations: Abdomen is soft.     Tenderness: There is no rebound.  Skin:    General: Skin is warm and dry.  Neurological:     Mental Status: She is alert and oriented to person, place, and time.  Psychiatric:        Mood and Affect: Mood normal.        Behavior: Behavior normal.   Neph tube draining clear urine  Lab Results:  Recent Labs  05/03/24 0625 05/04/24 0605 05/04/24 1007 05/05/24 0548  WBC 3.5* 13.1*  --   --   HGB 10.0* 7.2* 7.0* 6.6*  HCT 31.4* 23.0* 23.0* 21.4*  PLT 330 264  --   --    BMET Recent Labs    05/03/24 0625 05/05/24 0548  NA 137 135  K 4.0 3.7  CL 106 109  CO2 21* 22  GLUCOSE 144* 147*  BUN 20 10  CREATININE 1.20* 0.91  CALCIUM  9.6 8.4*   PT/INR Recent Labs    05/03/24 0625  LABPROT 13.3  INR 1.0   Assessment & Plan: 73 y.o. female with a right staghorn stone s/p right PCNL with Dr. Estanislao Heimlich 5/15.  Blood counts drifted down, roughly stable. Still having some pain/nausea  Plan: -ADAT -Daily CBC, BMP -Pain, nausea control -neph tube removed at bedside today.  -OOB to chair -receiving 2 units.   Mallie Seal, MD 05/05/2024

## 2024-05-06 DIAGNOSIS — N182 Chronic kidney disease, stage 2 (mild): Secondary | ICD-10-CM | POA: Diagnosis present

## 2024-05-06 DIAGNOSIS — J309 Allergic rhinitis, unspecified: Secondary | ICD-10-CM | POA: Diagnosis present

## 2024-05-06 DIAGNOSIS — Z7984 Long term (current) use of oral hypoglycemic drugs: Secondary | ICD-10-CM | POA: Diagnosis not present

## 2024-05-06 DIAGNOSIS — E7800 Pure hypercholesterolemia, unspecified: Secondary | ICD-10-CM | POA: Diagnosis present

## 2024-05-06 DIAGNOSIS — Z881 Allergy status to other antibiotic agents status: Secondary | ICD-10-CM | POA: Diagnosis not present

## 2024-05-06 DIAGNOSIS — R110 Nausea: Secondary | ICD-10-CM | POA: Diagnosis not present

## 2024-05-06 DIAGNOSIS — M810 Age-related osteoporosis without current pathological fracture: Secondary | ICD-10-CM | POA: Diagnosis present

## 2024-05-06 DIAGNOSIS — E1122 Type 2 diabetes mellitus with diabetic chronic kidney disease: Secondary | ICD-10-CM | POA: Diagnosis present

## 2024-05-06 DIAGNOSIS — Z8249 Family history of ischemic heart disease and other diseases of the circulatory system: Secondary | ICD-10-CM | POA: Diagnosis not present

## 2024-05-06 DIAGNOSIS — Z888 Allergy status to other drugs, medicaments and biological substances status: Secondary | ICD-10-CM | POA: Diagnosis not present

## 2024-05-06 DIAGNOSIS — Z90710 Acquired absence of both cervix and uterus: Secondary | ICD-10-CM | POA: Diagnosis not present

## 2024-05-06 DIAGNOSIS — N200 Calculus of kidney: Secondary | ICD-10-CM | POA: Diagnosis present

## 2024-05-06 DIAGNOSIS — Z800 Family history of malignant neoplasm of digestive organs: Secondary | ICD-10-CM | POA: Diagnosis not present

## 2024-05-06 DIAGNOSIS — Z885 Allergy status to narcotic agent status: Secondary | ICD-10-CM | POA: Diagnosis not present

## 2024-05-06 DIAGNOSIS — K219 Gastro-esophageal reflux disease without esophagitis: Secondary | ICD-10-CM | POA: Diagnosis present

## 2024-05-06 DIAGNOSIS — Z833 Family history of diabetes mellitus: Secondary | ICD-10-CM | POA: Diagnosis not present

## 2024-05-06 DIAGNOSIS — Z860100 Personal history of colon polyps, unspecified: Secondary | ICD-10-CM | POA: Diagnosis not present

## 2024-05-06 DIAGNOSIS — Z886 Allergy status to analgesic agent status: Secondary | ICD-10-CM | POA: Diagnosis not present

## 2024-05-06 DIAGNOSIS — N132 Hydronephrosis with renal and ureteral calculous obstruction: Secondary | ICD-10-CM | POA: Diagnosis present

## 2024-05-06 DIAGNOSIS — Z79899 Other long term (current) drug therapy: Secondary | ICD-10-CM | POA: Diagnosis not present

## 2024-05-06 DIAGNOSIS — E559 Vitamin D deficiency, unspecified: Secondary | ICD-10-CM | POA: Diagnosis present

## 2024-05-06 DIAGNOSIS — I4510 Unspecified right bundle-branch block: Secondary | ICD-10-CM | POA: Diagnosis present

## 2024-05-06 LAB — BASIC METABOLIC PANEL WITH GFR
Anion gap: 6 (ref 5–15)
BUN: 9 mg/dL (ref 8–23)
CO2: 24 mmol/L (ref 22–32)
Calcium: 8.8 mg/dL — ABNORMAL LOW (ref 8.9–10.3)
Chloride: 106 mmol/L (ref 98–111)
Creatinine, Ser: 0.91 mg/dL (ref 0.44–1.00)
GFR, Estimated: >60 mL/min (ref >60)
Glucose, Bld: 138 mg/dL — ABNORMAL HIGH (ref 70–99)
Potassium: 3.9 mmol/L (ref 3.5–5.1)
Sodium: 136 mmol/L (ref 135–145)

## 2024-05-06 LAB — TYPE AND SCREEN
ABO/RH(D): A POS
Antibody Screen: NEGATIVE
Unit division: 0
Unit division: 0

## 2024-05-06 LAB — BPAM RBC
Blood Product Expiration Date: 202506142359
Blood Product Expiration Date: 202506142359
ISSUE DATE / TIME: 202505171034
ISSUE DATE / TIME: 202505171403
Unit Type and Rh: 6200
Unit Type and Rh: 6200

## 2024-05-06 LAB — HEMOGLOBIN AND HEMATOCRIT, BLOOD
HCT: 31.8 % — ABNORMAL LOW (ref 36.0–46.0)
Hemoglobin: 10.5 g/dL — ABNORMAL LOW (ref 12.0–15.0)

## 2024-05-06 LAB — GLUCOSE, CAPILLARY: Glucose-Capillary: 240 mg/dL — ABNORMAL HIGH (ref 70–99)

## 2024-05-06 MED ORDER — TRAMADOL HCL 50 MG PO TABS: 25.0000 mg | ORAL_TABLET | Freq: Four times a day (QID) | ORAL | 0 refills | Status: AC | PRN

## 2024-05-06 MED ORDER — DOCUSATE SODIUM 100 MG PO CAPS: 100.0000 mg | ORAL_CAPSULE | Freq: Two times a day (BID) | ORAL | 0 refills | Status: DC

## 2024-05-06 NOTE — Progress Notes (Signed)
 IV removed. Dressing changed to right flank. Patient educated on how to change dressing. Dressing supplies given to the patient along with her discharge papers. Patient sent out by wheelchair with phone, belongings

## 2024-05-06 NOTE — Discharge Summary (Signed)
 Date of admission: 05/03/2024  Date of discharge: 05/06/2024  Admission diagnosis:  Right nephrolithiasis [N20.0] Staghorn kidney stones [N20.0]   Discharge diagnosis:  Right nephrolithiasis [N20.0] Staghorn kidney stones [N20.0]  Secondary diagnoses:   Active Ambulatory Problems    Diagnosis Date Noted   Hypercholesteremia 05/06/2015   Vitamin D  deficiency 04/14/2021   Type 2 diabetes mellitus with stage 2 chronic kidney disease, without long-term current use of insulin  (HCC) 02/22/2022   Age-related osteoporosis without current pathological fracture 02/22/2022   Tachycardia 02/22/2022   Encounter for screening mammogram for malignant neoplasm of breast 02/22/2022   Medicare annual wellness visit, subsequent 02/22/2022   Annual physical exam 02/22/2022   Gastroesophageal reflux disease with esophagitis without hemorrhage 02/22/2022   Hyperlipidemia associated with type 2 diabetes mellitus (HCC) 03/15/2022   Hyperlipidemia LDL goal <70 03/15/2022   Vertigo 07/12/2022   Lipoma of torso 07/12/2022   Chronic pain of left knee 12/07/2022   Screening mammogram for breast cancer 03/07/2023   Colon cancer screening 03/07/2023   CAP (community acquired pneumonia) 03/23/2024   Kidney stone 03/23/2024   Hydronephrosis of right kidney 03/23/2024   CKD stage G2/A2 03/23/2024   Microcytic anemia 03/23/2024   Heart failure with preserved ejection fraction, NYHA class I (HCC) 03/23/2024   Hiatal hernia 03/26/2024   Staghorn renal calculus 04/16/2024   Right bundle branch block (RBBB) on electrocardiogram (ECG) 04/16/2024   Resolved Ambulatory Problems    Diagnosis Date Noted   Allergic rhinitis 05/06/2015   Acid reflux 05/06/2015   History of colon polyps 05/06/2015   H/O renal calculi 05/06/2015   Irregular cardiac rhythm 05/06/2015   Backhand tennis elbow 05/06/2015   Neutropenia (HCC) 05/06/2015   Type 2 diabetes mellitus without complication, without long-term current use of  insulin  (HCC) 05/06/2015   Adenomatous polyp 05/06/2015   Abnormal lung sounds 10/29/2015   Rib pain on right side 10/29/2015   Near syncope 10/11/2018   Heart palpitations 10/11/2018   Dyspepsia 09/05/2018   Screening-pulmonary TB 07/15/2020   Weakness of both lower extremities 07/17/2021   Mass of soft tissue 12/18/2021   Strep pharyngitis 04/29/2023   Pink eye disease of left eye 04/29/2023   Mass of left parotid gland 06/21/2023   Excessive cerumen in left ear canal 06/21/2023   Swollen tonsil 06/21/2023   Sepsis (HCC) 03/23/2024   Acute respiratory failure with hypoxia (HCC) 03/23/2024   Past Medical History:  Diagnosis Date   Allergy    CHF (congestive heart failure) (HCC)    CKD (chronic kidney disease), stage II    DM (diabetes mellitus), type 2 (HCC)    GERD (gastroesophageal reflux disease)    History of hiatal hernia    Hyperlipidemia    Pneumonia 03/23/2024   RBBB (right bundle branch block)    Serum cholesterol elevated      History and Physical: For full details, please see admission history and physical. Briefly, Holly Matthews is a 73 y.o. year old patient who was admitted after R PCNL.   Hospital Course: Patient's blood counts dropped postoperative day 1.  Postoperative day 2 she received 2 units of PRBCs with appropriate response.  Her nephrostomy tube and Foley were removed and she was able to void spontaneously  On the day of discharge, the patient was tolerating a regular diet and their pain was well controlled. They were determined to be stable for discharge home  Exam No apparent distress, alert and oriented Regular rate and rhythm Abdomen soft, nondistended  Respirations clear nonlabored  Laboratory values:  Recent Labs    05/05/24 0548 05/05/24 1717 05/06/24 0516  HGB 6.6* 10.6* 10.5*  HCT 21.4* 32.8* 31.8*   Recent Labs    05/05/24 0548 05/06/24 0516  CREATININE 0.91 0.91    Disposition: Home  Discharge medications:  Allergies as  of 05/06/2024       Reactions   Eucalyptus Oil Shortness Of Breath   Codeine Other (See Comments)   Confusion, loopy    Doxycycline  Other (See Comments)   Diarrhea/ abdominal cramping   Tylenol  [acetaminophen ] Other (See Comments)   "Makes me feel crazy, lightheaded:        Medication List     STOP taking these medications    sulfamethoxazole -trimethoprim  800-160 MG tablet Commonly known as: BACTRIM  DS       TAKE these medications    Accu-Chek Guide test strip Generic drug: glucose blood USE UP TO FOUR TIMES DAILY AS INSTRUCTED   Accu-Chek Softclix Lancets lancets USE UP TO FOUR TIMES DAILY AS DIRECTED   blood glucose meter kit and supplies Dispense based on patient and insurance preference. Use up to four times daily as directed. (FOR ICD-10 E10.9, E11.9).   calcium -vitamin D  500-200 MG-UNIT Tabs tablet Commonly known as: OSCAL WITH D Take 1 tablet by mouth in the morning and at bedtime.   docusate sodium  100 MG capsule Commonly known as: COLACE Take 1 capsule (100 mg total) by mouth 2 (two) times daily.   metFORMIN  750 MG 24 hr tablet Commonly known as: GLUCOPHAGE -XR TAKE 1 TABLET BY MOUTH DAILY  WITH BREAKFAST   pantoprazole  40 MG tablet Commonly known as: PROTONIX  Take 1 tablet (40 mg total) by mouth 2 (two) times daily.   rosuvastatin  5 MG tablet Commonly known as: CRESTOR  Take 1 tablet (5 mg total) by mouth daily. What changed: when to take this   traMADol  50 MG tablet Commonly known as: Ultram  Take 0.5-1 tablets (25-50 mg total) by mouth every 6 (six) hours as needed for up to 5 days for severe pain (pain score 7-10).   Vitamin D3 125 MCG (5000 UT) Caps Take 5,000 Units by mouth daily.               Discharge Care Instructions  (From admission, onward)           Start     Ordered   05/06/24 0000  Change dressing (specify)       Comments: Dressing change as needed   05/06/24 4098            Followup:

## 2024-05-07 ENCOUNTER — Encounter: Admitting: Family Medicine

## 2024-05-07 LAB — GLUCOSE, CAPILLARY
Glucose-Capillary: 190 mg/dL — ABNORMAL HIGH (ref 70–99)
Glucose-Capillary: 199 mg/dL — ABNORMAL HIGH (ref 70–99)
Glucose-Capillary: 151 mg/dL — ABNORMAL HIGH (ref 70–99)
Glucose-Capillary: 177 mg/dL — ABNORMAL HIGH (ref 70–99)
Glucose-Capillary: 100 mg/dL — ABNORMAL HIGH (ref 70–99)
Glucose-Capillary: 189 mg/dL — ABNORMAL HIGH (ref 70–99)

## 2024-05-13 LAB — STONE ANALYSIS
Calcium Phosphate (Carbonate): 50 %
Struvite (MgNH4PO4 6H2O): 50 %
Weight Calculi: 2527 mg

## 2024-05-17 ENCOUNTER — Ambulatory Visit: Admitting: Urology

## 2024-05-17 VITALS — BP 124/72 | HR 99 | Ht 60.0 in

## 2024-05-17 DIAGNOSIS — N200 Calculus of kidney: Secondary | ICD-10-CM

## 2024-05-17 DIAGNOSIS — Z466 Encounter for fitting and adjustment of urinary device: Secondary | ICD-10-CM

## 2024-05-17 DIAGNOSIS — R399 Unspecified symptoms and signs involving the genitourinary system: Secondary | ICD-10-CM

## 2024-05-17 MED ORDER — SULFAMETHOXAZOLE-TRIMETHOPRIM 800-160 MG PO TABS
1.0000 | ORAL_TABLET | Freq: Two times a day (BID) | ORAL | 0 refills | Status: AC
Start: 2024-05-17 — End: 2024-05-20

## 2024-05-17 MED ORDER — LIDOCAINE HCL URETHRAL/MUCOSAL 2 % EX GEL
1.0000 | Freq: Once | CUTANEOUS | Status: AC
  Administered 2024-05-17: 1 via URETHRAL

## 2024-05-17 MED ORDER — SULFAMETHOXAZOLE-TRIMETHOPRIM 800-160 MG PO TABS
1.0000 | ORAL_TABLET | Freq: Once | ORAL | Status: AC
  Administered 2024-05-17: 1 via ORAL

## 2024-05-17 NOTE — Progress Notes (Signed)
 Cystoscopy Procedure Note:  Indication: Stent removal s/p 05/03/2024 right PCNL for 5 cm staghorn stone, residual 1 cm midpole stone unable to be accessed during PCNL  Bactrim  given for prophylaxis  After informed consent and discussion of the procedure and its risks, Holly Matthews was positioned and prepped in the standard fashion. Cystoscopy was performed with a flexible cystoscope. The stent was grasped with flexible graspers and removed in its entirety. The patient tolerated the procedure well.  Findings: Uncomplicated stent removal  Assessment and Plan: She opted for surveillance of her residual stone on the right side, risk of infection, obstruction, recurrence discussed  RTC 3 months KUB  Lawerence Pressman, MD 05/17/2024

## 2024-05-17 NOTE — Patient Instructions (Signed)
 Holly Matthews

## 2024-05-20 NOTE — Anesthesia Postprocedure Evaluation (Signed)
 Anesthesia Post Note  Patient: Holly Matthews  Procedure(s) Performed: NEPHROLITHOTOMY PERCUTANEOUS (Right)  Patient location during evaluation: PACU Anesthesia Type: General Level of consciousness: awake and alert Pain management: pain level controlled Vital Signs Assessment: post-procedure vital signs reviewed and stable Respiratory status: spontaneous breathing, nonlabored ventilation, respiratory function stable and patient connected to nasal cannula oxygen Cardiovascular status: blood pressure returned to baseline and stable Postop Assessment: no apparent nausea or vomiting Anesthetic complications: no   No notable events documented.   Last Vitals:  Vitals:   05/06/24 0212 05/06/24 1002  BP: 126/62 116/63  Pulse: 74 73  Resp: 16 18  Temp: 37 C 36.8 C  SpO2: 93% 92%    Last Pain:  Vitals:   05/06/24 1002  TempSrc:   PainSc: 0-No pain                 Vanice Genre

## 2024-05-21 ENCOUNTER — Telehealth: Payer: Self-pay | Admitting: Family Medicine

## 2024-05-21 NOTE — Telephone Encounter (Signed)
 Copied from CRM [id number]. Topic: Appointments - Scheduling Inquiry for Clinic >> May 21, 2024  9:35 AM [employer] wrote: Reason for CRM: Patient would like an appointment to follow up for he recent hospital visit in June. None available for June.  Callback #: [phone]    Please let me know when I can schedule her for an appt in June... Holly Matthews

## 2024-05-24 NOTE — Telephone Encounter (Signed)
 Appt made for 05/29/24 @ 10:00 a.m .

## 2024-05-29 ENCOUNTER — Encounter: Payer: Self-pay | Admitting: Family Medicine

## 2024-05-29 ENCOUNTER — Ambulatory Visit (INDEPENDENT_AMBULATORY_CARE_PROVIDER_SITE_OTHER): Admitting: Family Medicine

## 2024-05-29 VITALS — BP 106/63 | HR 96 | Ht 60.0 in | Wt 119.0 lb

## 2024-05-29 DIAGNOSIS — D509 Iron deficiency anemia, unspecified: Secondary | ICD-10-CM | POA: Diagnosis not present

## 2024-05-29 DIAGNOSIS — E1122 Type 2 diabetes mellitus with diabetic chronic kidney disease: Secondary | ICD-10-CM

## 2024-05-29 DIAGNOSIS — J9811 Atelectasis: Secondary | ICD-10-CM

## 2024-05-29 DIAGNOSIS — N182 Chronic kidney disease, stage 2 (mild): Secondary | ICD-10-CM | POA: Diagnosis not present

## 2024-05-29 DIAGNOSIS — Z09 Encounter for follow-up examination after completed treatment for conditions other than malignant neoplasm: Secondary | ICD-10-CM

## 2024-05-29 MED ORDER — ACCU-CHEK SOFTCLIX LANCETS MISC: 12 refills | Status: AC

## 2024-05-29 MED ORDER — BLOOD GLUCOSE TEST VI STRP: 1.0000 | ORAL_STRIP | Freq: Every day | 3 refills | Status: AC

## 2024-05-29 NOTE — Progress Notes (Signed)
 Established patient visit   Patient: Holly Matthews   DOB: May 06, 1951   73 y.o. Female  MRN: [id number] Visit Date: 05/29/2024  Today's healthcare provider: Carlean Charter, DO   Chief Complaint  Patient presents with   Hospitalization Follow-up    Patient here for hospital follow. Date of admission: 05/03/2024 Date of discharge: 05/06/2024 Admission/discharge diagnosis: Right Nephrolithiasis. Per patient low hemoglobin in hospital. Patient concerned.   Subjective    HPI Holly Matthews is a 73 year old female who presents with post-operative pain and concerns about low hemoglobin levels following kidney stone surgery.  She has been experiencing new pain in a different location since yesterday after her kidney stone removal surgery. The pain disrupts her sleep, causing her to move from the bed to the couch for comfort. She is uncertain if her sleeping position contributes to the discomfort.  She is concerned about her hemoglobin levels, which have been low since a previous hospitalization for pneumonia. Her hemoglobin dropped after her recent surgery, necessitating a blood transfusion. It has since increased to 10, but she remains worried as it is still low. She has not had her hemoglobin checked since last year and fears a chronic issue. She mentions difficulty eating well post-surgery, resulting in a weight loss of five to six pounds, primarily from her arms and legs. She is trying to improve her diet but is restricted from certain foods due to kidney stones.  She recalls a family history of a blood disorder in her father (which she was told was a "pre-leukemia"), possibly myelodysplastic syndrome or myelofibrosis she believes, which is concerning her. She plans to verify this information with her brother, who has access to her father's medical records.  She mentions her recent episode of pneumonia this past April, which cleared up, but her last chest x-ray showed atelectasis  and/for scarring. She is unsure if this is related to the pneumonia or something else and is concerned about this. She has not been exercising and does not have an incentive spirometer, which was not provided during her hospital stay.  She has been monitoring her blood glucose more frequently since her hospital stay and is running low on lancets and glucose strips. She uses an Accu-Check device and prefers to obtain supplies from [employer] rather than mail order.      Medications: Outpatient Medications Prior to Visit  Medication Sig   blood glucose meter kit and supplies Dispense based on patient and insurance preference. Use up to four times daily as directed. (FOR ICD-10 E10.9, E11.9).   calcium -vitamin D  (OSCAL WITH D) 500-200 MG-UNIT TABS tablet Take 1 tablet by mouth in the morning and at bedtime.   Cholecalciferol  (VITAMIN D3) 125 MCG (5000 UT) CAPS Take 5,000 Units by mouth daily.   metFORMIN  (GLUCOPHAGE -XR) 750 MG 24 hr tablet TAKE 1 TABLET BY MOUTH DAILY  WITH BREAKFAST   pantoprazole  (PROTONIX ) 40 MG tablet Take 1 tablet (40 mg total) by mouth 2 (two) times daily.   rosuvastatin  (CRESTOR ) 5 MG tablet Take 1 tablet (5 mg total) by mouth daily. (Patient taking differently: Take 5 mg by mouth every morning.)   [DISCONTINUED] Accu-Chek Softclix Lancets lancets USE UP TO FOUR TIMES DAILY AS DIRECTED   [DISCONTINUED] glucose blood (ACCU-CHEK GUIDE) test strip USE UP TO FOUR TIMES DAILY AS INSTRUCTED   ondansetron  (ZOFRAN ) 4 MG tablet Take 4 mg by mouth every 6 (six) hours as needed.   No facility-administered medications prior to  visit.       Objective    BP 106/63 (BP Location: Left Arm, Patient Position: Sitting, Cuff Size: Normal)   Pulse 96   Ht 5' (1.524 m)   Wt 119 lb (54 kg)   SpO2 97%   BMI 23.24 kg/m     Physical Exam Vitals and nursing note reviewed.  Constitutional:      General: She is not in acute distress.    Appearance: Normal appearance.  HENT:      Head: Normocephalic and atraumatic.  Eyes:     General: No scleral icterus.    Conjunctiva/sclera: Conjunctivae normal.  Cardiovascular:     Rate and Rhythm: Normal rate.  Pulmonary:     Effort: Pulmonary effort is normal.  Neurological:     Mental Status: She is alert and oriented to person, place, and time. Mental status is at baseline.  Psychiatric:        Mood and Affect: Mood normal.        Behavior: Behavior normal.      No results found for any visits on 05/29/24.  Assessment & Plan    Microcytic anemia -     CBC with Differential/Platelet  Hospital discharge follow-up  Type 2 diabetes mellitus with stage 2 chronic kidney disease, without long-term current use of insulin  (HCC) -     Accu-Chek Softclix Lancets; USE UP TO FOUR TIMES DAILY AS DIRECTED  Dispense: 100 each; Refill: 12 -     Blood Glucose Test; 1 each by In Vitro route daily before breakfast. May substitute to any manufacturer covered by patient's insurance.  Dispense: 100 strip; Refill: 3      Microcytic anemia Microcytic anemia with low hemoglobin post-surgery. Discussed slow recovery and dietary adjustments. - Recheck hemoglobin levels. - Encourage dietary intake of iron-rich foods such as beans and meats. - Consider iron supplementation with OTC ferrous sulfate or Slow Fe (latter to minimize constipation). - Monitor for symptoms of fatigue and adjust activity levels accordingly.  Atelectasis Atelectasis and/or scarring on chest x-ray post-pneumonia, likely related to pneumonia. Discussed deep breathing exercises and incentive spirometer use. - Encourage deep breathing exercises and consider purchasing an incentive spirometer. - Encourage regular exercise to improve lung function.  Type II diabetes mellitus Frequent blood glucose monitoring required due to fluctuations since hospitalization. - Prescribe lancets and glucose strips for Accu-Check device.  Hospital discharge follow-up; postoperative  pain New pain post-kidney stone removal, possibly due to sleeping position changes. - Adjust sleeping position as needed.   General Health Maintenance Due for Medicare annual wellness visit in July. Considering shingles vaccine and due for colonoscopy, previously postponed due to pneumonia. - Schedule Medicare annual wellness visit in July. - Discuss shingles vaccine at the next visit. - Encourage scheduling a colonoscopy.    Return in about 4 weeks (around 06/26/2024) for mAWV.      I discussed the assessment and treatment plan with the patient  The patient was provided an opportunity to ask questions and all were answered. The patient agreed with the plan and demonstrated an understanding of the instructions.   The patient was advised to call back or seek an in-person evaluation if the symptoms worsen or if the condition fails to improve as anticipated.    Carlean Charter, DO  [facility] [phone] (phone) [phone] (fax)  [facility]

## 2024-05-30 LAB — CBC WITH DIFFERENTIAL/PLATELET
Basophils Absolute: 0.1 10*3/uL (ref 0.0–0.2)
Basos: 1 %
EOS (ABSOLUTE): 0.2 10*3/uL (ref 0.0–0.4)
Eos: 4 %
Hematocrit: 38.3 % (ref 34.0–46.6)
Hemoglobin: 11.9 g/dL (ref 11.1–15.9)
Immature Grans (Abs): 0 10*3/uL (ref 0.0–0.1)
Immature Granulocytes: 0 %
Lymphocytes Absolute: 1.6 10*3/uL (ref 0.7–3.1)
Lymphs: 30 %
MCH: 26.7 pg (ref 26.6–33.0)
MCHC: 31.1 g/dL — ABNORMAL LOW (ref 31.5–35.7)
MCV: 86 fL (ref 79–97)
Monocytes Absolute: 0.7 10*3/uL (ref 0.1–0.9)
Monocytes: 12 %
Neutrophils Absolute: 2.8 10*3/uL (ref 1.4–7.0)
Neutrophils: 53 %
Platelets: 346 10*3/uL (ref 150–450)
RBC: 4.46 x10E6/uL (ref 3.77–5.28)
RDW: 17.1 % — ABNORMAL HIGH (ref 11.7–15.4)
WBC: 5.4 10*3/uL (ref 3.4–10.8)

## 2024-05-31 ENCOUNTER — Ambulatory Visit: Payer: Self-pay | Admitting: Family Medicine

## 2024-06-26 ENCOUNTER — Encounter: Payer: Self-pay | Admitting: Family Medicine

## 2024-06-26 ENCOUNTER — Ambulatory Visit: Admitting: Family Medicine

## 2024-06-26 VITALS — BP 111/68 | HR 78 | Resp 14 | Ht 60.0 in | Wt 119.9 lb

## 2024-06-26 DIAGNOSIS — G479 Sleep disorder, unspecified: Secondary | ICD-10-CM | POA: Diagnosis not present

## 2024-06-26 DIAGNOSIS — H9190 Unspecified hearing loss, unspecified ear: Secondary | ICD-10-CM

## 2024-06-26 DIAGNOSIS — D171 Benign lipomatous neoplasm of skin and subcutaneous tissue of trunk: Secondary | ICD-10-CM

## 2024-06-26 DIAGNOSIS — R002 Palpitations: Secondary | ICD-10-CM | POA: Diagnosis not present

## 2024-06-26 DIAGNOSIS — E1122 Type 2 diabetes mellitus with diabetic chronic kidney disease: Secondary | ICD-10-CM

## 2024-06-26 DIAGNOSIS — Z0001 Encounter for general adult medical examination with abnormal findings: Secondary | ICD-10-CM

## 2024-06-26 DIAGNOSIS — N182 Chronic kidney disease, stage 2 (mild): Secondary | ICD-10-CM | POA: Diagnosis not present

## 2024-06-26 DIAGNOSIS — Z0000 Encounter for general adult medical examination without abnormal findings: Secondary | ICD-10-CM

## 2024-06-26 NOTE — Progress Notes (Signed)
 Annual Wellness Visit     Patient: Holly Matthews, Female    DOB: 03-30-1951, 73 y.o.   MRN: [id number] Visit Date: 06/26/2024  Today's Provider: LAURAINE LOISE BUOY, DO   Chief Complaint  Patient presents with   Annual Exam    Sleeping pattern: not to good Exercising: not much since her surgery No concerns   Subjective    Holly Matthews is a 73 y.o. female who presents today for her Annual Wellness Visit. She reports consuming a general, low carbohydrate diet. The patient does not participate in regular exercise at present. She generally feels fairly well. She reports sleeping poorly. She does not have additional problems to discuss today.   HPI Holly Matthews is a 73 year old female who presents for a Medicare annual wellness visit.  She has been experiencing difficulty sleeping since her surgery, initially due to pain, but now due to a habit of waking up and moving between the bed and couch. Her sleep has improved somewhat over time.  She has not been exercising much since her surgery, attributing it to feeling 'lazy' and only recently beginning to feel like herself. She plans to resume activities like walking but gets tired quickly when doing household chores such as cleaning or mopping.  Her diet is reportedly 'pretty good,' though she notes her blood sugar is 'wobbly,' usually around 120, sometimes higher. She is trying to improve her sugar control.  She has experienced shingles in the past and has been hesitant to receive the shingles vaccine, fearing it might cause shingles again. It has been several years since her last episode.  She reports a little hearing loss but nothing major.  She mentions occasional chest pain, which she attributes to reflux, and palpitations that occur when she is anxious. No significant shortness of breath.  She has a lipoma on her back that has grown larger. She has not had one in another spot before.  Her ear has been hurting a little this  week, possibly due to wax or fluid behind the ear, but she has not had any recent allergy problems.    Medications: Outpatient Medications Prior to Visit  Medication Sig   Accu-Chek Softclix Lancets lancets USE UP TO FOUR TIMES DAILY AS DIRECTED   blood glucose meter kit and supplies Dispense based on patient and insurance preference. Use up to four times daily as directed. (FOR ICD-10 E10.9, E11.9).   calcium -vitamin D  (OSCAL WITH D) 500-200 MG-UNIT TABS tablet Take 1 tablet by mouth in the morning and at bedtime.   Cholecalciferol  (VITAMIN D3) 125 MCG (5000 UT) CAPS Take 5,000 Units by mouth daily.   Glucose Blood (BLOOD GLUCOSE TEST STRIPS) STRP 1 each by In Vitro route daily before breakfast. May substitute to any manufacturer covered by patient's insurance.   metFORMIN  (GLUCOPHAGE -XR) 750 MG 24 hr tablet TAKE 1 TABLET BY MOUTH DAILY  WITH BREAKFAST   pantoprazole  (PROTONIX ) 40 MG tablet Take 1 tablet (40 mg total) by mouth 2 (two) times daily.   rosuvastatin  (CRESTOR ) 5 MG tablet Take 1 tablet (5 mg total) by mouth daily. (Patient taking differently: Take 5 mg by mouth every morning.)   No facility-administered medications prior to visit.    Allergies  Allergen Reactions   Eucalyptus Oil Shortness Of Breath   Codeine Other (See Comments)    Confusion, loopy    Doxycycline  Other (See Comments)    Diarrhea/ abdominal cramping   Tylenol  [Acetaminophen ] Other (See Comments)  Makes me feel crazy, lightheaded:    Patient Care Team: Danylah Holden, Lauraine SAILOR, DO as PCP - [facility] (Family [facility]) [facility], [facility] (Optometry)  Review of Systems  Constitutional:  Negative for chills, fatigue and fever.  HENT:  Negative for congestion, rhinorrhea, sneezing and sore throat.   Eyes: Negative.  Negative for pain and redness.  Respiratory:  Negative for cough, shortness of breath and wheezing.   Cardiovascular:  Positive for palpitations (intermittent, when anxious.). Negative for chest  pain and leg swelling.  Gastrointestinal:  Negative for abdominal pain, blood in stool, constipation, diarrhea and nausea.  Endocrine: Negative for polydipsia and polyphagia.  Genitourinary: Negative.  Negative for dysuria, flank pain, hematuria, pelvic pain, vaginal bleeding and vaginal discharge.  Musculoskeletal:  Negative for arthralgias, back pain, gait problem and joint swelling.  Skin:  Negative for rash.  Neurological: Negative.  Negative for dizziness, tremors, seizures, weakness, light-headedness, numbness and headaches.  Hematological:  Negative for adenopathy.  Psychiatric/Behavioral: Negative.  Negative for behavioral problems, confusion and dysphoric mood. The patient is not nervous/anxious and is not hyperactive.          Objective    Vitals: BP 111/68 (BP Location: Right Arm, Patient Position: Sitting, Cuff Size: Normal)   Pulse 78   Resp 14   Ht 5' (1.524 m)   Wt 119 lb 14.4 oz (54.4 kg)   SpO2 98%   BMI 23.42 kg/m     Physical Exam Vitals and nursing note reviewed.  Constitutional:      General: She is awake.     Appearance: Normal appearance.  HENT:     Head: Normocephalic and atraumatic.     Right Ear: Tympanic membrane, ear canal and external ear normal.     Left Ear: Tympanic membrane, ear canal and external ear normal.     Nose: Nose normal.     Mouth/Throat:     Mouth: Mucous membranes are moist.     Pharynx: Oropharynx is clear. No oropharyngeal exudate or posterior oropharyngeal erythema.  Eyes:     General: No scleral icterus.    Extraocular Movements: Extraocular movements intact.     Conjunctiva/sclera: Conjunctivae normal.     Pupils: Pupils are equal, round, and reactive to light.  Neck:     Thyroid : No thyromegaly or thyroid  tenderness.  Cardiovascular:     Rate and Rhythm: Normal rate and regular rhythm.     Pulses: Normal pulses.     Heart sounds: Normal heart sounds.  Pulmonary:     Effort: Pulmonary effort is normal. No  tachypnea, bradypnea or respiratory distress.     Breath sounds: Normal breath sounds. No stridor. No wheezing, rhonchi or rales.  Abdominal:     General: Bowel sounds are normal. There is no distension.     Palpations: Abdomen is soft. There is no mass.     Tenderness: There is no abdominal tenderness. There is no guarding.     Hernia: No hernia is present.  Musculoskeletal:     Cervical back: Normal range of motion and neck supple.     Right lower leg: No edema.     Left lower leg: No edema.  Lymphadenopathy:     Cervical: No cervical adenopathy.  Skin:    General: Skin is warm and dry.  Neurological:     Mental Status: She is alert and oriented to person, place, and time. Mental status is at baseline.  Psychiatric:        Mood and  Affect: Mood normal.        Behavior: Behavior normal.     Most recent functional status assessment:    06/26/2024    9:09 AM  In your present state of health, do you have any difficulty performing the following activities:  Hearing? 0  Vision? 0  Difficulty concentrating or making decisions? 0  Walking or climbing stairs? 0  Dressing or bathing? 0  Doing errands, shopping? 0   Most recent fall risk assessment:    06/26/2024    8:58 AM  Fall Risk   Falls in the past year? 0  Number falls in past yr: 0  Injury with Fall? 0  Risk for fall due to : No Fall Risks    Most recent depression screenings:    06/26/2024    8:58 AM 04/16/2024    4:13 PM  PHQ 2/9 Scores  PHQ - 2 Score 0 0  PHQ- 9 Score 1 1   Most recent cognitive screening:    06/26/2024    9:06 AM  6CIT Screen  What Year? 0 points  What month? 0 points  What time? 0 points  Count back from 20 0 points  Months in reverse 0 points  Repeat phrase 0 points  Total Score 0 points   Most recent Audit-C alcohol use screening    06/26/2024    9:09 AM  Alcohol Use Disorder Test (AUDIT)  1. How often do you have a drink containing alcohol? 0  2. How many drinks containing alcohol  do you have on a typical day when you are drinking? 0  3. How often do you have six or more drinks on one occasion? 0  AUDIT-C Score 0   A score of 3 or more in women, and 4 or more in men indicates increased risk for alcohol abuse, EXCEPT if all of the points are from question 1   No results found for any visits on 06/26/24.  Assessment & Plan     Annual wellness visit done today including the all of the following: Reviewed patient's Family Medical History Reviewed and updated list of patient's medical providers Assessment of cognitive impairment was done Assessed patient's functional ability Established a written schedule for health screening services Health Risk Assessent Completed and Reviewed Patient not currently on any opioid medications.   Exercise Activities and Dietary recommendations  Goals      Exercise 3x per week (30 min per time)     Recommend to start back exercising for 3 days a week for 30-45 minutes.         Immunization History  Administered Date(s) Administered   Influenza Split 10/25/2011   Influenza, High Dose Seasonal PF 11/24/2016, 11/25/2017   Influenza,inj,Quad PF,6+ Mos 12/06/2018, 10/03/2019   Moderna Sars-Covid-2 Vaccination 01/02/2020, 01/30/2020   Pneumococcal Conjugate-13 11/24/2016   Pneumococcal Polysaccharide-23 05/16/2018   Tdap 06/07/2019    Health Maintenance  Topic Date Due   Zoster Vaccines- Shingrix (1 of 2) Never done   Colonoscopy  11/07/2023   COVID-19 Vaccine (3 - Moderna risk series) 09/19/2024 (Originally 02/27/2020)   INFLUENZA VACCINE  07/20/2024   FOOT EXAM  09/18/2024   HEMOGLOBIN A1C  09/28/2024   OPHTHALMOLOGY EXAM  10/24/2024   Diabetic kidney evaluation - Urine ACR  03/29/2025   Diabetic kidney evaluation - eGFR measurement  05/06/2025   MAMMOGRAM  05/23/2025   Medicare Annual Wellness (AWV)  06/26/2025   DTaP/Tdap/Td (2 - Td or Tdap) 06/06/2029   Pneumococcal Vaccine:  50+ Years  Completed   DEXA SCAN   Completed   Hepatitis C Screening  Completed   Hepatitis B Vaccines  Aged Out   HPV VACCINES  Aged Out   Meningococcal B Vaccine  Aged Out     Discussed health benefits of physical activity, and encouraged her to engage in regular exercise appropriate for her age and condition.    Medicare annual wellness visit, subsequent  Sleep disturbance  Type 2 diabetes mellitus with stage 2 chronic kidney disease, without long-term current use of insulin  (HCC)  Palpitations  Decreased hearing, unspecified laterality  Lipoma of torso     Medicare annual wellness visit, subsequent Discussed shingles vaccine, colon cancer screening, MMR status. Shingles vaccine is non-live, colon cancer screening delayed, likely immune to measles. - Recommend shingles vaccine series at pharmacy. - Ensure colon cancer screening is scheduled. - No MMR vaccination/titer needed unless exposure risk increases, as patient was born before [birth year] and is considered to be immune.  Sleep Disturbance Improved sleep disturbance post-surgery, now habitual waking.  Type 2 Diabetes Mellitus Fluctuating glucose levels, dietary improvements ongoing. - Schedule A1c test in three months.  Palpitations Palpitations linked to anxiety, no concerning symptoms.  Hearing Loss Mild hearing loss, not significant.  Lipoma of torso Soft, mobile lipoma, which she reports has mildly increased in size, cosmetic issue.  No acute concerns.    Return in about 3 months (around 09/26/2024) for DM, Chronic f/u.     I discussed the assessment and treatment plan with the patient  The patient was provided an opportunity to ask questions and all were answered. The patient agreed with the plan and demonstrated an understanding of the instructions.   The patient was advised to call back or seek an in-person evaluation if the symptoms worsen or if the condition fails to improve as anticipated.    LAURAINE LOISE BUOY, DO  [facility]  [facility] [phone] (phone) [phone] (fax)  [facility]

## 2024-06-26 NOTE — Patient Instructions (Signed)
 Schedule you colonoscopy and get your Shingles vaccine at the pharmacy.  Holly Matthews , Thank you for taking time to come for your Medicare Wellness Visit. I appreciate your ongoing commitment to your health goals. Please review the following plan we discussed and let me know if I can assist you in the future.   These are the goals we discussed:  Goals      Exercise 3x per week (30 min per time)     Recommend to start back exercising for 3 days a week for 30-45 minutes.         This is a list of the screening recommended for you and due dates:  Health Maintenance  Topic Date Due   Zoster (Shingles) Vaccine (1 of 2) Never done   Colon Cancer Screening  11/07/2023   COVID-19 Vaccine (3 - Moderna risk series) 09/19/2024*   Flu Shot  07/20/2024   Complete foot exam   09/18/2024   Hemoglobin A1C  09/28/2024   Eye exam for diabetics  10/24/2024   Yearly kidney health urinalysis for diabetes  03/29/2025   Yearly kidney function blood test for diabetes  05/06/2025   Mammogram  05/23/2025   Medicare Annual Wellness Visit  06/26/2025   DTaP/Tdap/Td vaccine (2 - Td or Tdap) 06/06/2029   Pneumococcal Vaccine for age over 60  Completed   DEXA scan (bone density measurement)  Completed   Hepatitis C Screening  Completed   Hepatitis B Vaccine  Aged Out   HPV Vaccine  Aged Out   Meningitis B Vaccine  Aged Out  *Topic was postponed. The date shown is not the original due date.

## 2024-07-30 ENCOUNTER — Ambulatory Visit: Admitting: Urology

## 2024-07-31 DIAGNOSIS — D485 Neoplasm of uncertain behavior of skin: Secondary | ICD-10-CM | POA: Diagnosis not present

## 2024-07-31 DIAGNOSIS — L820 Inflamed seborrheic keratosis: Secondary | ICD-10-CM | POA: Diagnosis not present

## 2024-07-31 DIAGNOSIS — D225 Melanocytic nevi of trunk: Secondary | ICD-10-CM | POA: Diagnosis not present

## 2024-07-31 DIAGNOSIS — L821 Other seborrheic keratosis: Secondary | ICD-10-CM | POA: Diagnosis not present

## 2024-08-02 ENCOUNTER — Ambulatory Visit
Admission: RE | Admit: 2024-08-02 | Discharge: 2024-08-02 | Disposition: A | Discharge status: Home / Self Care | Source: Ambulatory Visit | Attending: Urology | Admitting: Urology

## 2024-08-02 ENCOUNTER — Other Ambulatory Visit: Payer: Self-pay

## 2024-08-02 ENCOUNTER — Ambulatory Visit
Admission: RE | Admit: 2024-08-02 | Discharge: 2024-08-02 | Disposition: A | Discharge status: Home / Self Care | Attending: Urology | Admitting: Urology

## 2024-08-02 ENCOUNTER — Ambulatory Visit: Admitting: Urology

## 2024-08-02 VITALS — BP 114/69 | HR 91 | Wt 120.0 lb

## 2024-08-02 DIAGNOSIS — Z87442 Personal history of urinary calculi: Secondary | ICD-10-CM | POA: Diagnosis not present

## 2024-08-02 DIAGNOSIS — N200 Calculus of kidney: Secondary | ICD-10-CM

## 2024-08-02 DIAGNOSIS — I878 Other specified disorders of veins: Secondary | ICD-10-CM | POA: Diagnosis not present

## 2024-08-02 NOTE — Progress Notes (Unsigned)
 Surgical Physician Order Form [facility]   Dr. Redell Burnet, MD  * Scheduling expectation : Patient prefers 4- 6 weeks  *Length of Case: 1.5 hours  *Clearance needed: no  *Anticoagulation Instructions: May continue all anticoagulants  *Aspirin Instructions: Ok to continue all  *Post-op visit Date/Instructions: 1 week stent removal  *Diagnosis: Right Nephrolithiasis  *Procedure: right Ureteroscopy w/laser lithotripsy & stent placement (47643)   Additional orders: N/A  -Admit type: OUTpatient  -Anesthesia: General  -VTE Prophylaxis Standing Order SCD's       Other:   -Standing Lab Orders Per Anesthesia    Lab other: UA&Urine Culture  -Standing Test orders EKG/Chest x-ray per Anesthesia       Test other:   - Medications:  Cipro  400mg  IV  -Other orders:  N/A

## 2024-08-02 NOTE — Patient Instructions (Signed)
 Laser Therapy for Kidney Stones Laser therapy for kidney stones is a procedure to break up rock-like masses that form inside the kidneys (kidney stones). It is done using a device that beams a strong light (laser) on the kidney stones. This breaks the stones up into small pieces. These small pieces may leave your body when you pee (urinate) or may be taken out during the procedure.  You may need laser therapy if you have kidney stones that are painful or that are stopping you from being able to pee. Tell a health care provider about: Any allergies you have. All medicines you are taking, including vitamins, herbs, eye drops, creams, and over-the-counter medicines. Any problems you or family members have had with anesthesia. Any bleeding problems you have. Any surgeries you have had. Any medical conditions you have. Whether you are pregnant or may be pregnant. What are the risks? Your health care provider will talk with you about risks. These may include: Infection. Bleeding. Allergic reactions to medicines. Damage to: The part of your body that drains pee (urine) from the bladder (urethra). The bladder. The tube that connects the bladder to the kidneys (ureter). Urinary tract infection (UTI). Urethral stricture. This is when the urethra is narrowed by scarring. Trouble peeing. Blockage of the kidney. This may be caused by a piece of kidney stone. What happens before the procedure? When to stop eating and drinking Follow instructions from your provider about what you may eat and drink. These may include: 8 hours before the procedure Stop eating most foods. Do not eat meat, fried foods, or fatty foods. Eat only light foods, such as toast or crackers. All liquids are okay except energy drinks and alcohol . 6 hours before the procedure Stop eating. Drink only clear liquids, such as water, clear fruit juice, black coffee, plain tea, and sports drinks. Do not drink energy drinks or  alcohol . 2 hours before the procedure Stop drinking all liquids. You may be allowed to take medicines with small sips of water. If you do not follow your provider's instructions, your procedure may be delayed or canceled. Medicines Ask your provider about: Changing or stopping your regular medicines. These include any diabetes medicines or blood thinners you take. Taking medicines such as aspirin  and ibuprofen . These medicines can thin your blood. Do not take them unless your provider tells you to. Taking over-the-counter medicines, vitamins, herbs, and supplements. Tests You may have a physical exam before the procedure. You may also have tests done. These may include: Imaging tests. Blood or pee tests. Surgery safety Ask your provider: How your surgery site will be marked. What steps will be taken to help prevent infection. These steps may include: Removing hair at the surgery site. Washing skin with a soap that kills germs. Taking antibiotics. General instructions Do not use any products that contain nicotine  or tobacco for at least 4 weeks before the procedure. These products include cigarettes, chewing tobacco, and vaping devices, such as e-cigarettes. If you need help quitting, ask your provider. If you will be going home right after the procedure, plan to have a responsible adult: Take you home from the hospital or clinic. You will not be allowed to drive. Care for you for the time you are told. What happens during the procedure?  An IV will be inserted into one of your veins. You will be given: A sedative. This helps you relax. Anesthesia. This keeps you from feeling pain. It will make you fall asleep for surgery. A tool  with a camera on the end (ureteroscope) will be put into your urethra. It will be moved through your bladder to your kidney. It will send pictures to a screen in the operating room. This will show what parts of your kidney need to be treated. A tube will be  put through the ureteroscope. It will be moved into your kidney. The laser device will be put into your kidney through the tube. The laser will be used to break up the kidney stones. A tool with a tiny wire basket may be put through the tube into your kidney. This can help remove the small pieces of the kidney stone. A small mesh tube (stent) may be placed to allow your kidney to drain. The tube and ureteroscope will be taken out at the end of the surgery. The procedure may vary among providers and hospitals. What happens after the procedure? Your blood pressure, heart rate, breathing rate, and blood oxygen  level will be monitored until you leave the hospital or clinic. If you had a stent placed, it may have a string that will be secured to your skin. This helps your provider remove the stent. You may be given a strainer to collect any stone pieces that you pass in your pee. Your provider may have these tested. This information is not intended to replace advice given to you by your health care provider. Make sure you discuss any questions you have with your health care provider. Document Revised: 08/06/2022 Document Reviewed: 08/06/2022 Elsevier Patient Education  2024 [employer].

## 2024-08-02 NOTE — Progress Notes (Signed)
   08/02/2024 2:25 PM   Holly Matthews November 21, 1951 [id number]  Reason for visit: Follow up nephrolithiasis  HPI: 73 year old female who underwent right PCNL on 05/03/2024 for a 5 cm complete staghorn stone.  Unable to access a 1.5 cm stone in the midpole.  She has done well since surgery, denies any urinary symptoms, UTIs, or flank pain.  Stone analysis showed 50% struvite 50% calcium  oxalate  I personally viewed and interpreted KUB today that shows a stable 1.5 cm fragment in the midpole  We discussed options including observation or definitive treatment with ureteroscopy, laser lithotripsy, stent placement.  With her history of struvite stones I recommended definitive treatment with ureteroscopy.  Risks and benefits were discussed. We specifically discussed the risks ureteroscopy including bleeding, infection/sepsis, stent related symptoms including flank pain/urgency/frequency/incontinence/dysuria, ureteral injury, ureteral stricture, inability to access stone, or need for staged or additional procedures.  Schedule right ureteroscopy, laser lithotripsy, stent placement  Redell JAYSON Burnet, MD  [facility] [address] [place], [place] [address] [phone]

## 2024-08-06 ENCOUNTER — Telehealth: Payer: Self-pay

## 2024-08-06 NOTE — Progress Notes (Signed)
   [facility] Surgical Posting Form  Surgery Date: Date: 09/10/2024  Surgeon: Dr. Redell Burnet, MD   Inpt ( No  )   Outpt (Yes)   Obs ( No  )   Diagnosis: N20.0 Right Nephrolithiasis  -CPT: [phone]  Surgery: Right Ureteroscopy with Laser Lithotripsy and Stent Placement  Stop Anticoagulations: No, may continue all  Cardiac/Medical/Pulmonary Clearance needed: No  *Orders entered into EPIC  Date: 08/06/24   *Case booked in [place]  Date: 08/03/2024  *Notified pt of Surgery: Date: 08/03/2024  PRE-OP UA & CX: Yes, will obtain in clinic on 08/28/2024  *Placed into Prior Authorization Work [place] Date: 08/06/24  Assistant/laser/rep:No

## 2024-08-06 NOTE — Telephone Encounter (Signed)
 Per Dr. Francisca Patient is to be scheduled for Right Ureteroscopy with Laser Lithotripsy and Stent Placement   Mrs. Igo was contacted and possible surgical dates were discussed, Monday September 22nd, 2025 was agreed upon for surgery.   Patient was instructed that Dr. Francisca will require them to provide a pre-op UA & CX prior to surgery. This was ordered and scheduled drop off appointment was made for 08/28/2024.    Patient was directed to call [phone] between 1-3pm the day before surgery to find out surgical arrival time.  Instructions were given not to eat or drink from midnight on the night before surgery and have a driver for the day of surgery. On the surgery day patient was instructed to enter through the Medical Mall entrance of [facility] report the Same Day Surgery desk.   Pre-Admit Testing will be in contact via phone to set up an interview with the anesthesia team to review your history and medications prior to surgery.   Reminder of this information was sent via MyChart to the patient.

## 2024-08-16 ENCOUNTER — Ambulatory Visit: Admitting: Urology

## 2024-08-28 ENCOUNTER — Other Ambulatory Visit

## 2024-08-28 DIAGNOSIS — N200 Calculus of kidney: Secondary | ICD-10-CM

## 2024-08-29 LAB — URINALYSIS, COMPLETE
Bilirubin, UA: NEGATIVE
Glucose, UA: NEGATIVE
Ketones, UA: NEGATIVE
Nitrite, UA: NEGATIVE
Protein,UA: NEGATIVE
Specific Gravity, UA: 1.03 (ref 1.005–1.030)
Urobilinogen, Ur: 0.2 mg/dL (ref 0.2–1.0)
pH, UA: 6 (ref 5.0–7.5)

## 2024-08-29 LAB — MICROSCOPIC EXAMINATION: WBC, UA: >30 /HPF — AB (ref 0–5)

## 2024-08-31 ENCOUNTER — Encounter: Payer: Self-pay | Admitting: Urgent Care

## 2024-08-31 ENCOUNTER — Other Ambulatory Visit: Payer: Self-pay

## 2024-08-31 ENCOUNTER — Encounter: Payer: Self-pay | Admitting: Urology

## 2024-08-31 ENCOUNTER — Encounter
Admission: RE | Admit: 2024-08-31 | Discharge: 2024-08-31 | Disposition: A | Discharge status: Home / Self Care | Source: Ambulatory Visit | Attending: Urology | Admitting: Urology

## 2024-08-31 DIAGNOSIS — I4510 Unspecified right bundle-branch block: Secondary | ICD-10-CM | POA: Insufficient documentation

## 2024-08-31 DIAGNOSIS — K2100 Gastro-esophageal reflux disease with esophagitis, without bleeding: Secondary | ICD-10-CM | POA: Diagnosis not present

## 2024-08-31 DIAGNOSIS — E1122 Type 2 diabetes mellitus with diabetic chronic kidney disease: Secondary | ICD-10-CM | POA: Diagnosis not present

## 2024-08-31 DIAGNOSIS — Z01810 Encounter for preprocedural cardiovascular examination: Secondary | ICD-10-CM | POA: Diagnosis present

## 2024-08-31 DIAGNOSIS — Z01812 Encounter for preprocedural laboratory examination: Secondary | ICD-10-CM | POA: Diagnosis present

## 2024-08-31 DIAGNOSIS — N182 Chronic kidney disease, stage 2 (mild): Secondary | ICD-10-CM | POA: Diagnosis not present

## 2024-08-31 DIAGNOSIS — N200 Calculus of kidney: Secondary | ICD-10-CM | POA: Insufficient documentation

## 2024-08-31 DIAGNOSIS — Z01818 Encounter for other preprocedural examination: Secondary | ICD-10-CM | POA: Insufficient documentation

## 2024-08-31 HISTORY — DX: Personal history of urinary calculi: Z87.442

## 2024-08-31 LAB — BASIC METABOLIC PANEL WITH GFR
Anion gap: 10 (ref 5–15)
BUN: 15 mg/dL (ref 8–23)
CO2: 22 mmol/L (ref 22–32)
Calcium: 9.3 mg/dL (ref 8.9–10.3)
Chloride: 107 mmol/L (ref 98–111)
Creatinine, Ser: 1.03 mg/dL — ABNORMAL HIGH (ref 0.44–1.00)
GFR, Estimated: 57 mL/min — ABNORMAL LOW (ref >60)
Glucose, Bld: 193 mg/dL — ABNORMAL HIGH (ref 70–99)
Potassium: 3.6 mmol/L (ref 3.5–5.1)
Sodium: 139 mmol/L (ref 135–145)

## 2024-08-31 LAB — CBC
HCT: 33.1 % — ABNORMAL LOW (ref 36.0–46.0)
Hemoglobin: 10.9 g/dL — ABNORMAL LOW (ref 12.0–15.0)
MCH: 28.7 pg (ref 26.0–34.0)
MCHC: 32.9 g/dL (ref 30.0–36.0)
MCV: 87.1 fL (ref 80.0–100.0)
Platelets: 207 K/uL (ref 150–400)
RBC: 3.8 MIL/uL — ABNORMAL LOW (ref 3.87–5.11)
RDW: 14.6 % (ref 11.5–15.5)
WBC: 5.2 K/uL (ref 4.0–10.5)
nRBC: 0 % (ref 0.0–0.2)

## 2024-08-31 NOTE — Patient Instructions (Addendum)
 Your procedure is scheduled on: 09/10/2024  Report to the Registration Desk on the 1st floor of the Medical Mall. To find out your arrival time, please call [phone] between 1PM - 3PM on: 9/19 , Friday If your arrival time is 6:00 am, do not arrive before that time as the Medical Mall entrance doors do not open until 6:00 am.  REMEMBER: Instructions that are not followed completely may result in serious medical risk, up to and including death; or upon the discretion of your surgeon and anesthesiologist your surgery may need to be rescheduled.  Do not eat food after midnight the night before surgery.  No gum chewing or hard candies.  You may however, drink CLEAR liquids up to 2 hours before you are scheduled to arrive for your surgery. Do not drink anything within 2 hours of your scheduled arrival time.  Clear liquids include: -sips of water  only    One week prior to surgery: Stop Anti-inflammatories (NSAIDS) such as Advil , Aleve, Ibuprofen , Motrin , Naproxen, Naprosyn and Aspirin based products such as Excedrin, Goody's Powder, BC Powder. Stop ANY OVER THE COUNTER supplements until after surgery like calcium  and any vitamins ....    Hold metformin  two days before surgery. Last dose  9/19   Continue taking all of your other prescription medications up until the day of surgery.  ON THE DAY OF SURGERY ONLY TAKE THESE MEDICATIONS WITH SIPS OF WATER :  pantoprazole  (PROTONIX ) -very important rosuvastatin  (CRESTOR )     No Alcohol for 24 hours before or after surgery.  No Smoking including e-cigarettes for 24 hours before surgery.  No chewable tobacco products for at least 6 hours before surgery.  No nicotine patches on the day of surgery.  Do not use any recreational drugs for at least a week (preferably 2 weeks) before your surgery.  Please be advised that the combination of cocaine and anesthesia may have negative outcomes, up to and including death. If you test positive  for cocaine, your surgery will be cancelled.  On the morning of surgery brush your teeth with toothpaste and water , you may rinse your mouth with mouthwash if you wish. Do not swallow any toothpaste or mouthwash.  You may shower on day of surgery.  Do not wear jewelry, make-up, hairpins, clips or nail polish.   Do not wear lotions, powders, or perfumes., creams, deodorant   Do not shave body hair from the neck down 48 hours before surgery.  Contact lenses, hearing aids and dentures may not be worn into surgery.  Do not bring valuables to the hospital. [facility] is not responsible for any missing/lost belongings or valuables.     Notify your doctor if there is any change in your medical condition (cold, fever, infection).  Wear comfortable clothing (specific to your surgery type) to the hospital.  After surgery, you can help prevent lung complications by doing breathing exercises.  Take deep breaths and cough every 1-2 hours. Your doctor may order a device called an Incentive Spirometer to help you take deep breaths.   If you are being discharged the day of surgery, you will not be allowed to drive home. You will need a responsible individual to drive you home and stay with you for 24 hours after surgery.     Please call the Pre-admissions Testing Dept. at [phone] if you have any questions about these instructions.  Surgery Visitation Policy:  Patients having surgery or a procedure may have two visitors.  Children under the age  of 16 must have an adult with them who is not the patient.   Merchandiser, retail to address health-related social needs:  [email or web address]

## 2024-09-04 ENCOUNTER — Ambulatory Visit: Admitting: Physician Assistant

## 2024-09-04 ENCOUNTER — Encounter: Payer: Self-pay | Admitting: Physician Assistant

## 2024-09-04 VITALS — BP 109/68 | HR 96 | Temp 98.1°F | Resp 16 | Ht 60.0 in | Wt 122.9 lb

## 2024-09-04 DIAGNOSIS — D509 Iron deficiency anemia, unspecified: Secondary | ICD-10-CM

## 2024-09-04 DIAGNOSIS — N182 Chronic kidney disease, stage 2 (mild): Secondary | ICD-10-CM

## 2024-09-04 DIAGNOSIS — E1122 Type 2 diabetes mellitus with diabetic chronic kidney disease: Secondary | ICD-10-CM

## 2024-09-04 DIAGNOSIS — N200 Calculus of kidney: Secondary | ICD-10-CM | POA: Diagnosis not present

## 2024-09-04 NOTE — Progress Notes (Signed)
 " Established patient visit  Patient: Holly Matthews   DOB: [birth year]   73 y.o. Female  MRN: [id number] Visit Date: 09/04/2024  Today's healthcare provider: Jolynn Spencer, PA-C   Chief Complaint  Patient presents with   Cough    Onset Saturday. Complains of cough, fever Sunday night of 99.4 Has surgery scheduled on Monday and wants to make sure nothing to worry, has had pneumonia before. At home covid test negative on sunday   Subjective     HPI     Cough    Additional comments: Onset Saturday. Complains of cough, fever Sunday night of 99.4 Has surgery scheduled on Monday and wants to make sure nothing to worry, has had pneumonia before. At home covid test negative on sunday      Last edited by Wilfred Hargis RAMAN, CMA on 09/04/2024  4:09 PM.       Discussed the use of AI scribe software for clinical note transcription with the patient, who gave verbal consent to proceed.  History of Present Illness Holly Matthews is a 73 year old female with chronic anemia and recent pneumonia who presents with symptoms of a viral upper respiratory infection.  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 She experiences a sore throat and some eye discharge. There is no ear pain or discharge and no fever.  She had pneumonia a few months ago, raising concern about her current respiratory symptoms.  She is scheduled for a cystoscopy and urethroscopy on Monday to remove a remaining kidney stone and has been advised to avoid Advil  before the procedure.  She has chronic anemia, with a recent hemoglobin level of 10.9, slightly lower than a previous reading of 11. She has not been taking iron supplements as her levels had improved previously. She had blood transfusions during her last surgery due to bleeding.       06/26/2024    8:58 AM 04/16/2024    4:13 PM 06/21/2023    8:55  AM  Depression screen PHQ 2/9  Decreased Interest 0 0 0  Down, Depressed, Hopeless 0  0  PHQ - 2 Score 0 0 0  Altered sleeping 1 0 0  Tired, decreased energy 0 1 0  Change in appetite 0 0 0  Feeling bad or failure about yourself  0 0 0  Trouble concentrating 0 0 0  Moving slowly or fidgety/restless 0 0 0  Suicidal thoughts 0 0 0  PHQ-9 Score 1 1 0  Difficult doing work/chores Not difficult at all  Not difficult at all      06/26/2024    8:58 AM 04/16/2024    4:13 PM  GAD 7 : Generalized Anxiety Score  Nervous, Anxious, on Edge 0 0  Control/stop worrying 0 0  Worry too much - different things 0 0  Trouble relaxing 0 0  Restless 0 0  Easily annoyed or irritable 0 0  Afraid - awful might happen 0 0  Total GAD 7 Score 0 0  Anxiety Difficulty  Not difficult at all    Medications: Outpatient Medications Prior to Visit  Medication Sig   Accu-Chek Softclix Lancets lancets USE UP TO FOUR TIMES DAILY AS DIRECTED   blood glucose meter kit and supplies Dispense based on patient and insurance preference. Use up to four times daily as directed. (FOR ICD-10 E10.9, E11.9).  calcium -vitamin D  (OSCAL WITH D) 500-200 MG-UNIT TABS tablet Take 1 tablet by mouth in the morning and at bedtime.   Glucose Blood (BLOOD GLUCOSE TEST STRIPS) STRP 1 each by In Vitro route daily before breakfast. May substitute to any manufacturer covered by patient's insurance.   metFORMIN  (GLUCOPHAGE -XR) 750 MG 24 hr tablet TAKE 1 TABLET BY MOUTH DAILY  WITH BREAKFAST   pantoprazole  (PROTONIX ) 40 MG tablet Take 1 tablet (40 mg total) by mouth 2 (two) times daily.   rosuvastatin  (CRESTOR ) 5 MG tablet Take 1 tablet (5 mg total) by mouth daily.   No facility-administered medications prior to visit.    Review of Systems All negative Except see HPI       Objective    BP 109/68   Pulse 96   Temp 98.1 F (36.7 C) (Oral)   Resp 16   Ht 5' (1.524 m)   Wt 122 lb 14.4 oz (55.7 kg)   SpO2 98%   BMI 24.00 kg/m     The 10-year ASCVD risk score (Arnett DK, et al., 2019) is: 17.2%  Physical Exam Vitals reviewed.  Constitutional:      Appearance: She is normal weight.  HENT:     Head: Normocephalic and atraumatic.     Right Ear: Ear canal and external ear normal.     Left Ear: Ear canal and external ear normal.     Nose: Congestion and rhinorrhea present.     Mouth/Throat:     Pharynx: Posterior oropharyngeal erythema present.     Comments: Postnasal drainage noted Eyes:     General: No scleral icterus.       Right eye: No discharge.        Left eye: No discharge.     Extraocular Movements: Extraocular movements intact.     Pupils: Pupils are equal, round, and reactive to light.  Cardiovascular:     Rate and Rhythm: Normal rate and regular rhythm.  Pulmonary:     Effort: Pulmonary effort is normal.     Breath sounds: Normal breath sounds.  Abdominal:     General: Abdomen is flat. Bowel sounds are normal.     Palpations: Abdomen is soft.  Lymphadenopathy:     Cervical: No cervical adenopathy.  Neurological:     Mental Status: She is alert.      No results found for any visits on 09/04/24.       Assessment & Plan Acute upper respiratory viral infection Viral infection with nasal congestion, cough, and sore throat. No fever, lungs clear.  - Recommend Allegra, Claritin, or Zyrtec for symptoms. - Use nasal saline spray with Flonase or Nasacort. - Encourage hydration and warm salt gargles. - Suggest Mucinex  without DC for cough. - Advise Vicks VapoRub and steam inhalation. - Repeat COVID and flu tests if symptoms persist or worsen. RTC if symptoms persist  Nephrolithiasis, pending surgical removal Scheduled for cystoscopy and ureteroscopy, laser lithotripsy. Concerns about viral infection and anemia affecting surgery. Avoid Advil . - Communicate with surgical team about viral infection and anemia. - Avoid Advil  and NSAIDs. Will follow-up  Chronic anemia Chronic anemia with  hemoglobin 10.9, slightly decreased. Concern about impact on surgery. - Communicate with surgical team about hemoglobin levels and surgery impact. - Discuss hemoglobin drop with surgical team. Will follow-up  Type 2 diabetes mellitus Chronic condition affecting healing and immune response, relevant to current viral infection. Continue current regimen/metformin  750 Continue lifestyle modifications Will follow-up  Follow-up Coordinate management of conditions and  surgical procedure. - Message surgical team about hemoglobin and viral infection. - Follow up on surgery appropriateness given current health.   No orders of the defined types were placed in this encounter.   No follow-ups on file.   The patient was advised to call back or seek an in-person evaluation if the symptoms worsen or if the condition fails to improve as anticipated.  I discussed the assessment and treatment plan with the patient. The patient was provided an opportunity to ask questions and all were answered. The patient agreed with the plan and demonstrated an understanding of the instructions.  I, Moksha Dorgan, PA-C have reviewed all documentation for this visit. The documentation on 09/04/2024  for the exam, diagnosis, procedures, and orders are all accurate and complete.  Jolynn Spencer, [facility], MMS [facility] [phone] (phone) [phone] (fax)  [facility] "

## 2024-09-05 ENCOUNTER — Ambulatory Visit: Admitting: Family Medicine

## 2024-09-05 ENCOUNTER — Ambulatory Visit: Admitting: Physician Assistant

## 2024-09-05 ENCOUNTER — Ambulatory Visit: Payer: Self-pay | Admitting: Urology

## 2024-09-05 LAB — CULTURE, URINE COMPREHENSIVE

## 2024-09-05 MED ORDER — AMOXICILLIN 875 MG PO TABS: 875.0000 mg | ORAL_TABLET | Freq: Two times a day (BID) | ORAL | 0 refills | Status: DC

## 2024-09-10 DIAGNOSIS — E1122 Type 2 diabetes mellitus with diabetic chronic kidney disease: Secondary | ICD-10-CM

## 2024-09-10 DIAGNOSIS — N200 Calculus of kidney: Secondary | ICD-10-CM

## 2024-09-10 DIAGNOSIS — K2100 Gastro-esophageal reflux disease with esophagitis, without bleeding: Secondary | ICD-10-CM

## 2024-09-10 DIAGNOSIS — Z01812 Encounter for preprocedural laboratory examination: Secondary | ICD-10-CM

## 2024-09-10 HISTORY — DX: Calculus of kidney: N20.0

## 2024-09-16 MED ORDER — CHLORHEXIDINE GLUCONATE 0.12 % MT SOLN
15.0000 mL | Freq: Once | OROMUCOSAL | Status: AC
Start: 2024-09-16 — End: 2024-09-17
  Administered 2024-09-17: 15 mL via OROMUCOSAL

## 2024-09-16 MED ORDER — ORAL CARE MOUTH RINSE: 15.0000 mL | Freq: Once | OROMUCOSAL | Status: AC

## 2024-09-16 MED ORDER — SODIUM CHLORIDE 0.9 % IV SOLN: INTRAVENOUS | Status: DC

## 2024-09-16 MED ORDER — CIPROFLOXACIN IN D5W 400 MG/200ML IV SOLN
400.0000 mg | INTRAVENOUS | Status: AC
  Administered 2024-09-17: 400 mg via INTRAVENOUS

## 2024-09-17 ENCOUNTER — Ambulatory Visit

## 2024-09-17 ENCOUNTER — Ambulatory Visit
Admission: RE | Admit: 2024-09-17 | Discharge: 2024-09-17 | Disposition: A | Discharge status: Home / Self Care | Attending: Urology | Admitting: Urology

## 2024-09-17 ENCOUNTER — Encounter
Admission: RE | Disposition: A | Discharge status: Home / Self Care | Payer: Self-pay | Source: Home / Self Care | Attending: Urology

## 2024-09-17 ENCOUNTER — Encounter: Payer: Self-pay | Admitting: Urology

## 2024-09-17 ENCOUNTER — Other Ambulatory Visit: Payer: Self-pay

## 2024-09-17 DIAGNOSIS — N200 Calculus of kidney: Secondary | ICD-10-CM | POA: Diagnosis not present

## 2024-09-17 DIAGNOSIS — Z01812 Encounter for preprocedural laboratory examination: Secondary | ICD-10-CM

## 2024-09-17 DIAGNOSIS — I4891 Unspecified atrial fibrillation: Secondary | ICD-10-CM | POA: Diagnosis not present

## 2024-09-17 DIAGNOSIS — E1122 Type 2 diabetes mellitus with diabetic chronic kidney disease: Secondary | ICD-10-CM

## 2024-09-17 DIAGNOSIS — K2100 Gastro-esophageal reflux disease with esophagitis, without bleeding: Secondary | ICD-10-CM

## 2024-09-17 HISTORY — PX: CYSTOSCOPY/URETEROSCOPY/HOLMIUM LASER/STENT PLACEMENT: SHX6546

## 2024-09-17 LAB — GLUCOSE, CAPILLARY
Glucose-Capillary: 128 mg/dL — ABNORMAL HIGH (ref 70–99)
Glucose-Capillary: 126 mg/dL — ABNORMAL HIGH (ref 70–99)

## 2024-09-17 SURGERY — CYSTOSCOPY/URETEROSCOPY/HOLMIUM LASER/STENT PLACEMENT
Anesthesia: General | Site: Ureter | Laterality: Right

## 2024-09-17 MED ORDER — PROPOFOL 10 MG/ML IV BOLUS
INTRAVENOUS | Status: AC
Start: 2024-09-17 — End: 2024-09-17
  Filled 2024-09-17: qty 20

## 2024-09-17 MED ORDER — ONDANSETRON HCL 4 MG/2ML IJ SOLN
INTRAMUSCULAR | Status: DC | PRN
  Administered 2024-09-17: 4 mg via INTRAVENOUS

## 2024-09-17 MED ORDER — OXYCODONE HCL 5 MG/5ML PO SOLN: 5.0000 mg | Freq: Once | ORAL | Status: DC | PRN

## 2024-09-17 MED ORDER — SUGAMMADEX SODIUM 200 MG/2ML IV SOLN
INTRAVENOUS | Status: DC | PRN
  Administered 2024-09-17: 200 mg via INTRAVENOUS

## 2024-09-17 MED ORDER — OXYCODONE HCL 5 MG PO TABS: 5.0000 mg | ORAL_TABLET | Freq: Once | ORAL | Status: DC | PRN

## 2024-09-17 MED ORDER — PROPOFOL 10 MG/ML IV BOLUS
INTRAVENOUS | Status: DC | PRN
  Administered 2024-09-17: 150 mg via INTRAVENOUS

## 2024-09-17 MED ORDER — CIPROFLOXACIN IN D5W 400 MG/200ML IV SOLN
INTRAVENOUS | Status: AC
  Filled 2024-09-17: qty 200

## 2024-09-17 MED ORDER — STERILE WATER FOR IRRIGATION IR SOLN
Status: DC | PRN
  Administered 2024-09-17: 500 mL

## 2024-09-17 MED ORDER — PHENYLEPHRINE 80 MCG/ML (10ML) SYRINGE FOR IV PUSH (FOR BLOOD PRESSURE SUPPORT)
PREFILLED_SYRINGE | INTRAVENOUS | Status: DC | PRN
  Administered 2024-09-17 (×2): 80 ug via INTRAVENOUS

## 2024-09-17 MED ORDER — FENTANYL CITRATE (PF) 100 MCG/2ML IJ SOLN
INTRAMUSCULAR | Status: AC
  Filled 2024-09-17: qty 2

## 2024-09-17 MED ORDER — DEXAMETHASONE SODIUM PHOSPHATE 10 MG/ML IJ SOLN
INTRAMUSCULAR | Status: DC | PRN
  Administered 2024-09-17: 4 mg via INTRAVENOUS

## 2024-09-17 MED ORDER — CHLORHEXIDINE GLUCONATE 0.12 % MT SOLN
OROMUCOSAL | Status: AC
Start: 2024-09-17 — End: 2024-09-17
  Filled 2024-09-17: qty 15

## 2024-09-17 MED ORDER — SODIUM CHLORIDE 0.9 % IR SOLN
Status: DC | PRN
  Administered 2024-09-17: 3000 mL

## 2024-09-17 MED ORDER — FENTANYL CITRATE (PF) 100 MCG/2ML IJ SOLN
INTRAMUSCULAR | Status: DC | PRN
  Administered 2024-09-17 (×2): 50 ug via INTRAVENOUS

## 2024-09-17 MED ORDER — DROPERIDOL 2.5 MG/ML IJ SOLN
0.6250 mg | Freq: Once | INTRAMUSCULAR | Status: AC | PRN
  Administered 2024-09-17: 0.625 mg via INTRAVENOUS

## 2024-09-17 MED ORDER — DROPERIDOL 2.5 MG/ML IJ SOLN
INTRAMUSCULAR | Status: AC
Start: 2024-09-17 — End: 2024-09-17
  Filled 2024-09-17: qty 2

## 2024-09-17 MED ORDER — TRAMADOL HCL 50 MG PO TABS: 25.0000 mg | ORAL_TABLET | Freq: Four times a day (QID) | ORAL | 0 refills | Status: AC | PRN

## 2024-09-17 MED ORDER — LIDOCAINE HCL (CARDIAC) PF 100 MG/5ML IV SOSY
PREFILLED_SYRINGE | INTRAVENOUS | Status: DC | PRN
  Administered 2024-09-17: 50 mg via INTRAVENOUS

## 2024-09-17 MED ORDER — MIDAZOLAM HCL 2 MG/2ML IJ SOLN
INTRAMUSCULAR | Status: AC
  Filled 2024-09-17: qty 2

## 2024-09-17 MED ORDER — LIDOCAINE HCL URETHRAL/MUCOSAL 2 % EX GEL
CUTANEOUS | Status: AC
  Filled 2024-09-17: qty 6

## 2024-09-17 MED ORDER — KETOROLAC TROMETHAMINE 30 MG/ML IJ SOLN
INTRAMUSCULAR | Status: DC | PRN
  Administered 2024-09-17: 15 mg via INTRAVENOUS

## 2024-09-17 MED ORDER — ACETAMINOPHEN 10 MG/ML IV SOLN: 1000.0000 mg | Freq: Once | INTRAVENOUS | Status: DC | PRN

## 2024-09-17 MED ORDER — ROCURONIUM BROMIDE 100 MG/10ML IV SOLN
INTRAVENOUS | Status: DC | PRN
  Administered 2024-09-17: 30 mg via INTRAVENOUS

## 2024-09-17 MED ORDER — MIDAZOLAM HCL 2 MG/2ML IJ SOLN
INTRAMUSCULAR | Status: DC | PRN
  Administered 2024-09-17: 2 mg via INTRAVENOUS

## 2024-09-17 MED ORDER — IOHEXOL 180 MG/ML  SOLN
INTRAMUSCULAR | Status: DC | PRN
  Administered 2024-09-17: 10 mL

## 2024-09-17 MED ORDER — FENTANYL CITRATE (PF) 100 MCG/2ML IJ SOLN: 25.0000 ug | INTRAMUSCULAR | Status: DC | PRN

## 2024-09-17 SURGICAL SUPPLY — 24 items
ADHESIVE MASTISOL STRL (MISCELLANEOUS) IMPLANT
BAG DRAIN SIEMENS DORNER NS (MISCELLANEOUS) ×1 IMPLANT
BRUSH SCRUB EZ 4% CHG (MISCELLANEOUS) ×1 IMPLANT
CATH URET FLEX-TIP 2 LUMEN 10F (CATHETERS) IMPLANT
CATH URETL OPEN 5X70 (CATHETERS) IMPLANT
DRAPE UTILITY 15X26 TOWEL STRL (DRAPES) ×1 IMPLANT
DRSG TEGADERM 2-3/8X2-3/4 SM (GAUZE/BANDAGES/DRESSINGS) IMPLANT
FIBER LASER MOSES 365 DFL (Laser) IMPLANT
GOWN STRL REUS W/ TWL LRG LVL3 (GOWN DISPOSABLE) ×1 IMPLANT
GOWN STRL REUS W/ TWL XL LVL3 (GOWN DISPOSABLE) ×1 IMPLANT
GUIDEWIRE STR DUAL SENSOR (WIRE) ×1 IMPLANT
GUIDEWIRE STRT TIP .038X150X3 (WIRE) IMPLANT
KIT TURNOVER CYSTO (KITS) ×1 IMPLANT
PACK CYSTO AR (MISCELLANEOUS) ×1 IMPLANT
SET CYSTO W/LG BORE CLAMP LF (SET/KITS/TRAYS/PACK) ×1 IMPLANT
SHEATH NAVIGATOR HD 12/14X36 (SHEATH) IMPLANT
SOL .9 NS 3000ML IRR UROMATIC (IV SOLUTION) ×1 IMPLANT
STENT URET 6FRX22 CONTOUR (STENTS) IMPLANT
STENT URET 6FRX24 CONTOUR (STENTS) IMPLANT
STENT URET 6FRX26 CONTOUR (STENTS) IMPLANT
SURGILUBE 2OZ TUBE FLIPTOP (MISCELLANEOUS) ×1 IMPLANT
SYR 10ML LL (SYRINGE) ×1 IMPLANT
VALVE UROSEAL ADJ ENDO (VALVE) IMPLANT
WATER STERILE IRR 500ML POUR (IV SOLUTION) ×1 IMPLANT

## 2024-09-17 NOTE — Anesthesia Procedure Notes (Addendum)
 Procedure Name: Intubation Date/Time: 09/17/2024 10:14 AM  Performed by: Myra Lawless, CRNAPre-anesthesia Checklist: Patient identified, Patient being monitored, Timeout performed, Emergency Drugs available and Suction available Patient Re-evaluated:Patient Re-evaluated prior to induction Oxygen Delivery Method: Circle system utilized Preoxygenation: Pre-oxygenation with 100% oxygen Induction Type: IV induction Ventilation: Mask ventilation without difficulty Laryngoscope Size: Mac and 3 Grade View: Grade I Tube type: Oral Tube size: 7.0 mm Number of attempts: 1 Airway Equipment and Method: Stylet Placement Confirmation: ETT inserted through vocal cords under direct vision, positive ETCO2 and breath sounds checked- equal and bilateral Secured at: 21 cm Tube secured with: Tape Dental Injury: Teeth and Oropharynx as per pre-operative assessment

## 2024-09-17 NOTE — Op Note (Signed)
 Date of procedure: 09/17/24  Preoperative diagnosis:  Right renal stone  Postoperative diagnosis:  Same  Procedure: Cystoscopy, right ureteroscopy, laser lithotripsy, right retrograde pyelogram with intraoperative interpretation, right ureteral stent placement  Surgeon: Redell Burnet, MD  Anesthesia: General  Complications: None  Intraoperative findings:  Normal bladder Uncomplicated dusting of 1.5 cm soft right midpole stone and stent placement  EBL: Minimal  Specimens: None  Drains: Right 6 French by 22 cm ureteral stent  Indication: Holly Matthews is a 73 y.o. patient with prior history of complete right staghorn stone who previously underwent PCNL in May 2022, residual 1.5 cm midpole stone unable to be accessed antegrade and she opted for ureteroscopy for definitive management.  After reviewing the management options for treatment, they elected to proceed with the above surgical procedure(s). We have discussed the potential benefits and risks of the procedure, side effects of the proposed treatment, the likelihood of the patient achieving the goals of the procedure, and any potential problems that might occur during the procedure or recuperation. Informed consent has been obtained.  Description of procedure:  The patient was taken to the operating room and general anesthesia was induced. SCDs were placed for DVT prophylaxis.. The patient was placed in the dorsal lithotomy position, prepped and draped in the usual sterile fashion, and preoperative antibiotics were administered. A preoperative time-out was performed.   A 21 French rigid cystoscope was used to intubate the urethra and thorough cystoscopy was performed.  The bladder was grossly normal throughout, and ureteral orifices orthotopic bilaterally.  A sensor wire advanced easily in the right ureteral orifice and passed up to the kidney under fluoroscopic vision.  There was some tortuosity of the right proximal ureter.  A  8/10 French dual-lumen ureteral access catheter was advanced over the wire to the proximal ureter and a second safety sensor wire added.  A 12/14 French by 35 cm ureteral access sheath was then gently advanced over the wire to the proximal ureter.  A digital single-channel flexible ureteroscope was advanced through the sheath and thorough cystoscopy showed a dilated collecting system with a 1.5 cm midpole stone.  A 365 m laser fiber on settings of 0.5 and 80 Hz were used to methodically dust the stone.  The stone was very soft and dusted nicely.  Thorough pyeloscopy revealed no other stone fragments larger than the laser fiber.  A retrograde pyelogram performed from the proximal ureter showed no extravasation or filling defects.  Careful pullback ureteroscopy showed no evidence of ureteral injury.  The sheath was removed, and a 6 [place] by 22cm ureteral stent was placed fluoroscopically with a curl in the renal pelvis as well as in the bladder.  The bladder was drained and this concluded procedure.  Disposition: Stable to PACU  Plan: Schedule stent removal in 1 week  Redell Burnet, MD

## 2024-09-17 NOTE — Transfer of Care (Signed)
 Immediate Anesthesia Transfer of Care Note  Patient: Holly Matthews  Procedure(s) Performed: CYSTOSCOPY/URETEROSCOPY/HOLMIUM LASER/STENT PLACEMENT (Right: Ureter)  Patient Location: PACU  Anesthesia Type:General  Level of Consciousness: awake, alert , oriented, and patient cooperative  Airway & Oxygen Therapy: Patient Spontanous Breathing and Patient connected to face mask oxygen  Post-op Assessment: Report given to RN, Post -op Vital signs reviewed and stable, and Patient moving all extremities X 4  Post vital signs: Reviewed and stable  Last Vitals:  Vitals Value Taken Time  BP 111/63 09/17/24 11:00  Temp 36.4 C 09/17/24 11:00  Pulse 74 09/17/24 11:00  Resp 14 09/17/24 11:00  SpO2 94 % 09/17/24 11:00  Vitals shown include unfiled device data.  Last Pain:  Vitals:   09/17/24 0923  TempSrc: Temporal  PainSc: 0-No pain         Complications: No notable events documented.

## 2024-09-17 NOTE — Anesthesia Preprocedure Evaluation (Signed)
 Anesthesia Evaluation  Patient identified by MRN, date of birth, ID band Patient awake    Reviewed: Allergy & Precautions, H&P , NPO status , Patient's Chart, lab work & pertinent test results, reviewed documented beta blocker date and time   Airway Mallampati: II  TM Distance: >3 FB Neck ROM: full    Dental  (+) Teeth Intact   Pulmonary pneumonia   Pulmonary exam normal        Cardiovascular Exercise Tolerance: Poor +CHF  (-) Orthopnea and (-) PND + dysrhythmias Atrial Fibrillation  Rhythm:regular Rate:Normal     Neuro/Psych  Neuromuscular disease  negative psych ROS   GI/Hepatic Neg liver ROS, hiatal hernia,GERD  Medicated,,  Endo/Other  negative endocrine ROSdiabetes    Renal/GU CRFRenal disease  negative genitourinary   Musculoskeletal   Abdominal   Peds  Hematology  (+) Blood dyscrasia, anemia , neg HIV  Anesthesia Other Findings Past Medical History: 03/23/2024: Acute respiratory failure with hypoxia (HCC) 05/06/2015: Adenomatous polyp     Comment:  Had repeat colonoscopy 2014.    Repeat in 5 years. No date: Allergy No date: CHF (congestive heart failure) (HCC) No date: CKD (chronic kidney disease), stage II No date: DM (diabetes mellitus), type 2 (HCC) No date: GERD (gastroesophageal reflux disease) 05/06/2015: History of colon polyps No date: History of hiatal hernia No date: History of kidney stones No date: Hydronephrosis of right kidney No date: Hyperlipidemia No date: Microcytic anemia 03/23/2024: Pneumonia No date: RBBB (right bundle branch block) No date: Right nephrolithiasis 03/23/2024: Sepsis (HCC) No date: Serum cholesterol elevated No date: Staghorn renal calculus No date: Vitamin D  deficiency Past Surgical History: 1980: ABDOMINAL HYSTERECTOMY 1967: APPENDECTOMY No date: COLONOSCOPY 11/06/2018: COLONOSCOPY WITH PROPOFOL ; N/A     Comment:  Procedure: COLONOSCOPY WITH PROPOFOL ;   Surgeon:               Gaylyn Gladis PENNER, MD;  Location: [facility];                Service: Endoscopy;  Laterality: N/A; No date: ESOPHAGOGASTRODUODENOSCOPY 11/06/2018: ESOPHAGOGASTRODUODENOSCOPY (EGD) WITH PROPOFOL ; N/A     Comment:  Procedure: ESOPHAGOGASTRODUODENOSCOPY (EGD) WITH               PROPOFOL ;  Surgeon: Gaylyn Gladis PENNER, MD;  Location:               [facility];  Service: Endoscopy;  Laterality: N/A; 05/03/2024: IR NEPHROURETERAL CATH PLACE RIGHT 05/03/2024: NEPHROLITHOTOMY; Right     Comment:  Procedure: NEPHROLITHOTOMY PERCUTANEOUS;  Surgeon:               Francisca Redell BROCKS, MD;  Location: [facility];  Service:               Urology;  Laterality: Right; BMI    Body Mass Index: 24.00 kg/m     Reproductive/Obstetrics negative OB ROS                              Anesthesia Physical Anesthesia Plan  ASA: 3  Anesthesia Plan: General ETT   Post-op Pain Management:    Induction:   PONV Risk Score and Plan: 4 or greater  Airway Management Planned:   Additional Equipment:   Intra-op Plan:   Post-operative Plan:   Informed Consent: I have reviewed the patients History and Physical, chart, labs and discussed the procedure including the risks, benefits and alternatives for the proposed anesthesia with the patient  or authorized representative who has indicated his/her understanding and acceptance.     Dental Advisory Given  Plan Discussed with: CRNA  Anesthesia Plan Comments:         Anesthesia Quick Evaluation

## 2024-09-17 NOTE — H&P (Signed)
 09/17/24 9:54 AM   Holly Matthews [birth year] [id number]  CC: Right renal stone  HPI: 73 year old female with history of right staghorn stone status post PCNL May 2025, residual 1 cm right midpole stone that was unable to be accessed antegrade and she opted for definitive treatment with ureteroscopy.   PMH: Past Medical History:  Diagnosis Date   Acute respiratory failure with hypoxia (HCC) 03/23/2024   Adenomatous polyp 05/06/2015   Had repeat colonoscopy 2014.    Repeat in 5 years.   Allergy    CHF (congestive heart failure) (HCC)    CKD (chronic kidney disease), stage II    DM (diabetes mellitus), type 2 (HCC)    GERD (gastroesophageal reflux disease)    History of colon polyps 05/06/2015   History of hiatal hernia    History of kidney stones    Hydronephrosis of right kidney    Hyperlipidemia    Microcytic anemia    Pneumonia 03/23/2024   RBBB (right bundle branch block)    Right nephrolithiasis    Sepsis (HCC) 03/23/2024   Serum cholesterol elevated    Staghorn renal calculus    Vitamin D  deficiency     Surgical History: Past Surgical History:  Procedure Laterality Date   ABDOMINAL HYSTERECTOMY  1980   APPENDECTOMY  1967   COLONOSCOPY     COLONOSCOPY WITH PROPOFOL  N/A 11/06/2018   Procedure: COLONOSCOPY WITH PROPOFOL ;  Surgeon: Gaylyn Gladis PENNER, MD;  Location: [facility];  Service: Endoscopy;  Laterality: N/A;   ESOPHAGOGASTRODUODENOSCOPY     ESOPHAGOGASTRODUODENOSCOPY (EGD) WITH PROPOFOL  N/A 11/06/2018   Procedure: ESOPHAGOGASTRODUODENOSCOPY (EGD) WITH PROPOFOL ;  Surgeon: Gaylyn Gladis PENNER, MD;  Location: [facility] ENDOSCOPY;  Service: Endoscopy;  Laterality: N/A;   IR NEPHROURETERAL CATH PLACE RIGHT  05/03/2024   NEPHROLITHOTOMY Right 05/03/2024   Procedure: NEPHROLITHOTOMY PERCUTANEOUS;  Surgeon: Francisca Redell BROCKS, MD;  Location: [facility] OR[facility];  Service: Urology;  Laterality: Right;     Family History: Family History  Problem Relation Age of Onset    Hypertension Father    CVA Father    Healthy Sister    Healthy Sister    Diabetes Brother    Healthy Brother    Diabetes Son    Colon cancer Maternal Aunt    Breast cancer Neg Hx    Ovarian cancer Neg Hx     Social History:  reports that she has never smoked. She has never used smokeless tobacco. She reports that she does not drink alcohol and does not use drugs.  Physical Exam: BP 125/76   Pulse 79   Temp 97.9 F (36.6 C) (Temporal)   Resp 18   Ht 5' (1.524 m)   Wt 55.7 kg   SpO2 96%   BMI 24.00 kg/m    Constitutional:  Alert and oriented, No acute distress. Cardiovascular: Regular rate and rhythm Respiratory: Clear to auscultation bilaterally GI: Abdomen is soft, nontender, nondistended, no abdominal masses  Laboratory Data: Preop culture 08/28/2024 with E. coli, treated with culture appropriate antibiotics  Assessment & Plan:   73 year old female with prior right complete staghorn stone status post PCNL May 2025, residual 1 cm right midpole stone unable to be accessed antegrade and she opted for ureteroscopy.  We specifically discussed the risks ureteroscopy including bleeding, infection/sepsis, stent related symptoms including flank pain/urgency/frequency/incontinence/dysuria, ureteral injury, ureteral stricture, inability to access stone, or need for staged or additional procedures.  Right ureteroscopy, laser lithotripsy, stent placement  Redell Francisca, MD 09/17/2024  [facility] [phone]  [address] [place], [place] [address] [phone]

## 2024-09-18 ENCOUNTER — Encounter: Admitting: Urology

## 2024-09-18 ENCOUNTER — Encounter: Payer: Self-pay | Admitting: Urology

## 2024-09-18 NOTE — Anesthesia Postprocedure Evaluation (Signed)
 Anesthesia Post Note  Patient: Holly Matthews  Procedure(s) Performed: CYSTOSCOPY/URETEROSCOPY/HOLMIUM LASER/STENT PLACEMENT (Right: Ureter)  Patient location during evaluation: PACU Anesthesia Type: General Level of consciousness: awake and alert Pain management: pain level controlled Vital Signs Assessment: post-procedure vital signs reviewed and stable Respiratory status: spontaneous breathing, nonlabored ventilation, respiratory function stable and patient connected to nasal cannula oxygen Cardiovascular status: blood pressure returned to baseline and stable Postop Assessment: no apparent nausea or vomiting Anesthetic complications: no   No notable events documented.   Last Vitals:  Vitals:   09/17/24 1130 09/17/24 1157  BP: 105/67 111/65  Pulse: 69 72  Resp: 16 15  Temp:  36.6 C  SpO2: 94% 94%    Last Pain:  Vitals:   09/17/24 1157  TempSrc: Temporal  PainSc: 0-No pain                 Lynwood KANDICE Clause

## 2024-09-25 ENCOUNTER — Ambulatory Visit (INDEPENDENT_AMBULATORY_CARE_PROVIDER_SITE_OTHER): Admitting: Urology

## 2024-09-25 VITALS — BP 122/72 | HR 82

## 2024-09-25 DIAGNOSIS — N200 Calculus of kidney: Secondary | ICD-10-CM

## 2024-09-25 DIAGNOSIS — Z466 Encounter for fitting and adjustment of urinary device: Secondary | ICD-10-CM

## 2024-09-25 MED ORDER — LIDOCAINE HCL URETHRAL/MUCOSAL 2 % EX GEL
1.0000 | Freq: Once | CUTANEOUS | Status: AC
  Administered 2024-09-25: 1 via URETHRAL

## 2024-09-25 MED ORDER — SULFAMETHOXAZOLE-TRIMETHOPRIM 800-160 MG PO TABS
1.0000 | ORAL_TABLET | Freq: Once | ORAL | Status: AC
  Administered 2024-09-25: 1 via ORAL

## 2024-09-25 NOTE — Progress Notes (Signed)
 Cystoscopy Procedure Note:  Indication: Stent removal s/p 09/17/2024 right ureteroscopy, laser lithotripsy, stent placement  Bactrim  given for prophylaxis  After informed consent and discussion of the procedure and its risks, Elenie Coven Terra was positioned and prepped in the standard fashion. Cystoscopy was performed with a flexible cystoscope. The stent was grasped with flexible graspers and removed in its entirety. The patient tolerated the procedure well.  Findings: Uncomplicated stent removal  Assessment and Plan: We discussed general stone prevention strategies including adequate hydration with goal of producing 2.5 L of urine daily, increasing citric acid intake, increasing calcium  intake during high oxalate meals, minimizing animal protein, and decreasing salt intake. Information about dietary recommendations given today.   RTC 6 months KUB prior  Redell JAYSON Burnet, MD 09/25/2024

## 2024-09-25 NOTE — Addendum Note (Signed)
 Addended by: ELOUISE SANTA BROCKS on: 09/25/2024 01:22 PM   Modules accepted: Orders

## 2024-09-25 NOTE — Patient Instructions (Signed)
 Holly Matthews

## 2024-09-27 DIAGNOSIS — D235 Other benign neoplasm of skin of trunk: Secondary | ICD-10-CM | POA: Diagnosis not present

## 2024-09-27 DIAGNOSIS — D225 Melanocytic nevi of trunk: Secondary | ICD-10-CM | POA: Diagnosis not present

## 2024-10-06 ENCOUNTER — Other Ambulatory Visit: Payer: Self-pay | Admitting: Family Medicine

## 2024-10-06 DIAGNOSIS — K2100 Gastro-esophageal reflux disease with esophagitis, without bleeding: Secondary | ICD-10-CM

## 2024-10-15 DIAGNOSIS — L905 Scar conditions and fibrosis of skin: Secondary | ICD-10-CM | POA: Diagnosis not present

## 2024-10-15 DIAGNOSIS — L570 Actinic keratosis: Secondary | ICD-10-CM | POA: Diagnosis not present

## 2024-10-15 DIAGNOSIS — L821 Other seborrheic keratosis: Secondary | ICD-10-CM | POA: Diagnosis not present

## 2024-11-12 LAB — OPHTHALMOLOGY REPORT-SCANNED

## 2024-11-25 ENCOUNTER — Other Ambulatory Visit: Payer: Self-pay | Admitting: Family Medicine

## 2025-01-09 ENCOUNTER — Other Ambulatory Visit: Payer: Self-pay | Admitting: Family Medicine

## 2025-01-09 DIAGNOSIS — E1169 Type 2 diabetes mellitus with other specified complication: Secondary | ICD-10-CM

## 2025-01-09 DIAGNOSIS — E785 Hyperlipidemia, unspecified: Secondary | ICD-10-CM

## 2025-02-04 ENCOUNTER — Ambulatory Visit: Admitting: Family Medicine

## 2025-03-26 ENCOUNTER — Ambulatory Visit: Admitting: Urology
